# Patient Record
Sex: Female | Born: 1960 | Race: White | Hispanic: No | Smoking: Never smoker
Health system: Southern US, Community
[De-identification: ages and names within clinical notes are randomized; demographics above are authoritative.]

## PROBLEM LIST (undated history)

## (undated) ENCOUNTER — Emergency Department (HOSPITAL_COMMUNITY): Discharge status: Against Medical Advice | Payer: Self-pay

## (undated) DIAGNOSIS — D649 Anemia, unspecified: Secondary | ICD-10-CM

## (undated) DIAGNOSIS — K219 Gastro-esophageal reflux disease without esophagitis: Secondary | ICD-10-CM

## (undated) DIAGNOSIS — Z5189 Encounter for other specified aftercare: Secondary | ICD-10-CM

## (undated) DIAGNOSIS — R55 Syncope and collapse: Secondary | ICD-10-CM

## (undated) DIAGNOSIS — M1990 Unspecified osteoarthritis, unspecified site: Secondary | ICD-10-CM

## (undated) DIAGNOSIS — T7840XA Allergy, unspecified, initial encounter: Secondary | ICD-10-CM

## (undated) DIAGNOSIS — J45909 Unspecified asthma, uncomplicated: Secondary | ICD-10-CM

## (undated) DIAGNOSIS — G2581 Restless legs syndrome: Secondary | ICD-10-CM

## (undated) DIAGNOSIS — Z87442 Personal history of urinary calculi: Secondary | ICD-10-CM

## (undated) DIAGNOSIS — G43909 Migraine, unspecified, not intractable, without status migrainosus: Secondary | ICD-10-CM

## (undated) DIAGNOSIS — M7989 Other specified soft tissue disorders: Secondary | ICD-10-CM

## (undated) DIAGNOSIS — N200 Calculus of kidney: Secondary | ICD-10-CM

## (undated) HISTORY — DX: Anemia, unspecified: D64.9

## (undated) HISTORY — DX: Syncope and collapse: R55

## (undated) HISTORY — DX: Allergy, unspecified, initial encounter: T78.40XA

## (undated) HISTORY — DX: Unspecified asthma, uncomplicated: J45.909

## (undated) HISTORY — DX: Encounter for other specified aftercare: Z51.89

## (undated) HISTORY — PX: OTHER SURGICAL HISTORY: SHX169

## (undated) HISTORY — PX: MULTIPLE TOOTH EXTRACTIONS: SHX2053

## (undated) HISTORY — PX: COLONOSCOPY: SHX174

## (undated) HISTORY — PX: TUBAL LIGATION: SHX77

---

## 2002-06-01 ENCOUNTER — Encounter: Payer: Self-pay | Admitting: Emergency Medicine

## 2002-06-01 ENCOUNTER — Emergency Department (HOSPITAL_COMMUNITY): Admission: EM | Admit: 2002-06-01 | Discharge: 2002-06-01 | Payer: Self-pay | Admitting: Emergency Medicine

## 2006-06-18 ENCOUNTER — Ambulatory Visit (HOSPITAL_COMMUNITY): Admission: RE | Admit: 2006-06-18 | Discharge: 2006-06-18 | Payer: Self-pay | Admitting: Obstetrics & Gynecology

## 2012-10-22 ENCOUNTER — Observation Stay (HOSPITAL_COMMUNITY): Payer: Self-pay

## 2012-10-22 ENCOUNTER — Observation Stay (HOSPITAL_COMMUNITY)
Admission: EM | Admit: 2012-10-22 | Discharge: 2012-10-23 | Disposition: A | Discharge status: Home / Self Care | Payer: MEDICAID | Attending: Internal Medicine | Admitting: Internal Medicine

## 2012-10-22 ENCOUNTER — Encounter (HOSPITAL_COMMUNITY): Payer: Self-pay | Admitting: *Deleted

## 2012-10-22 ENCOUNTER — Emergency Department (HOSPITAL_COMMUNITY): Payer: Self-pay

## 2012-10-22 DIAGNOSIS — R079 Chest pain, unspecified: Secondary | ICD-10-CM

## 2012-10-22 DIAGNOSIS — Z79899 Other long term (current) drug therapy: Secondary | ICD-10-CM | POA: Insufficient documentation

## 2012-10-22 DIAGNOSIS — R200 Anesthesia of skin: Secondary | ICD-10-CM | POA: Diagnosis present

## 2012-10-22 DIAGNOSIS — R0989 Other specified symptoms and signs involving the circulatory and respiratory systems: Secondary | ICD-10-CM | POA: Insufficient documentation

## 2012-10-22 DIAGNOSIS — R202 Paresthesia of skin: Secondary | ICD-10-CM

## 2012-10-22 DIAGNOSIS — R209 Unspecified disturbances of skin sensation: Secondary | ICD-10-CM | POA: Insufficient documentation

## 2012-10-22 DIAGNOSIS — R0609 Other forms of dyspnea: Secondary | ICD-10-CM | POA: Insufficient documentation

## 2012-10-22 DIAGNOSIS — G43909 Migraine, unspecified, not intractable, without status migrainosus: Secondary | ICD-10-CM | POA: Diagnosis present

## 2012-10-22 DIAGNOSIS — M542 Cervicalgia: Secondary | ICD-10-CM | POA: Insufficient documentation

## 2012-10-22 HISTORY — DX: Unspecified osteoarthritis, unspecified site: M19.90

## 2012-10-22 HISTORY — DX: Migraine, unspecified, not intractable, without status migrainosus: G43.909

## 2012-10-22 LAB — POCT I-STAT, CHEM 8
BUN: 21 mg/dL (ref 6–23)
Calcium, Ion: 1.18 mmol/L (ref 1.12–1.23)
Hemoglobin: 12.6 g/dL (ref 12.0–15.0)
Potassium: 3.5 mEq/L (ref 3.5–5.1)
Sodium: 141 mEq/L (ref 135–145)
TCO2: 24 mmol/L (ref 0–100)

## 2012-10-22 LAB — CBC
HCT: 36.9 % (ref 36.0–46.0)
Hemoglobin: 12.3 g/dL (ref 12.0–15.0)
MCV: 92 fL (ref 78.0–100.0)
Platelets: 174 10*3/uL (ref 150–400)
RBC: 4.01 MIL/uL (ref 3.87–5.11)
RBC: 4.02 MIL/uL (ref 3.87–5.11)
RDW: 14.1 % (ref 11.5–15.5)
RDW: 14.1 % (ref 11.5–15.5)
WBC: 6.5 10*3/uL (ref 4.0–10.5)
WBC: 4.8 10*3/uL (ref 4.0–10.5)

## 2012-10-22 LAB — TROPONIN I
Troponin I: <0.3 ng/mL (ref <0.30)
Troponin I: <0.3 ng/mL (ref <0.30)
Troponin I: <0.3 ng/mL (ref <0.30)
Troponin I: <0.3 ng/mL (ref <0.30)

## 2012-10-22 LAB — COMPREHENSIVE METABOLIC PANEL
ALT: 11 U/L (ref 0–35)
Alkaline Phosphatase: 115 U/L (ref 39–117)
CO2: 25 mEq/L (ref 19–32)
Chloride: 102 mEq/L (ref 96–112)
Creatinine, Ser: 1.07 mg/dL (ref 0.50–1.10)
GFR calc Af Amer: 68 mL/min — ABNORMAL LOW (ref >90)
Glucose, Bld: 75 mg/dL (ref 70–99)
Potassium: 3.5 mEq/L (ref 3.5–5.1)
Sodium: 137 mEq/L (ref 135–145)
Total Bilirubin: 0.3 mg/dL (ref 0.3–1.2)
Total Protein: 7 g/dL (ref 6.0–8.3)

## 2012-10-22 LAB — CREATININE, SERUM
Creatinine, Ser: 0.98 mg/dL (ref 0.50–1.10)
GFR calc Af Amer: 76 mL/min — ABNORMAL LOW (ref >90)
GFR calc non Af Amer: 65 mL/min — ABNORMAL LOW (ref >90)

## 2012-10-22 LAB — TSH: TSH: 0.577 u[IU]/mL (ref 0.350–4.500)

## 2012-10-22 LAB — D-DIMER, QUANTITATIVE: D-Dimer, Quant: <0.27 ug/mL-FEU (ref 0.00–0.48)

## 2012-10-22 MED ORDER — TOPIRAMATE 100 MG PO TABS
200.0000 mg | ORAL_TABLET | Freq: Every day | ORAL | Status: DC
  Administered 2012-10-22: 200 mg via ORAL
  Filled 2012-10-22 (×2): qty 2

## 2012-10-22 MED ORDER — ONDANSETRON HCL 4 MG PO TABS: 4.0000 mg | ORAL_TABLET | Freq: Four times a day (QID) | ORAL | Status: DC | PRN

## 2012-10-22 MED ORDER — ONDANSETRON HCL 4 MG/2ML IJ SOLN
4.0000 mg | Freq: Once | INTRAMUSCULAR | Status: AC
  Administered 2012-10-22: 4 mg via INTRAVENOUS
  Filled 2012-10-22: qty 2

## 2012-10-22 MED ORDER — MELATONIN 5 MG PO TABS: 1.0000 | ORAL_TABLET | Freq: Every evening | ORAL | Status: DC | PRN

## 2012-10-22 MED ORDER — ENOXAPARIN SODIUM 40 MG/0.4ML ~~LOC~~ SOLN
40.0000 mg | SUBCUTANEOUS | Status: DC
  Administered 2012-10-22 – 2012-10-23 (×2): 40 mg via SUBCUTANEOUS
  Filled 2012-10-22 (×2): qty 0.4

## 2012-10-22 MED ORDER — ONDANSETRON HCL 4 MG/2ML IJ SOLN: 4.0000 mg | Freq: Four times a day (QID) | INTRAMUSCULAR | Status: DC | PRN

## 2012-10-22 MED ORDER — OXYCODONE-ACETAMINOPHEN 5-325 MG PO TABS
2.0000 | ORAL_TABLET | Freq: Four times a day (QID) | ORAL | Status: DC | PRN
  Administered 2012-10-22 – 2012-10-23 (×3): 2 via ORAL
  Filled 2012-10-22 (×3): qty 2

## 2012-10-22 MED ORDER — ACETAMINOPHEN 325 MG PO TABS: 650.0000 mg | ORAL_TABLET | Freq: Four times a day (QID) | ORAL | Status: DC | PRN

## 2012-10-22 MED ORDER — OXYCODONE-ACETAMINOPHEN 10-650 MG PO TABS: 1.0000 | ORAL_TABLET | Freq: Four times a day (QID) | ORAL | Status: DC | PRN

## 2012-10-22 MED ORDER — ACETAMINOPHEN 650 MG RE SUPP: 650.0000 mg | Freq: Four times a day (QID) | RECTAL | Status: DC | PRN

## 2012-10-22 MED ORDER — NITROGLYCERIN 0.4 MG SL SUBL
0.4000 mg | SUBLINGUAL_TABLET | SUBLINGUAL | Status: DC | PRN
  Administered 2012-10-22: 0.4 mg via SUBLINGUAL
  Filled 2012-10-22: qty 25

## 2012-10-22 MED ORDER — SODIUM CHLORIDE 0.9 % IJ SOLN
3.0000 mL | Freq: Two times a day (BID) | INTRAMUSCULAR | Status: DC
  Administered 2012-10-22 – 2012-10-23 (×2): 3 mL via INTRAVENOUS

## 2012-10-22 MED ORDER — ASPIRIN 81 MG PO CHEW
324.0000 mg | CHEWABLE_TABLET | Freq: Once | ORAL | Status: AC
  Administered 2012-10-22: 324 mg via ORAL
  Filled 2012-10-22: qty 4

## 2012-10-22 MED ORDER — MORPHINE SULFATE 4 MG/ML IJ SOLN
4.0000 mg | Freq: Once | INTRAMUSCULAR | Status: AC
  Administered 2012-10-22: 4 mg via INTRAVENOUS
  Filled 2012-10-22: qty 1

## 2012-10-22 MED ORDER — ASPIRIN EC 325 MG PO TBEC
325.0000 mg | DELAYED_RELEASE_TABLET | Freq: Every day | ORAL | Status: DC
  Administered 2012-10-22 – 2012-10-23 (×2): 325 mg via ORAL
  Filled 2012-10-22 (×2): qty 1

## 2012-10-22 MED ORDER — SODIUM CHLORIDE 0.9 % IJ SOLN
3.0000 mL | Freq: Two times a day (BID) | INTRAMUSCULAR | Status: DC
  Administered 2012-10-22: 3 mL via INTRAVENOUS

## 2012-10-22 NOTE — Progress Notes (Signed)
PHARMACIST - PHYSICIAN ORDER COMMUNICATION  CONCERNING: P&T Medication Policy on Herbal Medications  DESCRIPTION:  This patient's order for:  Melatonin has been noted.  This product(s) is classified as an "herbal" or natural product. Due to a lack of definitive safety studies or FDA approval, nonstandard manufacturing practices, plus the potential risk of unknown drug-drug interactions while on inpatient medications, the Pharmacy and Therapeutics Committee does not permit the use of "herbal" or natural products of this type within [facility].   ACTION TAKEN: The pharmacy department is unable to verify this order at this time and your patient has been informed of this safety policy. Please reevaluate patient's clinical condition at discharge and address if the herbal or natural product(s) should be resumed at that time.  Loralee Pacas, PharmD, BCPS 10/22/2012 8:31 AM

## 2012-10-22 NOTE — H&P (Signed)
[facility] History and Physical  MARITTA KIEF XBJ:[id number] DOB: 07-01-1961 DOA: 10/22/2012  Referring physician: Dr. Marnette Burgess. PCP: No primary provider on file.  Specialists: None.  Chief Complaint: Chest pain.  HPI: [name] [name] is a 52 y.o. female with history of migraine started having chest pain early this morning while sleeping at 3 AM. The pain was retrosternal felt like pressure. Denies any associated shortness of breath diaphoresis nausea vomiting. Since the pain was persistent and associated with right hand numbness patient came to the ER. Pain improved and resolved with nitroglycerin sublingual and patient has been admitted for further management. Presently chest pain-free. EKG shows sinus bradycardia and cardiac enzymes were negative chest x-ray was unremarkable. Patient states that over the last 2 days patient has been having some neck pain with right hand numbness. Denies any trauma or fall. Denies any blurred vision difficulty speaking or weakness of upper or lower extremities.  Review of Systems: As presented in the history of presenting illness, rest negative.  Past Medical History  Diagnosis Date  . Kidney stone   . Arthritis   . Migraine    History reviewed. No pertinent past surgical history. Social History:  reports that she has never smoked. She does not have any smokeless tobacco history on file. She reports that she does not drink alcohol or use illicit drugs. Levels at home. where does live-- Can do ADLs. Can patient participate in ADLs?  No Known Allergies  Family History  Problem Relation Age of Onset  . Hypertension Mother   . Diabetes Mellitus II Mother   . Hypertension Father   . Diabetes Mellitus II Father       Prior to Admission medications   Medication Sig Start Date End Date Taking? Authorizing Provider  Melatonin 5 MG TABS Take 1 tablet by mouth at bedtime as needed.   Yes Historical Provider, MD  oxyCODONE-acetaminophen  (PERCOCET) 10-650 MG per tablet Take 1 tablet by mouth every 6 (six) hours as needed for pain (and at night for leg pain).   Yes Historical Provider, MD  topiramate (TOPAMAX) 200 MG tablet Take 200 mg by mouth at bedtime.    Yes Historical Provider, MD   Physical Exam: Filed Vitals:   10/22/12 0542 10/22/12 0600  BP: 126/79 112/65  Pulse: 56 54  Resp: 19 13  SpO2: 98% 98%     General:  Well-developed and nourished.  Eyes: Anicteric no pallor.  ENT: No discharge from the ears eyes nose and mouth  Neck: No mass felt no tenderness.  Cardiovascular: S1-S2 heard.  Respiratory: No rhonchi no crepitations.  Abdomen: Soft nontender bowel sounds present.  Skin: No rash.  Musculoskeletal: No edema.  Psychiatric: Appears normal.  Neurologic: Alert awake oriented to time place and person. Moves all extremities 5 x 5. No facial asymmetry. Good grip strength and no pronator drift.  Labs on Admission:  Basic Metabolic Panel:  Recent Labs Lab 10/22/12 0450 10/22/12 0458  NA 137 141  K 3.5 3.5  CL 102 108  CO2 25  --   GLUCOSE 75 76  BUN 20 21  CREATININE 1.07 1.10  CALCIUM 9.1  --    Liver Function Tests:  Recent Labs Lab 10/22/12 0450  AST 16  ALT 11  ALKPHOS 115  BILITOT 0.3  PROT 7.0  ALBUMIN 3.7   No results found for this basename: LIPASE, AMYLASE,  in the last 168 hours No results found for this basename: AMMONIA,  in the last  168 hours CBC:  Recent Labs Lab 10/22/12 0450 10/22/12 0458  WBC 6.5  --   HGB 12.3 12.6  HCT 36.9 37.0  MCV 92.0  --   PLT 183  --    Cardiac Enzymes:  Recent Labs Lab 10/22/12 0450  TROPONINI <0.30    BNP (last 3 results) No results found for this basename: PROBNP,  in the last 8760 hours CBG: No results found for this basename: GLUCAP,  in the last 168 hours  Radiological Exams on Admission: Dg Chest Portable 1 View  10/22/2012  *RADIOLOGY REPORT*  Clinical Data: Mid chest pain.  PORTABLE CHEST - 1 VIEW   Comparison: None.  Findings: Shallow inspiration.  Borderline heart size with normal pulmonary vascularity.  No focal consolidation in the lungs.  No blunting of costophrenic angles.  No pneumothorax.  Mediastinal contours appear intact.  Azygos lobe.  Esophageal hiatal hernia behind the heart.  IMPRESSION: No evidence of active pulmonary disease.   Original Report Authenticated By: Lucienne Capers, M.D.     EKG: Independently reviewed. Sinus bradycardia.  Assessment/Plan Principal Problem:   Chest pain Active Problems:   Migraine   1. Chest pain to rule out ACS - cycle cardiac markers and check d-dimer. Aspirin. Nitroglycerin when necessary. 2. Right hand numbness with neck pain - check MRI C-spine. 3. History of migraine - continue present medications.    Code Status: Full code.  Family Communication: None.  Disposition Plan: Admit for observation.    , N. [facility] Pager [phone].  If 7PM-7AM, please contact night-coverage [email or web address] Password [facility] 10/22/2012, 6:37 AM

## 2012-10-22 NOTE — Consult Note (Signed)
Reason for Consult: Right hand numbness Referring Physician: Niel Hummer  CC: arm numbness  History is obtained from:Patient   HPI: [name] [name] is a 52 y.o. female who had transient right arm numbness yesterday, but then awoke this morning with right arm numbness that has been persistent. She states that now it seems to invovle the ulnar aspect of her hand more than the radial aspect, however her whole hand is numb. This is been persistent since the start this morning.   Of note, she is under a lot of stress at home lately. She also had a lot of difficulty with chest pressure this morning, she states that it felt like someone was sitting on her chest.  Of note, in the past she has had frequent episodes of waking up in the night with numb hands, but it typically   LKW: 03/20 prior to bed tpa given: no, delay in arrival  ROS: A 14 point ROS was performed and is negative except as noted in the HPI.  Past Medical History  Diagnosis Date  . Kidney stone   . Arthritis   . Migraine     Family History: Remote family hx stroke  Social History: Tob: -  Exam: Current vital signs: BP 111/64  Pulse 61  Temp(Src) 97.2 F (36.2 C) (Oral)  Resp 16  Ht 5' 4"  (1.626 m)  Wt 76.318 kg (168 lb 4 oz)  BMI 28.87 kg/m2  SpO2 100%  LMP 06/24/2012 Vital signs in last 24 hours: Temp:  [97.2 F (36.2 C)-97.6 F (36.4 C)] 97.2 F (36.2 C) (03/21 1352) Pulse Rate:  [53-61] 61 (03/21 1352) Resp:  [13-19] 16 (03/21 1352) BP: (111-126)/(64-79) 111/64 mmHg (03/21 1352) SpO2:  [98 %-100 %] 100 % (03/21 1352) Weight:  [76.318 kg (168 lb 4 oz)] 76.318 kg (168 lb 4 oz) (03/21 0811)  General: in bed, NAD CV: RRR Mental Status: Patient is awake, alert, oriented to person, place, month, year, and situation. Immediate and remote memory are intact Patient is able to give a clear and coherent history. Cranial Nerves: II: Visual Fields are full. Pupils are equal, round, and reactive to  light.  Discs are difficult to visualize. III,IV, VI: EOMI without ptosis or diploplia.  V: Facial sensation is symmetric to temperature VII: Facial movement is symmetric.  VIII: hearing is intact to voice X: Uvula elevates symmetrically XI: Shoulder shrug is symmetric. XII: tongue is midline without atrophy or fasciculations.  Motor: Tone is normal. Bulk is normal. 5/5 strength was present in all four extremities.  Sensory: Sensation is reduced in the right arm below the elbow to vibration, touch, and temperature circumferentially. She has decreased but present propriocetion. When asked to close her eyes, she is unable to identify when she is touched even when enough force is applied to make her whole body sway slightly. She is able to perform FNF with her eyes closed.  Deep Tendon Reflexes: 2+ and symmetric in the biceps, triceps, and patellae.  Cerebellar: FNF  intact bilaterally Gait: Patient has a stable casual gait.   I have reviewed labs in epic and the results pertinent to this consultation are: CMP, CBC WNL  I have reviewed the images obtained:MRI C-spine no clear spinal or foraminal stenosis.   Impression: 52 yo F with right arm numbness without weakness. Given the lack of weakness coupled with this degree of numbness, peripheral etiology is relatively unlikely.  A thalamic infarct is possible, but in that case, the preserved proprioception would  be unusual. There is a possibility that this could be a conversion reaction, but this is a diagnosis of exclusion.   If her Brain MRI is negative, then I would have her follow up as an outpatient for consideration of an EMG.   Recommendations: 1) MRI brain to rule out infarct 2) EMG as outpatient if MRI is negative.   Roland Rack, MD [facility] [phone]  If 7pm- 7am, please page neurology on call at [phone].

## 2012-10-22 NOTE — Progress Notes (Signed)
Patient seen and examined. She relates another episode of SOB, on exertion. Denies chest pain. She is also still complaining of right arm numbness, started when she wake up this am around 3 am. She has had prior episodes on and off. MRI was negative for right side foraminal stenosis.  1-SOB/Chest pressure: D dimer negative. First 2 set cardiac enzymes negative. Will order ECHO. Oxygen sat on ambulation.  2-Right hand numbness, decrease sensation. MRI negative for foraminal stenosis. Will order B 12. TSH pending. Will consult neurology. She has had numbness on and  Off.    , Md.

## 2012-10-22 NOTE — Progress Notes (Signed)
Nutrition Brief Note  Patient identified on the Malnutrition Screening Tool (MST) Report  Body mass index is 28.87 kg/(m^2). Patient meets criteria for Overweight based on current BMI. Pt states her usual body weight is 160 lbs; pt was weighed at 168 lbs 10/22/12.  Current diet order is Heart Healthy, patient is consuming approximately 50% of meals at this time. Pt states she has a decreased appetite today but, was eating well PTA with 3 meals daily. Encouraged pt to consume >75% of meals. Expect good compliance. Labs and medications reviewed. No nutrition interventions warranted at this time. If nutrition issues arise, please consult RD.   Ian Malkin RD, LDN Inpatient Clinical Dietitian Pager: [phone] After Hours Pager: [phone]

## 2012-10-22 NOTE — ED Provider Notes (Signed)
History     CSN: [id number]  Arrival date & time 10/22/12  4098   First MD Initiated Contact with Patient 10/22/12 [phone]      Chief Complaint  Patient presents with  . Chest Pain  . Numbness    (Consider location/radiation/quality/duration/timing/severity/associated sxs/prior treatment) HPI HX per PT some CP yesterday and recurrent tonight at rest, heavieness across her chest, associated SOB and tingling in her arms, no cough, fever, leg swelling, nausea or diaphoresis, has strong FH of CAD but no personal h/o same. No known aggrevating or alleviating factors.  Past Medical History  Diagnosis Date  . Kidney stone   . Arthritis     No past surgical history on file.  No family history on file.  History  Substance Use Topics  . Smoking status: Never Smoker   . Smokeless tobacco: Not on file  . Alcohol Use: No    OB History   Grav Para Term Preterm Abortions TAB SAB Ect Mult Living                  Review of Systems  Constitutional: Negative for fever and chills.  HENT: Negative for neck pain and neck stiffness.   Eyes: Negative for pain.  Respiratory: Positive for shortness of breath. Negative for cough.   Cardiovascular: Positive for chest pain.  Gastrointestinal: Negative for vomiting and abdominal pain.  Genitourinary: Negative for dysuria.  Musculoskeletal: Negative for back pain.  Skin: Negative for rash.  Neurological: Negative for headaches.  All other systems reviewed and are negative.    Allergies  Review of patient's allergies indicates not on file.  Home Medications  No current outpatient prescriptions on file.  BP 126/79  Pulse 56  Resp 19  SpO2 98%  LMP 06/24/2012  Physical Exam  Constitutional: She is oriented to person, place, and time. She appears well-developed and well-nourished.  HENT:  Head: Normocephalic and atraumatic.  Eyes: Conjunctivae and EOM are normal. Pupils are equal, round, and reactive to light.  Neck: Trachea  normal. Neck supple. No thyromegaly present.  Cardiovascular: Normal rate, regular rhythm, S1 normal, S2 normal and normal pulses.     No systolic murmur is present   No diastolic murmur is present  Pulses:      Radial pulses are 2+ on the right side, and 2+ on the left side.  Pulmonary/Chest: Effort normal and breath sounds normal. She has no wheezes. She has no rhonchi. She has no rales. She exhibits no tenderness.  Abdominal: Soft. Normal appearance and bowel sounds are normal. There is no tenderness. There is no CVA tenderness and negative Murphy's sign.  Musculoskeletal:  BLE:s Calves nontender, no cords or erythema, negative Homans sign  Neurological: She is alert and oriented to person, place, and time. She has normal strength. No cranial nerve deficit or sensory deficit. GCS eye subscore is 4. GCS verbal subscore is 5. GCS motor subscore is 6.  Skin: Skin is warm and dry. No rash noted. She is not diaphoretic.  Psychiatric: Her speech is normal.  Cooperative and appropriate    ED Course  Procedures (including critical care time)  Results for orders placed during the hospital encounter of 10/22/12  CBC      Result Value Range   WBC 6.5  4.0 - 10.5 K/uL   RBC 4.01  3.87 - 5.11 MIL/uL   Hemoglobin 12.3  12.0 - 15.0 g/dL   HCT 47.8  29.5 - 62.1 %   MCV 92.0  78.0 -  100.0 fL   MCH 30.7  26.0 - 34.0 pg   MCHC 33.3  30.0 - 36.0 g/dL   RDW 16.1  09.6 - 04.5 %   Platelets 183  150 - 400 K/uL  COMPREHENSIVE METABOLIC PANEL      Result Value Range   Sodium 137  135 - 145 mEq/L   Potassium 3.5  3.5 - 5.1 mEq/L   Chloride 102  96 - 112 mEq/L   CO2 25  19 - 32 mEq/L   Glucose, Bld 75  70 - 99 mg/dL   BUN 20  6 - 23 mg/dL   Creatinine, Ser 4.09  0.50 - 1.10 mg/dL   Calcium 9.1  8.4 - 81.1 mg/dL   Total Protein 7.0  6.0 - 8.3 g/dL   Albumin 3.7  3.5 - 5.2 g/dL   AST 16  0 - 37 U/L   ALT 11  0 - 35 U/L   Alkaline Phosphatase 115  39 - 117 U/L   Total Bilirubin 0.3  0.3 - 1.2  mg/dL   GFR calc non Af Amer 59 (*) >90 mL/min   GFR calc Af Amer 68 (*) >90 mL/min  POCT I-STAT, CHEM 8      Result Value Range   Sodium 141  135 - 145 mEq/L   Potassium 3.5  3.5 - 5.1 mEq/L   Chloride 108  96 - 112 mEq/L   BUN 21  6 - 23 mg/dL   Creatinine, Ser 9.14  0.50 - 1.10 mg/dL   Glucose, Bld 76  70 - 99 mg/dL   Calcium, Ion 7.82  9.56 - 1.23 mmol/L   TCO2 24  0 - 100 mmol/L   Hemoglobin 12.6  12.0 - 15.0 g/dL   HCT 21.3  08.6 - 57.8 %   Dg Chest Portable 1 View  10/22/2012  *RADIOLOGY REPORT*  Clinical Data: Mid chest pain.  PORTABLE CHEST - 1 VIEW  Comparison: None.  Findings: Shallow inspiration.  Borderline heart size with normal pulmonary vascularity.  No focal consolidation in the lungs.  No blunting of costophrenic angles.  No pneumothorax.  Mediastinal contours appear intact.  Azygos lobe.  Esophageal hiatal hernia behind the heart.  IMPRESSION: No evidence of active pulmonary disease.   Original Report Authenticated By: Burman Nieves, M.D.     Troponin 0.01 WNL   Date: 10/22/2012  Rate: 53  Rhythm: normal sinus rhythm  QRS Axis: normal  Intervals: normal  ST/T Wave abnormalities: nonspecific ST changes  Conduction Disutrbances:none  Narrative Interpretation:   Old EKG Reviewed: none available  ASA, NTG, IV morphine  Pain resolved. MED consult, d/w [facility] DR Toniann Fail will admit MDM  CP  Evaluated with ECG, labs and CXR  Pain improved with NTG and morphine  MED admit        Sunnie Nielsen, MD 10/22/12 [phone]

## 2012-10-22 NOTE — Care Management Note (Signed)
    Page 1 of 1   10/22/2012     1:38:05 PM   CARE MANAGEMENT NOTE 10/22/2012  Patient:  [name] [name], [name] [name]   Account Number:  [id number]  Date Initiated:  10/22/2012  Documentation initiated by:  Lanier Clam  Subjective/Objective Assessment:   ADMITTED W/CHEST PAIN.     Action/Plan:   FROM HOME.   Anticipated DC Date:  10/23/2012   Anticipated DC Plan:  HOME/SELF CARE      DC Planning Services  CM consult      Choice offered to / List presented to:             Status of service:  In process, will continue to follow Medicare Important Message given?   (If response is "NO", the following Medicare IM given date fields will be blank) Date Medicare IM given:   Date Additional Medicare IM given:    Discharge Disposition:    Per UR Regulation:  Reviewed for med. necessity/level of care/duration of stay  If discussed at Long Length of Stay Meetings, dates discussed:    Comments:  10/22/12 [facility] RN,BSN [phone]

## 2012-10-22 NOTE — ED Notes (Signed)
Pt reports she awoke around 3 am with Chest Pain. She also reports she had some numbness in her fingers yesterday that subsided, and this morning when she woke up she has been experiencing numbness in bilateral hands.

## 2012-10-22 NOTE — ED Notes (Signed)
Report given to Susan 

## 2012-10-23 DIAGNOSIS — I059 Rheumatic mitral valve disease, unspecified: Secondary | ICD-10-CM

## 2012-10-23 LAB — CBC
Hemoglobin: 11.8 g/dL — ABNORMAL LOW (ref 12.0–15.0)
MCH: 31 pg (ref 26.0–34.0)
MCHC: 33.4 g/dL (ref 30.0–36.0)
RBC: 3.81 MIL/uL — ABNORMAL LOW (ref 3.87–5.11)
RDW: 14.4 % (ref 11.5–15.5)

## 2012-10-23 LAB — BASIC METABOLIC PANEL
BUN: 18 mg/dL (ref 6–23)
Calcium: 9.1 mg/dL (ref 8.4–10.5)
Creatinine, Ser: 0.98 mg/dL (ref 0.50–1.10)
GFR calc Af Amer: 76 mL/min — ABNORMAL LOW (ref >90)
GFR calc non Af Amer: 65 mL/min — ABNORMAL LOW (ref >90)
Glucose, Bld: 96 mg/dL (ref 70–99)
Sodium: 138 mEq/L (ref 135–145)

## 2012-10-23 MED ORDER — ASPIRIN 81 MG PO TBEC: 81.0000 mg | DELAYED_RELEASE_TABLET | Freq: Every day | ORAL | Status: DC

## 2012-10-23 NOTE — Progress Notes (Signed)
Subjective: Numbness almost completely resolved with the exception of her pinky finger.   Exam: Filed Vitals:   10/23/12 0611  BP:   Pulse: 85  Temp:   Resp:    Gen: In bed, NAD MS: Awake, Alert appropriate UD:APTC, fixates and tracks Motor: full strength in bialteral interossei and hand extensors.  Sensory:mild decrease on ulnar aspect of right hand.   Impression: 52 yo F with resolving arm numbness of unclear etiology. I feel that there emay be a possibility of conversion reaction, but this is not certain. She does not have a stroke or C-Spine abnormality that could be explaining her symptoms. She has a history of hand numbness at night that could be CTS and therefore wrist splints may be helpful.   Recommendations: 1)Wrist splints at night 2) If numbness remains bothersome, or returns or worsens, then she could follow up with outpatient neurology for an EMG, however at this time, I do not feel that it is necessary based on the fact that she has no weakness and continues to improve.  3) Neurology will sign off at this time, please call with further questions.   Roland Rack, MD [facility] [phone]  If 7pm- 7am, please page neurology on call at [phone].

## 2012-10-23 NOTE — Progress Notes (Signed)
Utilization review complete 

## 2012-10-23 NOTE — Discharge Summary (Signed)
Physician Discharge Summary  [name] [name] FXO:[id number] DOB: Jun 15, 1961 DOA: 10/22/2012  PCP: No primary provider on file.  Admit date: 10/22/2012 Discharge date: 10/23/2012  Time spent: 30 minutes  Recommendations for Outpatient Follow-up:  1. Follow up with Neurology for EMG if symptoms recours or worsen. 2. Follow up with PCP for referral for out patient stress test.   Discharge Diagnoses:    Chest pain, dyspnea, resolved.    Migraine   Numbness and tingling in hands   Discharge Condition: stable  Diet recommendation: Heart Healthy  Filed Weights   10/22/12 0811  Weight: 76.318 kg (168 lb 4 oz)    History of present illness:  [name] [name] is a 52 y.o. female with history of migraine started having chest pain early this morning while sleeping at 3 AM. The pain was retrosternal felt like pressure. Denies any associated shortness of breath diaphoresis nausea vomiting. Since the pain was persistent and associated with right hand numbness patient came to the ER. Pain improved and resolved with nitroglycerin sublingual and patient has been admitted for further management. Presently chest pain-free. EKG shows sinus bradycardia and cardiac enzymes were negative chest x-ray was unremarkable. Patient states that over the last 2 days patient has been having some neck pain with right hand numbness. Denies any trauma or fall. Denies any blurred vision difficulty speaking or weakness of upper or lower extremities.   Hospital Course:   1-SOB/Chest pressure: unclear etiology. Patient admitted with chest pressure. No more episodes during this admission. D dimer negative. 3 set cardiac enzymes negative. ECHO result pending if no significant abnormalities patient will be discharge home today. She will need to follow up with PCP to have referral to cardiologist for outpatient stress test.   2-Right hand numbness, decrease sensation. MRI negative for foraminal stenosis. B 12 normal at 502.Marland Kitchen  TSH 0.55. MRI brain negative for acute stroke. Appreciate Dr Leonides Schanz evaluation. He think patient symptoms could be related to conversion reaction, vs component carpal tunnel syndrome. Patient was advised if symptoms reoccurs to follow up with [facility] for EMG.   Procedures:  ECHO : pending  Consultations:  Dr Leonides Schanz, neuro  Discharge Exam: Filed Vitals:   10/22/12 1352 10/22/12 2242 10/23/12 0509 10/23/12 0611  BP: 111/64 100/55 103/60   Pulse: 61 61 61 85  Temp: 97.2 F (36.2 C) 98 F (36.7 C) 97.5 F (36.4 C)   TempSrc: Oral Oral Oral   Resp: 16 16 16    Height:      Weight:      SpO2: 100% 98% 99% 98%    General: No distress. Cardiovascular: S 1, S 2 RRR Respiratory: CTA Neuro exam non focal  Discharge Instructions  Discharge Orders   Future Orders Complete By Expires     Diet - low sodium heart healthy  As directed     Increase activity slowly  As directed         Medication List    TAKE these medications       aspirin 81 MG EC tablet  Commonly known as:  aspirin EC  Take 1 tablet (81 mg total) by mouth daily. Swallow whole.     Melatonin 5 MG Tabs  Take 1 tablet by mouth at bedtime as needed.     oxyCODONE-acetaminophen 10-650 MG per tablet  Commonly known as:  PERCOCET  Take 1 tablet by mouth every 6 (six) hours as needed for pain (and at night for leg pain).  topiramate 200 MG tablet  Commonly known as:  TOPAMAX  Take 200 mg by mouth at bedtime.           Follow-up Information   Please follow up. (follow up with PCP in 1 week. Follow up with [facility] neurology as needed  [phone])        The results of significant diagnostics from this hospitalization (including imaging, microbiology, ancillary and laboratory) are listed below for reference.    Significant Diagnostic Studies: Mr Brain Wo Contrast  10/22/2012  *RADIOLOGY REPORT*  Clinical Data: Right arm numbness  MRI HEAD WITHOUT CONTRAST  Technique:  Multiplanar,  multiecho pulse sequences of the brain and surrounding structures were obtained according to standard protocol without intravenous contrast.  Comparison: None  Findings: Ventricle size is normal.  Negative for Chiari malformation.  Pituitary normal in size.  Scattered small hyperintensities in the deep cerebral white matter bilaterally.  Brainstem and cerebellum are normal.  Diffusion weighted imaging is negative for acute infarct.  Negative for intracranial hemorrhage or fluid collection.  Negative for mass or edema.  No shift to the midline structures.  Paranasal sinuses are clear. Small effusion in the right mastoid tip.  IMPRESSION: Mild chronic white matter changes, most likely related to chronic microvascular ischemia.  No acute infarct or mass.   Original Report Authenticated By: Carl Best, M.D.    Mr Cervical Spine Wo Contrast  10/22/2012  *RADIOLOGY REPORT*  Clinical Data: New onset right arm pain.  Posterior neck pain. Right arm and finger numbness.  MRI CERVICAL SPINE WITHOUT CONTRAST  Technique:  Multiplanar and multiecho pulse sequences of the cervical spine, to include the craniocervical junction and cervicothoracic junction, were obtained according to standard protocol without intravenous contrast.  Comparison: None.  Findings: Mildly exaggerated cervical lordosis.  Vertebral body height and marrow signal is normal.  The cervical cord demonstrates normal caliber.  No intramedullary lesions.  Posterior fossa structures appear within normal limits.  There are flow voids present in both vertebral arteries.  Disc desiccation is present diffusely through the cervical spine. Axial images are degraded by motion artifact, particularly the T2 images.  C2-C3:  Negative.  C3-C4: Broad-based left eccentric disc protrusion is present.  Mild central stenosis without cord deformity.  Foramina grossly appear adequately patent allowing for motion artifact.  C4-C5:  Shallow disc osteophyte complex.  Central canal  and foramina appear patent.  C5-C6:  Shallow disc bulging.  No stenosis.  C6-C7:  Negative.  C7-T1:  Negative.  T1-T2:  Sagittal imaging only.  Tiny left paracentral protrusion without resulting stenosis.  On sagittal imaging, there is a 1 cm intraosseous lesion in the right T3 vertebral body, likely representing an atypical hemangioma with less fat than typically seen on the T1-weighted images.  There is no radiating bone marrow edema.  Internal inversion recovery signal is commonly associated with hemangioma.  Follow-up 50-monthto 663-monthRI recommended to assess for stability.  IMPRESSION: 1.  Mild cervical spondylosis, most pronounced at C3-C4.  Although the axial images are degraded by motion, there does not appear to be focal right-sided foraminal stenosis in this patient with right upper extremity radicular symptoms. 2.  1 cm lesion in the right T3 vertebra probably represents an atypical hemangioma.  Follow-up 3-77-month 6-m69-month of the thoracic spine recommended to assess for stability.   Original Report Authenticated By: GeofDereck LigasD.    Dg Chest Portable 1 View  10/22/2012  *RADIOLOGY REPORT*  Clinical Data: Mid chest pain.  PORTABLE CHEST - 1 VIEW  Comparison: None.  Findings: Shallow inspiration.  Borderline heart size with normal pulmonary vascularity.  No focal consolidation in the lungs.  No blunting of costophrenic angles.  No pneumothorax.  Mediastinal contours appear intact.  Azygos lobe.  Esophageal hiatal hernia behind the heart.  IMPRESSION: No evidence of active pulmonary disease.   Original Report Authenticated By: Lucienne Capers, M.D.     Microbiology: No results found for this or any previous visit (from the past 240 hour(s)).   Labs: Basic Metabolic Panel:  Recent Labs Lab 10/22/12 0450 10/22/12 0458 10/22/12 1220 10/23/12 0445  NA 137 141  --  138  K 3.5 3.5  --  3.7  CL 102 108  --  106  CO2 25  --   --  23  GLUCOSE 75 76  --  96  BUN 20 21  --  18   CREATININE 1.07 1.10 0.98 0.98  CALCIUM 9.1  --   --  9.1   Liver Function Tests:  Recent Labs Lab 10/22/12 0450  AST 16  ALT 11  ALKPHOS 115  BILITOT 0.3  PROT 7.0  ALBUMIN 3.7   CBC:  Recent Labs Lab 10/22/12 0450 10/22/12 0458 10/22/12 1220 10/23/12 0445  WBC 6.5  --  4.8 4.7  HGB 12.3 12.6 12.4 11.8*  HCT 36.9 37.0 36.6 35.3*  MCV 92.0  --  91.0 92.7  PLT 183  --  174 163   Cardiac Enzymes:  Recent Labs Lab 10/22/12 0450 10/22/12 1220 10/22/12 1440 10/22/12 2048  TROPONINI <0.30 <0.30 <0.30 <0.30   BNP: BNP (last 3 results) No results found for this basename: PROBNP,  in the last 8760 hours CBG: No results found for this basename: GLUCAP,  in the last 168 hours     Signed:  ,  [facility] 10/23/2012, 12:17 PM

## 2012-10-23 NOTE — Progress Notes (Signed)
  Echocardiogram 2D Echocardiogram has been performed.  ,  10/23/2012, 12:11 PM

## 2012-10-25 LAB — POCT I-STAT TROPONIN I: Troponin i, poc: 0.01 ng/mL (ref 0.00–0.08)

## 2012-11-20 ENCOUNTER — Emergency Department (HOSPITAL_COMMUNITY)
Admission: EM | Admit: 2012-11-20 | Discharge: 2012-11-20 | Disposition: A | Discharge status: Home / Self Care | Payer: Self-pay | Attending: Emergency Medicine | Admitting: Emergency Medicine

## 2012-11-20 ENCOUNTER — Encounter (HOSPITAL_COMMUNITY): Payer: Self-pay | Admitting: Emergency Medicine

## 2012-11-20 DIAGNOSIS — Z87442 Personal history of urinary calculi: Secondary | ICD-10-CM | POA: Insufficient documentation

## 2012-11-20 DIAGNOSIS — Z7982 Long term (current) use of aspirin: Secondary | ICD-10-CM | POA: Insufficient documentation

## 2012-11-20 DIAGNOSIS — M79609 Pain in unspecified limb: Secondary | ICD-10-CM | POA: Insufficient documentation

## 2012-11-20 DIAGNOSIS — Z8679 Personal history of other diseases of the circulatory system: Secondary | ICD-10-CM | POA: Insufficient documentation

## 2012-11-20 DIAGNOSIS — M129 Arthropathy, unspecified: Secondary | ICD-10-CM | POA: Insufficient documentation

## 2012-11-20 MED ORDER — IBUPROFEN 800 MG PO TABS
800.0000 mg | ORAL_TABLET | Freq: Once | ORAL | Status: AC
  Administered 2012-11-20: 800 mg via ORAL
  Filled 2012-11-20: qty 1

## 2012-11-20 MED ORDER — ACETAMINOPHEN 500 MG PO TABS
1000.0000 mg | ORAL_TABLET | Freq: Once | ORAL | Status: AC
  Administered 2012-11-20: 1000 mg via ORAL
  Filled 2012-11-20: qty 2

## 2012-11-20 NOTE — ED Notes (Signed)
Pt alert, arrives from visiting a pt upstairs, states developed pain to lower leg, denies trauma or injury, resp even unlabored, skin pwd

## 2012-11-20 NOTE — ED Provider Notes (Signed)
History     CSN: [id number]  Arrival date & time 11/20/12  0416   First MD Initiated Contact with Patient 11/20/12 [phone]      Chief Complaint  Patient presents with  . Leg Pain    (Consider location/radiation/quality/duration/timing/severity/associated sxs/prior treatment) HPI.... sharp pain right lower leg approximately one hour ago.  No trauma, posterior calf or thigh tenderness. Severity is mild. No radiation. No other symptoms  Past Medical History  Diagnosis Date  . Kidney stone   . Arthritis   . Migraine     History reviewed. No pertinent past surgical history.  Family History  Problem Relation Age of Onset  . Hypertension Mother   . Diabetes Mellitus II Mother   . Hypertension Father   . Diabetes Mellitus II Father     History  Substance Use Topics  . Smoking status: Never Smoker   . Smokeless tobacco: Not on file  . Alcohol Use: No    OB History   Grav Para Term Preterm Abortions TAB SAB Ect Mult Living                  Review of Systems  All other systems reviewed and are negative.    Allergies  Review of patient's allergies indicates no known allergies.  Home Medications   Current Outpatient Rx  Name  Route  Sig  Dispense  Refill  . aspirin 81 MG EC tablet   Oral   Take 81 mg by mouth every morning. Swallow whole.         . Melatonin 5 MG TABS   Oral   Take 1 tablet by mouth at bedtime as needed.         Marland Kitchen oxyCODONE-acetaminophen (PERCOCET) 10-650 MG per tablet   Oral   Take 1.5 tablets by mouth at bedtime as needed for pain (and at night for leg pain).          Marland Kitchen rOPINIRole (REQUIP) 0.5 MG tablet   Oral   Take 0.5-1.5 mg by mouth at bedtime as needed (restless legs).         . topiramate (TOPAMAX) 200 MG tablet   Oral   Take 200 mg by mouth at bedtime.            BP 135/81  Pulse 72  Temp(Src) 98 F (36.7 C) (Oral)  Resp 20  SpO2 98%  LMP 06/24/2012  Physical Exam  Constitutional: She is oriented to person,  place, and time. She appears well-developed and well-nourished.  HENT:  Head: Normocephalic.  Musculoskeletal:  Right lower extremity: Minimal tenderness in the proximal lateral right tibia area. Varicose veins noted.  Nontender on posterior thigh or calf  Neurological: She is alert and oriented to person, place, and time.  Skin: Skin is warm and dry.  Psychiatric: She has a normal mood and affect.    ED Course  Procedures (including critical care time)  Labs Reviewed - No data to display No results found.   1. Lower leg pain, right       MDM  No clinical evidence of DVT. Most likely source of pain is ruptured varicosity.        Donnetta Hutching, MD 11/20/12 [phone]

## 2012-12-28 ENCOUNTER — Emergency Department (HOSPITAL_COMMUNITY)
Admission: EM | Admit: 2012-12-28 | Discharge: 2012-12-28 | Disposition: A | Discharge status: Home / Self Care | Payer: Self-pay | Attending: Emergency Medicine | Admitting: Emergency Medicine

## 2012-12-28 ENCOUNTER — Encounter (HOSPITAL_COMMUNITY): Payer: Self-pay

## 2012-12-28 DIAGNOSIS — K219 Gastro-esophageal reflux disease without esophagitis: Secondary | ICD-10-CM | POA: Insufficient documentation

## 2012-12-28 DIAGNOSIS — Z7982 Long term (current) use of aspirin: Secondary | ICD-10-CM | POA: Insufficient documentation

## 2012-12-28 DIAGNOSIS — M129 Arthropathy, unspecified: Secondary | ICD-10-CM | POA: Insufficient documentation

## 2012-12-28 DIAGNOSIS — R1013 Epigastric pain: Secondary | ICD-10-CM | POA: Insufficient documentation

## 2012-12-28 DIAGNOSIS — R112 Nausea with vomiting, unspecified: Secondary | ICD-10-CM | POA: Insufficient documentation

## 2012-12-28 DIAGNOSIS — R42 Dizziness and giddiness: Secondary | ICD-10-CM | POA: Insufficient documentation

## 2012-12-28 DIAGNOSIS — G43909 Migraine, unspecified, not intractable, without status migrainosus: Secondary | ICD-10-CM | POA: Insufficient documentation

## 2012-12-28 DIAGNOSIS — Z9851 Tubal ligation status: Secondary | ICD-10-CM | POA: Insufficient documentation

## 2012-12-28 DIAGNOSIS — Z79899 Other long term (current) drug therapy: Secondary | ICD-10-CM | POA: Insufficient documentation

## 2012-12-28 DIAGNOSIS — H60399 Other infective otitis externa, unspecified ear: Secondary | ICD-10-CM | POA: Insufficient documentation

## 2012-12-28 DIAGNOSIS — H6091 Unspecified otitis externa, right ear: Secondary | ICD-10-CM

## 2012-12-28 HISTORY — DX: Gastro-esophageal reflux disease without esophagitis: K21.9

## 2012-12-28 MED ORDER — ANTIPYRINE-BENZOCAINE 5.4-1.4 % OT SOLN
3.0000 [drp] | Freq: Once | OTIC | Status: AC
  Administered 2012-12-28: 4 [drp] via OTIC
  Filled 2012-12-28: qty 10

## 2012-12-28 MED ORDER — FAMOTIDINE 20 MG PO TABS: 20.0000 mg | ORAL_TABLET | Freq: Two times a day (BID) | ORAL | Status: DC

## 2012-12-28 MED ORDER — CIPROFLOXACIN-DEXAMETHASONE 0.3-0.1 % OT SUSP
4.0000 [drp] | Freq: Once | OTIC | Status: AC
  Administered 2012-12-28: 4 [drp] via OTIC
  Filled 2012-12-28: qty 7.5

## 2012-12-28 NOTE — ED Notes (Addendum)
Patient stated that a bug flew in her ear. Pain is 5-6/10. Also complaining of acid reflux. Has been vomiting for the past 7 days. Patient states that she vomits at night right before she goes to bed. Sleeps with the head of  bed elevated.

## 2012-12-28 NOTE — ED Notes (Signed)
[id number]<AV> Expected date:<BR> Expected time:<BR> Means of arrival:<BR> Comments:<BR> 52 y/o cough

## 2012-12-28 NOTE — ED Provider Notes (Signed)
Medical screening examination/treatment/procedure(s) were performed by non-physician practitioner and as supervising physician I was immediately available for consultation/collaboration.  Orpah Greek, MD 12/28/12 [phone]

## 2012-12-28 NOTE — ED Notes (Signed)
Patient reports that she has been vomiting x 1 week. Patient states she felt a bug fly into her right ear and now hears "hummjing" and then dizziness occurred.

## 2012-12-28 NOTE — ED Provider Notes (Signed)
History     CSN: [id number]  Arrival date & time 12/28/12  0803   First MD Initiated Contact with Patient 12/28/12 0820      Chief Complaint  Patient presents with  . Emesis  . Dizziness  . Otalgia    (Consider location/radiation/quality/duration/timing/severity/associated sxs/prior treatment) HPI Comments: 52 y/o female with a PMHx of GERD presents to the ED complaining of worsening reflux x 2 weeks. States she has not been able to afford her reflux medication (unsure of name, begins with "v") due to not having insurance until July. Over the past week admits to mid abdominal cramping with associated nausea and vomiting at night only when she lays down to sleep, especially if she eats close to bed time. Symptoms only began once she ran out of her medication. Denies fever, chills, appetite changes. Generally tries to stay away from foods that aggravate her GERD. Also complaining of R ear pain beginning around 4:00 this morning while at work. States she walked outside and felt a sharp pain in her right ear followed by a humming noise causing her to feel dizzy for a few seconds. Currently no dizziness. Unsure if something flew into her ear.  Patient is a 52 y.o. female presenting with vomiting and ear pain. The history is provided by the patient.  Emesis Associated symptoms: abdominal pain   Associated symptoms: no chills and no diarrhea   Otalgia Associated symptoms: abdominal pain and vomiting   Associated symptoms: no diarrhea and no fever     Past Medical History  Diagnosis Date  . Arthritis   . Migraine   . GERD (gastroesophageal reflux disease)     Past Surgical History  Procedure Laterality Date  . Tubal ligation      Family History  Problem Relation Age of Onset  . Hypertension Mother   . Diabetes Mellitus II Mother   . Hypertension Father   . Diabetes Mellitus II Father     History  Substance Use Topics  . Smoking status: Never Smoker   . Smokeless tobacco:  Never Used  . Alcohol Use: No    OB History   Grav Para Term Preterm Abortions TAB SAB Ect Mult Living                  Review of Systems  Constitutional: Negative for fever, chills and appetite change.  HENT: Positive for ear pain.   Respiratory: Negative for shortness of breath.   Cardiovascular: Negative for chest pain.  Gastrointestinal: Positive for nausea, vomiting and abdominal pain. Negative for diarrhea and constipation.  Neurological: Positive for dizziness.  All other systems reviewed and are negative.    Allergies  Review of patient's allergies indicates no known allergies.  Home Medications   Current Outpatient Rx  Name  Route  Sig  Dispense  Refill  . aspirin 81 MG EC tablet   Oral   Take 81 mg by mouth every morning. Swallow whole.         . Melatonin 5 MG TABS   Oral   Take 1 tablet by mouth at bedtime as needed.         Marland Kitchen oxyCODONE-acetaminophen (PERCOCET) 10-650 MG per tablet   Oral   Take 1.5 tablets by mouth at bedtime as needed for pain (and at night for leg pain).          Marland Kitchen rOPINIRole (REQUIP) 0.5 MG tablet   Oral   Take 0.5-1.5 mg by mouth at bedtime as needed (  restless legs).         . topiramate (TOPAMAX) 200 MG tablet   Oral   Take 200 mg by mouth at bedtime.            BP 136/67  Pulse 65  Temp(Src) 98.3 F (36.8 C) (Oral)  Resp 16  Ht 5\' 4"  (1.626 m)  Wt 167 lb (75.751 kg)  BMI 28.65 kg/m2  SpO2 100%  LMP 06/24/2012  Physical Exam  Nursing note and vitals reviewed. Constitutional: She is oriented to person, place, and time. She appears well-developed and well-nourished. No distress.  HENT:  Head: Normocephalic and atraumatic.  Right Ear: Hearing and tympanic membrane normal. There is swelling and tenderness. No drainage.  Left Ear: Hearing, tympanic membrane, external ear and ear canal normal.  Mouth/Throat: Oropharynx is clear and moist.  Right ear canal inflamed, tender. No drainage.  Eyes: Conjunctivae are  normal.  Neck: Normal range of motion. Neck supple.  Cardiovascular: Normal rate, regular rhythm and normal heart sounds.   Pulmonary/Chest: Effort normal and breath sounds normal. No respiratory distress.  Abdominal: Soft. Normal appearance and bowel sounds are normal. She exhibits no distension and no mass. There is tenderness (mild) in the epigastric area. There is no rigidity, no rebound and no guarding.  Musculoskeletal: Normal range of motion. She exhibits no edema.  Neurological: She is alert and oriented to person, place, and time.  Skin: Skin is warm and dry. She is not diaphoretic.  Psychiatric: She has a normal mood and affect. Her behavior is normal.    ED Course  Procedures (including critical care time)  Labs Reviewed - No data to display No results found.   Date: 12/28/2012  Rate: 66  Rhythm: normal sinus rhythm  QRS Axis: normal  Intervals: normal  ST/T Wave abnormalities: normal  Conduction Disutrbances:none  Narrative Interpretation: normal EKG  Old EKG Reviewed: unchanged   1. GERD (gastroesophageal reflux disease)   2. Otitis externa of right ear       MDM  52 y/o female with GERD, otitis externa. NAD, normal vitals. Rx pepcid as it is on the $3.99 list at [facility], which she states she can get until she has insurance in July to f/u with GI. Rx ciprodex and auralgan for otitis externa. EKG obtained in triage prior to being seen- unremarkable. Return precautions discussed. Patient states understanding of plan and is agreeable.        Trevor Mace, PA-C 12/28/12 [phone]

## 2012-12-29 ENCOUNTER — Emergency Department (HOSPITAL_COMMUNITY)
Admission: EM | Admit: 2012-12-29 | Discharge: 2012-12-29 | Disposition: A | Discharge status: Home / Self Care | Payer: Self-pay | Attending: Emergency Medicine | Admitting: Emergency Medicine

## 2012-12-29 ENCOUNTER — Encounter (HOSPITAL_COMMUNITY): Payer: Self-pay | Admitting: *Deleted

## 2012-12-29 DIAGNOSIS — K219 Gastro-esophageal reflux disease without esophagitis: Secondary | ICD-10-CM | POA: Insufficient documentation

## 2012-12-29 DIAGNOSIS — Z79899 Other long term (current) drug therapy: Secondary | ICD-10-CM | POA: Insufficient documentation

## 2012-12-29 DIAGNOSIS — H9209 Otalgia, unspecified ear: Secondary | ICD-10-CM | POA: Insufficient documentation

## 2012-12-29 DIAGNOSIS — Z7982 Long term (current) use of aspirin: Secondary | ICD-10-CM | POA: Insufficient documentation

## 2012-12-29 DIAGNOSIS — H9201 Otalgia, right ear: Secondary | ICD-10-CM

## 2012-12-29 DIAGNOSIS — G43909 Migraine, unspecified, not intractable, without status migrainosus: Secondary | ICD-10-CM | POA: Insufficient documentation

## 2012-12-29 DIAGNOSIS — M1990 Unspecified osteoarthritis, unspecified site: Secondary | ICD-10-CM | POA: Insufficient documentation

## 2012-12-29 DIAGNOSIS — H9191 Unspecified hearing loss, right ear: Secondary | ICD-10-CM

## 2012-12-29 MED ORDER — HYDROCODONE-ACETAMINOPHEN 5-325 MG PO TABS: 1.0000 | ORAL_TABLET | ORAL | Status: DC | PRN

## 2012-12-29 NOTE — ED Notes (Signed)
Pt states was here yesterday for R ear pain, states was told she had a spider bite inside her ear, given drops, pt states drops are not going down in ear and ear pain is getting worse, pt states when she sits up she feels dizzy and has nausea, pt states she hears a "buzzing" noise in ear and feels like something is in ear.

## 2012-12-29 NOTE — ED Provider Notes (Signed)
History     CSN: [id number]  Arrival date & time 12/29/12  1956   First MD Initiated Contact with Patient 12/29/12 2013      Chief Complaint  Patient presents with  . Otalgia   HPI  History provided by the patient. Patient is a 52 year old female who returns to emergency room for continued evaluation of right ear discomfort and hearing loss. Patient was seen yesterday for similar symptoms. She reports that she was outside and felt like something flew into her ear. She did attempt several methods to try to clean the ear including using a Q-tip. She was seen for this in the emergency room was told that it didn't appear to be any significant infection or other cause of her symptoms and hearing loss. She was given antibiotic eardrops and pain drops to put in her ear. She has been using these but states that there have been no improvements. She also continues to feel a buzzing or as if something is in the ear with discomfort. Denies any bleeding or drainage. Denies any significant congestion or sinus issues. No other aggravating or relieving factors. No other associated symptoms.    Past Medical History  Diagnosis Date  . Arthritis   . Migraine   . GERD (gastroesophageal reflux disease)     Past Surgical History  Procedure Laterality Date  . Tubal ligation      Family History  Problem Relation Age of Onset  . Hypertension Mother   . Diabetes Mellitus II Mother   . Hypertension Father   . Diabetes Mellitus II Father     History  Substance Use Topics  . Smoking status: Never Smoker   . Smokeless tobacco: Never Used  . Alcohol Use: No    OB History   Grav Para Term Preterm Abortions TAB SAB Ect Mult Living                  Review of Systems  Constitutional: Negative for fever, chills and diaphoresis.  HENT: Positive for hearing loss, ear pain and tinnitus. Negative for congestion, rhinorrhea and ear discharge.   Respiratory: Negative for cough.   All other systems  reviewed and are negative.    Allergies  Review of patient's allergies indicates no known allergies.  Home Medications   Current Outpatient Rx  Name  Route  Sig  Dispense  Refill  . aspirin 81 MG EC tablet   Oral   Take 81 mg by mouth every morning. Swallow whole.         . famotidine (PEPCID) 20 MG tablet   Oral   Take 1 tablet (20 mg total) by mouth 2 (two) times daily.   60 tablet   0   . Melatonin 5 MG TABS   Oral   Take 1 tablet by mouth at bedtime as needed.         Marland Kitchen oxyCODONE-acetaminophen (PERCOCET) 10-650 MG per tablet   Oral   Take 1.5 tablets by mouth at bedtime as needed for pain (and at night for leg pain).          Marland Kitchen rOPINIRole (REQUIP) 0.5 MG tablet   Oral   Take 0.5-1.5 mg by mouth at bedtime as needed (restless legs).         . topiramate (TOPAMAX) 200 MG tablet   Oral   Take 200 mg by mouth at bedtime. For migraine           BP 121/78  Pulse 71  Temp(Src) 98.8 F (37.1 C) (Oral)  Resp 18  SpO2 100%  LMP 06/24/2012  Physical Exam  Nursing note and vitals reviewed. Constitutional: She is oriented to person, place, and time. She appears well-developed and well-nourished. No distress.  HENT:  Head: Normocephalic.  Ears:  There is a small area of erythema to the upper posterior aspect of the right TM. There appears to be distortion of the Umbo.  Weber test shows lateralization to the left side.  Pt has a poor rinne test to right side but normal on left.  Neck: Normal range of motion. Neck supple.  Cardiovascular: Normal rate and regular rhythm.   Pulmonary/Chest: Effort normal and breath sounds normal. No stridor. No respiratory distress.  Abdominal: Soft.  Musculoskeletal: Normal range of motion.  Neurological: She is alert and oriented to person, place, and time.  Skin: Skin is warm and dry. No rash noted.  Psychiatric: She has a normal mood and affect. Her behavior is normal.    ED Course  Procedures      1. Otalgia of  right ear   2. Hearing decreased, right       MDM  8:30PM patient seen and evaluated. Patient appears well in no acute distress. Patient is holding her right ear. With signs of mild discomforts.  There seems to have possibly some traumatic injury to the upper portion of the TM. No signs of perforation however. At this time will provide referral for an ENT specialist for additional evaluation.      Angus Seller, PA-C 12/29/12 2359

## 2012-12-31 NOTE — ED Provider Notes (Signed)
Medical screening examination/treatment/procedure(s) were performed by non-physician practitioner and as supervising physician I was immediately available for consultation/collaboration.    B. Karle Starch, MD 12/31/12 Dyann Kief

## 2013-01-17 ENCOUNTER — Emergency Department (HOSPITAL_COMMUNITY): Payer: Self-pay

## 2013-01-17 ENCOUNTER — Encounter (HOSPITAL_COMMUNITY): Payer: Self-pay | Admitting: *Deleted

## 2013-01-17 ENCOUNTER — Emergency Department (HOSPITAL_COMMUNITY)
Admission: EM | Admit: 2013-01-17 | Discharge: 2013-01-18 | Disposition: A | Discharge status: Home / Self Care | Payer: Self-pay | Attending: Emergency Medicine | Admitting: Emergency Medicine

## 2013-01-17 DIAGNOSIS — E876 Hypokalemia: Secondary | ICD-10-CM | POA: Insufficient documentation

## 2013-01-17 DIAGNOSIS — G2581 Restless legs syndrome: Secondary | ICD-10-CM | POA: Insufficient documentation

## 2013-01-17 DIAGNOSIS — K219 Gastro-esophageal reflux disease without esophagitis: Secondary | ICD-10-CM | POA: Insufficient documentation

## 2013-01-17 DIAGNOSIS — M7989 Other specified soft tissue disorders: Secondary | ICD-10-CM | POA: Insufficient documentation

## 2013-01-17 DIAGNOSIS — G43909 Migraine, unspecified, not intractable, without status migrainosus: Secondary | ICD-10-CM | POA: Insufficient documentation

## 2013-01-17 DIAGNOSIS — Z8739 Personal history of other diseases of the musculoskeletal system and connective tissue: Secondary | ICD-10-CM | POA: Insufficient documentation

## 2013-01-17 DIAGNOSIS — R600 Localized edema: Secondary | ICD-10-CM

## 2013-01-17 DIAGNOSIS — Z79899 Other long term (current) drug therapy: Secondary | ICD-10-CM | POA: Insufficient documentation

## 2013-01-17 HISTORY — DX: Other specified soft tissue disorders: M79.89

## 2013-01-17 HISTORY — DX: Restless legs syndrome: G25.81

## 2013-01-17 MED ORDER — FUROSEMIDE 10 MG/ML IJ SOLN
40.0000 mg | Freq: Once | INTRAMUSCULAR | Status: DC
  Filled 2013-01-17: qty 8
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
## 2013-01-17 NOTE — ED Notes (Signed)
Pt states that she began have leg swelling on Tuesday; Pt states that she notified her PCP and she advised to take an extra fluid pill during the day to help; pt states that she went to the beach over the weekend and that feet began to swell again today; pt reports that blisters appeared on right leg this afternoon; + pedal pulses; pt states that the swelling is from the knees down

## 2013-01-18 LAB — CBC WITH DIFFERENTIAL/PLATELET
Basophils Relative: 1 % (ref 0–1)
HCT: 34.5 % — ABNORMAL LOW (ref 36.0–46.0)
Hemoglobin: 11.4 g/dL — ABNORMAL LOW (ref 12.0–15.0)
Lymphocytes Relative: 35 % (ref 12–46)
Lymphs Abs: 2.3 10*3/uL (ref 0.7–4.0)
MCH: 29.5 pg (ref 26.0–34.0)
MCHC: 33 g/dL (ref 30.0–36.0)
Monocytes Absolute: 0.4 10*3/uL (ref 0.1–1.0)
Monocytes Relative: 7 % (ref 3–12)
Neutro Abs: 3.4 10*3/uL (ref 1.7–7.7)
Platelets: 212 10*3/uL (ref 150–400)
RBC: 3.86 MIL/uL — ABNORMAL LOW (ref 3.87–5.11)

## 2013-01-18 LAB — COMPREHENSIVE METABOLIC PANEL
AST: 15 U/L (ref 0–37)
Albumin: 3.3 g/dL — ABNORMAL LOW (ref 3.5–5.2)
Alkaline Phosphatase: 119 U/L — ABNORMAL HIGH (ref 39–117)
BUN: 16 mg/dL (ref 6–23)
CO2: 27 mEq/L (ref 19–32)
Chloride: 102 mEq/L (ref 96–112)
Creatinine, Ser: 0.98 mg/dL (ref 0.50–1.10)
GFR calc Af Amer: 76 mL/min — ABNORMAL LOW (ref >90)
GFR calc non Af Amer: 65 mL/min — ABNORMAL LOW (ref >90)
Glucose, Bld: 102 mg/dL — ABNORMAL HIGH (ref 70–99)
Total Bilirubin: 0.3 mg/dL (ref 0.3–1.2)

## 2013-01-18 LAB — URINALYSIS W MICROSCOPIC + REFLEX CULTURE
Glucose, UA: NEGATIVE mg/dL
Ketones, ur: NEGATIVE mg/dL
Nitrite: NEGATIVE
Protein, ur: NEGATIVE mg/dL
Specific Gravity, Urine: 1.019 (ref 1.005–1.030)
Urobilinogen, UA: 1 mg/dL (ref 0.0–1.0)

## 2013-01-18 MED ORDER — POTASSIUM CHLORIDE CRYS ER 20 MEQ PO TBCR: 20.0000 meq | EXTENDED_RELEASE_TABLET | Freq: Two times a day (BID) | ORAL | Status: DC

## 2013-01-18 MED ORDER — FUROSEMIDE 10 MG/ML IJ SOLN
60.0000 mg | Freq: Once | INTRAMUSCULAR | Status: AC
  Administered 2013-01-18: 60 mg via INTRAMUSCULAR

## 2013-01-18 MED ORDER — POTASSIUM CHLORIDE CRYS ER 20 MEQ PO TBCR
40.0000 meq | EXTENDED_RELEASE_TABLET | Freq: Once | ORAL | Status: AC
  Administered 2013-01-18: 40 meq via ORAL
  Filled 2013-01-18: qty 2

## 2013-01-18 NOTE — ED Provider Notes (Signed)
Medical screening examination/treatment/procedure(s) were performed by non-physician practitioner and as supervising physician I was immediately available for consultation/collaboration.  Delora Fuel, MD 82/99/37 [id number]

## 2013-08-09 ENCOUNTER — Other Ambulatory Visit (HOSPITAL_COMMUNITY): Payer: Self-pay | Admitting: Nurse Practitioner

## 2013-08-09 DIAGNOSIS — Z139 Encounter for screening, unspecified: Secondary | ICD-10-CM

## 2013-08-16 ENCOUNTER — Ambulatory Visit (HOSPITAL_COMMUNITY): Payer: 59

## 2013-09-23 ENCOUNTER — Other Ambulatory Visit (HOSPITAL_COMMUNITY): Payer: Self-pay | Admitting: *Deleted

## 2013-09-23 DIAGNOSIS — Z1231 Encounter for screening mammogram for malignant neoplasm of breast: Secondary | ICD-10-CM

## 2013-10-06 ENCOUNTER — Ambulatory Visit (HOSPITAL_COMMUNITY)
Admission: RE | Admit: 2013-10-06 | Discharge: 2013-10-06 | Disposition: A | Discharge status: Home / Self Care | Payer: Self-pay | Source: Ambulatory Visit | Attending: Nurse Practitioner | Admitting: Nurse Practitioner

## 2013-10-06 DIAGNOSIS — Z1231 Encounter for screening mammogram for malignant neoplasm of breast: Secondary | ICD-10-CM

## 2013-11-15 ENCOUNTER — Encounter (HOSPITAL_BASED_OUTPATIENT_CLINIC_OR_DEPARTMENT_OTHER): Payer: Self-pay | Admitting: Emergency Medicine

## 2013-11-15 ENCOUNTER — Emergency Department (HOSPITAL_BASED_OUTPATIENT_CLINIC_OR_DEPARTMENT_OTHER): Payer: 59

## 2013-11-15 ENCOUNTER — Emergency Department (HOSPITAL_BASED_OUTPATIENT_CLINIC_OR_DEPARTMENT_OTHER)
Admission: EM | Admit: 2013-11-15 | Discharge: 2013-11-15 | Disposition: A | Discharge status: Home / Self Care | Payer: 59 | Attending: Emergency Medicine | Admitting: Emergency Medicine

## 2013-11-15 DIAGNOSIS — Z791 Long term (current) use of non-steroidal anti-inflammatories (NSAID): Secondary | ICD-10-CM | POA: Insufficient documentation

## 2013-11-15 DIAGNOSIS — M129 Arthropathy, unspecified: Secondary | ICD-10-CM | POA: Insufficient documentation

## 2013-11-15 DIAGNOSIS — N201 Calculus of ureter: Secondary | ICD-10-CM | POA: Insufficient documentation

## 2013-11-15 DIAGNOSIS — N200 Calculus of kidney: Secondary | ICD-10-CM | POA: Diagnosis present

## 2013-11-15 DIAGNOSIS — G2581 Restless legs syndrome: Secondary | ICD-10-CM | POA: Insufficient documentation

## 2013-11-15 DIAGNOSIS — G43909 Migraine, unspecified, not intractable, without status migrainosus: Secondary | ICD-10-CM | POA: Insufficient documentation

## 2013-11-15 DIAGNOSIS — Z3202 Encounter for pregnancy test, result negative: Secondary | ICD-10-CM | POA: Insufficient documentation

## 2013-11-15 DIAGNOSIS — Z79899 Other long term (current) drug therapy: Secondary | ICD-10-CM | POA: Insufficient documentation

## 2013-11-15 DIAGNOSIS — K219 Gastro-esophageal reflux disease without esophagitis: Secondary | ICD-10-CM | POA: Insufficient documentation

## 2013-11-15 HISTORY — DX: Calculus of kidney: N20.0

## 2013-11-15 LAB — URINALYSIS, ROUTINE W REFLEX MICROSCOPIC
Bilirubin Urine: NEGATIVE
Glucose, UA: NEGATIVE mg/dL
KETONES UR: NEGATIVE mg/dL
NITRITE: NEGATIVE
PH: 7.5 (ref 5.0–8.0)
Protein, ur: NEGATIVE mg/dL
SPECIFIC GRAVITY, URINE: 1.012 (ref 1.005–1.030)
Urobilinogen, UA: 0.2 mg/dL (ref 0.0–1.0)

## 2013-11-15 LAB — CBC WITH DIFFERENTIAL/PLATELET
Basophils Absolute: 0 10*3/uL (ref 0.0–0.1)
Basophils Relative: 0 % (ref 0–1)
Eosinophils Absolute: 0.2 10*3/uL (ref 0.0–0.7)
Eosinophils Relative: 2 % (ref 0–5)
HEMATOCRIT: 37.9 % (ref 36.0–46.0)
Hemoglobin: 12.4 g/dL (ref 12.0–15.0)
Lymphocytes Relative: 10 % — ABNORMAL LOW (ref 12–46)
Lymphs Abs: 0.9 10*3/uL (ref 0.7–4.0)
MCH: 30.7 pg (ref 26.0–34.0)
MCHC: 32.7 g/dL (ref 30.0–36.0)
MCV: 93.8 fL (ref 78.0–100.0)
MONO ABS: 0.4 10*3/uL (ref 0.1–1.0)
Monocytes Relative: 5 % (ref 3–12)
Neutro Abs: 7.7 10*3/uL (ref 1.7–7.7)
Neutrophils Relative %: 84 % — ABNORMAL HIGH (ref 43–77)
Platelets: 181 10*3/uL (ref 150–400)
RBC: 4.04 MIL/uL (ref 3.87–5.11)
RDW: 14.7 % (ref 11.5–15.5)
WBC: 9.2 10*3/uL (ref 4.0–10.5)

## 2013-11-15 LAB — COMPREHENSIVE METABOLIC PANEL
ALK PHOS: 108 U/L (ref 39–117)
ALT: 10 U/L (ref 0–35)
AST: 15 U/L (ref 0–37)
Albumin: 4.2 g/dL (ref 3.5–5.2)
BUN: 18 mg/dL (ref 6–23)
CHLORIDE: 110 meq/L (ref 96–112)
CO2: 23 mEq/L (ref 19–32)
CREATININE: 1 mg/dL (ref 0.50–1.10)
Calcium: 9.7 mg/dL (ref 8.4–10.5)
GFR calc Af Amer: 73 mL/min — ABNORMAL LOW (ref >90)
GFR, EST NON AFRICAN AMERICAN: 63 mL/min — AB (ref >90)
Glucose, Bld: 108 mg/dL — ABNORMAL HIGH (ref 70–99)
Potassium: 4 mEq/L (ref 3.7–5.3)
SODIUM: 145 meq/L (ref 137–147)
Total Bilirubin: 0.2 mg/dL — ABNORMAL LOW (ref 0.3–1.2)
Total Protein: 7 g/dL (ref 6.0–8.3)

## 2013-11-15 LAB — LIPASE, BLOOD: Lipase: 24 U/L (ref 11–59)

## 2013-11-15 LAB — URINE MICROSCOPIC-ADD ON

## 2013-11-15 LAB — PREGNANCY, URINE: Preg Test, Ur: NEGATIVE

## 2013-11-15 MED ORDER — CEPHALEXIN 500 MG PO CAPS: 500.0000 mg | ORAL_CAPSULE | Freq: Four times a day (QID) | ORAL | Status: DC

## 2013-11-15 MED ORDER — OXYCODONE-ACETAMINOPHEN 5-325 MG PO TABS: 1.0000 | ORAL_TABLET | Freq: Four times a day (QID) | ORAL | Status: DC | PRN

## 2013-11-15 MED ORDER — ONDANSETRON 4 MG PO TBDP
4.0000 mg | ORAL_TABLET | Freq: Once | ORAL | Status: AC
  Administered 2013-11-15: 4 mg via ORAL

## 2013-11-15 MED ORDER — OXYCODONE-ACETAMINOPHEN 5-325 MG PO TABS
1.0000 | ORAL_TABLET | Freq: Once | ORAL | Status: AC
Start: 2013-11-15 — End: 2013-11-15
  Administered 2013-11-15: 1 via ORAL
  Filled 2013-11-15: qty 1

## 2013-11-15 MED ORDER — ONDANSETRON 4 MG PO TBDP
ORAL_TABLET | ORAL | Status: AC
  Filled 2013-11-15: qty 1

## 2013-11-15 MED ORDER — SODIUM CHLORIDE 0.9 % IV BOLUS (SEPSIS)
1000.0000 mL | INTRAVENOUS | Status: AC
  Administered 2013-11-15: 1000 mL via INTRAVENOUS

## 2013-11-15 NOTE — ED Provider Notes (Signed)
CSN: [id number]     Arrival date & time 11/15/13  4098 History   First MD Initiated Contact with Patient 11/15/13 1037     Chief Complaint  Patient presents with  . Hematuria     (Consider location/radiation/quality/duration/timing/severity/associated sxs/prior Treatment) Patient is a 53 y.o. female presenting with abdominal pain. The history is provided by the patient.  Abdominal Pain Pain location:  R flank Pain quality: aching   Pain radiates to:  Does not radiate Pain severity:  Mild Onset quality:  Sudden Timing:  Intermittent Progression:  Unchanged Chronicity:  New Context comment:  At rest Relieved by:  Nothing Worsened by:  Nothing tried Ineffective treatments:  None tried Associated symptoms: hematuria   Associated symptoms: no chest pain, no cough, no diarrhea, no dysuria, no fatigue, no fever, no nausea, no shortness of breath and no vomiting     Past Medical History  Diagnosis Date  . Arthritis   . Migraine   . GERD (gastroesophageal reflux disease)   . Restless leg syndrome   . Swelling of lower limb     bialteral lower leg swelling  . Kidney stones    Past Surgical History  Procedure Laterality Date  . Tubal ligation     Family History  Problem Relation Age of Onset  . Hypertension Mother   . Diabetes Mellitus II Mother   . Hypertension Father   . Diabetes Mellitus II Father    History  Substance Use Topics  . Smoking status: Never Smoker   . Smokeless tobacco: Never Used  . Alcohol Use: Yes     Comment: occ   OB History   Grav Para Term Preterm Abortions TAB SAB Ect Mult Living                 Review of Systems  Constitutional: Negative for fever and fatigue.  HENT: Negative for congestion and drooling.   Eyes: Negative for pain.  Respiratory: Negative for cough and shortness of breath.   Cardiovascular: Negative for chest pain.  Gastrointestinal: Negative for nausea, vomiting, abdominal pain and diarrhea.  Genitourinary: Positive  for hematuria and flank pain. Negative for dysuria.  Musculoskeletal: Negative for back pain, gait problem and neck pain.  Skin: Negative for color change.  Neurological: Negative for dizziness and headaches.  Hematological: Negative for adenopathy.  Psychiatric/Behavioral: Negative for behavioral problems.  All other systems reviewed and are negative.     Allergies  Review of patient's allergies indicates no known allergies.  Home Medications   Prior to Admission medications   Medication Sig Start Date End Date Taking? Authorizing Provider  esomeprazole (NEXIUM) 20 MG capsule Take 40 mg by mouth daily before breakfast.   Yes Historical Provider, MD  furosemide (LASIX) 40 MG tablet Take 40 mg by mouth 2 (two) times daily.   Yes Historical Provider, MD  HYDROcodone-acetaminophen (NORCO) 5-325 MG per tablet Take 1 tablet by mouth every 4 (four) hours as needed for pain. 12/29/12  Yes Peter S Dammen, PA-C  naproxen (NAPROSYN) 500 MG tablet Take 500 mg by mouth 2 (two) times daily with a meal.   Yes Historical Provider, MD  potassium chloride SA (K-DUR,KLOR-CON) 20 MEQ tablet Take 1 tablet (20 mEq total) by mouth 2 (two) times daily. 01/18/13  Yes Tatyana A Kirichenko, PA-C  topiramate (TOPAMAX) 200 MG tablet Take 200 mg by mouth at bedtime. For migraine   Yes Historical Provider, MD  Melatonin 5 MG TABS Take 1 tablet by mouth at bedtime as  needed (sleep).     Historical Provider, MD  oxyCODONE-acetaminophen (PERCOCET) 10-325 MG per tablet Take 1 tablet by mouth at bedtime as needed for pain.     Historical Provider, MD  rOPINIRole (REQUIP) 0.5 MG tablet Take 1 mg by mouth at bedtime as needed (restless legs).     Historical Provider, MD   BP 140/93  Pulse 64  Temp(Src) 98.4 F (36.9 C) (Oral)  Resp 16  Ht 5\' 5"  (1.651 m)  Wt 168 lb (76.204 kg)  BMI 27.96 kg/m2  SpO2 100% Physical Exam  Nursing note and vitals reviewed. Constitutional: She is oriented to person, place, and time. She  appears well-developed and well-nourished.  HENT:  Head: Normocephalic.  Mouth/Throat: Oropharynx is clear and moist. No oropharyngeal exudate.  Eyes: Conjunctivae and EOM are normal. Pupils are equal, round, and reactive to light.  Neck: Normal range of motion. Neck supple.  Cardiovascular: Normal rate, regular rhythm, normal heart sounds and intact distal pulses.  Exam reveals no gallop and no friction rub.   No murmur heard. Pulmonary/Chest: Effort normal and breath sounds normal. No respiratory distress. She has no wheezes.  Abdominal: Soft. Bowel sounds are normal. There is no tenderness. There is no rebound and no guarding.  Musculoskeletal: Normal range of motion. She exhibits no edema and no tenderness.  No CVA tenderness bilaterally.  Neurological: She is alert and oriented to person, place, and time.  Skin: Skin is warm and dry.  Psychiatric: She has a normal mood and affect. Her behavior is normal.    ED Course  Procedures (including critical care time) Labs Review Labs Reviewed  URINALYSIS, ROUTINE W REFLEX MICROSCOPIC - Abnormal; Notable for the following:    APPearance CLOUDY (*)    Hgb urine dipstick LARGE (*)    Leukocytes, UA TRACE (*)    All other components within normal limits  URINE MICROSCOPIC-ADD ON - Abnormal; Notable for the following:    Squamous Epithelial / LPF FEW (*)    Bacteria, UA MANY (*)    All other components within normal limits  CBC WITH DIFFERENTIAL - Abnormal; Notable for the following:    Neutrophils Relative % 84 (*)    Lymphocytes Relative 10 (*)    All other components within normal limits  COMPREHENSIVE METABOLIC PANEL - Abnormal; Notable for the following:    Glucose, Bld 108 (*)    Total Bilirubin 0.2 (*)    GFR calc non Af Amer 63 (*)    GFR calc Af Amer 73 (*)    All other components within normal limits  URINE CULTURE  LIPASE, BLOOD  PREGNANCY, URINE    Imaging Review Ct Abdomen Pelvis Wo Contrast  11/15/2013   CLINICAL  DATA:  Hematuria, right flank pain.  EXAM: CT ABDOMEN AND PELVIS WITHOUT CONTRAST  TECHNIQUE: Multidetector CT imaging of the abdomen and pelvis was performed following the standard protocol without intravenous contrast.  COMPARISON:  None.  FINDINGS: Large hiatal hernia. Heart is normal size. Lung bases are clear. No effusions.  Liver, gallbladder, spleen, pancreas, adrenals are unremarkable.  5 mm stone in the proximal right ureter with mild right hydronephrosis. Multiple bilateral small nonobstructing renal stones. No hydronephrosis or ureteral stone on the left. Urinary bladder is unremarkable. Calcified phleboliths in the anatomic pelvis. Uterus and adnexa grossly unremarkable.  Normal appendix. Stomach, large and small bowel unremarkable. No free fluid, free air or adenopathy. Aorta is normal caliber.  No acute bony abnormality or focal bone lesion.  IMPRESSION:  5 mm proximal right ureteral stone with mild right hydronephrosis.  Bilateral nephrolithiasis.  Large hiatal hernia.   Electronically Signed   By: Charlett NoseKevin  Dover M.D.   On: 11/15/2013 11:50     EKG Interpretation None      MDM   Final diagnoses:  Nephrolithiasis    11:05 AM 53 y.o. female who presents with hematuria which began yesterday morning. She notes it was initially dark red but has lightened since then and now appears yellow. Her primary care doctor has arranged a urologist appointment tomorrow. She notes that she began having lower abdominal cramping pain and right flank pain this morning. She does have a remote history of kidney stones. She denies any other associated symptoms. She is afebrile and vital signs are unremarkable here. Will get labs and CT.   12:25 PM: 5mm proximal right ureteral stone w/ mild hydro noted. Pt comfortable after 1 percocet po. Wbc not elev, pt well appearing, denies dysuria. UA w/ many bact but few leuks and some squamos epith cells. Doubt UTI. Will however cover w/ keflex until pt f/u w/ urology  tomorrow. I printed off her labs/imaging so that she could take it to the appt and the urologist can decide whether to continue abx.  I have discussed the diagnosis/risks/treatment options with the patient and believe the pt to be eligible for discharge home to follow-up with [facility] tomorrow as scheduled. We also discussed returning to the ED immediately if new or worsening sx occur. We discussed the sx which are most concerning (e.g., worsening pain, fever) that necessitate immediate return. Medications administered to the patient during their visit and any new prescriptions provided to the patient are listed below.  Medications given during this visit Medications  sodium chloride 0.9 % bolus 1,000 mL (0 mLs Intravenous Stopped 11/15/13 1220)  oxyCODONE-acetaminophen (PERCOCET/ROXICET) 5-325 MG per tablet 1 tablet (1 tablet Oral Given 11/15/13 1122)  ondansetron (ZOFRAN-ODT) disintegrating tablet 4 mg (4 mg Oral Given 11/15/13 1121)    Discharge Medication List as of 11/15/2013 12:27 PM    START taking these medications   Details  cephALEXin (KEFLEX) 500 MG capsule Take 1 capsule (500 mg total) by mouth 4 (four) times daily., Starting 11/15/2013, Until Discontinued, Print    oxyCODONE-acetaminophen (PERCOCET) 5-325 MG per tablet Take 1-2 tablets by mouth every 6 (six) hours as needed for moderate pain., Starting 11/15/2013, Until Discontinued, Print         Junius ArgyleForrest S , MD 11/15/13 2051

## 2013-11-15 NOTE — ED Notes (Signed)
Patient states she got up to urinate on 11/14/13 at 0300 and urinated blood.  States she continued to urinate blood all day.  Was seen by her PCP and referred to see a Financial plannerUrologist tomorrow.  States she developed lower abdominal pain with radiation up to the RUQ this morning at 0200 which is associated with nausea. Hx of kidney stones.

## 2013-11-15 NOTE — Discharge Instructions (Signed)
Kidney Stones Kidney stones (urolithiasis) are solid masses that form inside your kidneys. The intense pain is caused by the stone moving through the kidney, ureter, bladder, and urethra (urinary tract). When the stone moves, the ureter starts to spasm around the stone. The stone is usually passed in your pee (urine).  HOME CARE  Drink enough fluids to keep your pee clear or pale yellow. This helps to get the stone out.  Strain all pee through the provided strainer. Do not pee without peeing through the strainer, not even once. If you pee the stone out, catch it in the strainer. The stone may be as small as a grain of salt. Take this to your doctor. This will help your doctor figure out what you can do to try to prevent more kidney stones.  Only take medicine as told by your doctor.  Follow up with your doctor as told.  Get follow-up X-rays as told by your doctor. GET HELP IF: You have pain that gets worse even if you have been taking pain medicine. GET HELP RIGHT AWAY IF:   Your pain does not get better with medicine.  You have a fever or shaking chills.  Your pain increases and gets worse over 18 hours.  You have new belly (abdominal) pain.  You feel faint or pass out.  You are unable to pee. MAKE SURE YOU:   Understand these instructions.  Will watch your condition.  Will get help right away if you are not doing well or get worse. Document Released: 01/07/2008 Document Revised: 03/23/2013 Document Reviewed: 12/22/2012 [employer] Patient Information 2014 [employer].  

## 2013-11-16 LAB — URINE CULTURE
CULTURE: NO GROWTH
Colony Count: NO GROWTH

## 2015-08-31 ENCOUNTER — Encounter (HOSPITAL_COMMUNITY): Payer: Self-pay | Admitting: *Deleted

## 2015-08-31 ENCOUNTER — Emergency Department (HOSPITAL_COMMUNITY)
Admission: EM | Admit: 2015-08-31 | Discharge: 2015-08-31 | Disposition: A | Discharge status: Home / Self Care | Payer: No Typology Code available for payment source | Attending: Emergency Medicine | Admitting: Emergency Medicine

## 2015-08-31 ENCOUNTER — Emergency Department (HOSPITAL_COMMUNITY): Payer: No Typology Code available for payment source

## 2015-08-31 DIAGNOSIS — Z87442 Personal history of urinary calculi: Secondary | ICD-10-CM | POA: Diagnosis not present

## 2015-08-31 DIAGNOSIS — Z8669 Personal history of other diseases of the nervous system and sense organs: Secondary | ICD-10-CM | POA: Diagnosis not present

## 2015-08-31 DIAGNOSIS — Z792 Long term (current) use of antibiotics: Secondary | ICD-10-CM | POA: Diagnosis not present

## 2015-08-31 DIAGNOSIS — S161XXA Strain of muscle, fascia and tendon at neck level, initial encounter: Secondary | ICD-10-CM | POA: Diagnosis not present

## 2015-08-31 DIAGNOSIS — K219 Gastro-esophageal reflux disease without esophagitis: Secondary | ICD-10-CM | POA: Diagnosis not present

## 2015-08-31 DIAGNOSIS — Y9389 Activity, other specified: Secondary | ICD-10-CM | POA: Insufficient documentation

## 2015-08-31 DIAGNOSIS — Z79899 Other long term (current) drug therapy: Secondary | ICD-10-CM | POA: Insufficient documentation

## 2015-08-31 DIAGNOSIS — Z8679 Personal history of other diseases of the circulatory system: Secondary | ICD-10-CM | POA: Diagnosis not present

## 2015-08-31 DIAGNOSIS — M1990 Unspecified osteoarthritis, unspecified site: Secondary | ICD-10-CM | POA: Diagnosis not present

## 2015-08-31 DIAGNOSIS — S8001XA Contusion of right knee, initial encounter: Secondary | ICD-10-CM | POA: Insufficient documentation

## 2015-08-31 DIAGNOSIS — Z23 Encounter for immunization: Secondary | ICD-10-CM | POA: Insufficient documentation

## 2015-08-31 DIAGNOSIS — Y92410 Unspecified street and highway as the place of occurrence of the external cause: Secondary | ICD-10-CM | POA: Insufficient documentation

## 2015-08-31 DIAGNOSIS — Y998 Other external cause status: Secondary | ICD-10-CM | POA: Diagnosis not present

## 2015-08-31 DIAGNOSIS — S5012XA Contusion of left forearm, initial encounter: Secondary | ICD-10-CM | POA: Diagnosis not present

## 2015-08-31 DIAGNOSIS — S8002XA Contusion of left knee, initial encounter: Secondary | ICD-10-CM | POA: Diagnosis not present

## 2015-08-31 DIAGNOSIS — S199XXA Unspecified injury of neck, initial encounter: Secondary | ICD-10-CM | POA: Diagnosis present

## 2015-08-31 DIAGNOSIS — S50812A Abrasion of left forearm, initial encounter: Secondary | ICD-10-CM | POA: Diagnosis not present

## 2015-08-31 MED ORDER — TETANUS-DIPHTH-ACELL PERTUSSIS 5-2.5-18.5 LF-MCG/0.5 IM SUSP
0.5000 mL | Freq: Once | INTRAMUSCULAR | Status: AC
  Administered 2015-08-31: 0.5 mL via INTRAMUSCULAR
  Filled 2015-08-31: qty 0.5
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
## 2015-08-31 NOTE — ED Notes (Signed)
Pt departed in NAD.  
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
## 2015-08-31 NOTE — ED Notes (Signed)
Patient transported to X-ray 

## 2015-08-31 NOTE — Discharge Instructions (Signed)
Abrasion An abrasion is a cut or scrape on the outer surface of your skin. An abrasion does not extend through all of the layers of your skin. It is important to care for your abrasion properly to prevent infection. CAUSES Most abrasions are caused by falling on or gliding across the ground or another surface. When your skin rubs on something, the outer and inner layer of skin rubs off.  SYMPTOMS A cut or scrape is the main symptom of this condition. The scrape may be bleeding, or it may appear red or pink. If there was an associated fall, there may be an underlying bruise. DIAGNOSIS An abrasion is diagnosed with a physical exam. TREATMENT Treatment for this condition depends on how large and deep the abrasion is. Usually, your abrasion will be cleaned with water and mild soap. This removes any dirt or debris that may be stuck. An antibiotic ointment may be applied to the abrasion to help prevent infection. A bandage (dressing) may be placed on the abrasion to keep it clean. You may also need a tetanus shot. HOME CARE INSTRUCTIONS Medicines  Take or apply medicines only as directed by your health care provider.  If you were prescribed an antibiotic ointment, finish all of it even if you start to feel better. Wound Care  Clean the wound with mild soap and water 2-3 times per day or as directed by your health care provider. Pat your wound dry with a clean towel. Do not rub it.  There are many different ways to close and cover a wound. Follow instructions from your health care provider about:  Wound care.  Dressing changes and removal.  Check your wound every day for signs of infection. Watch for:  Redness, swelling, or pain.  Fluid, blood, or pus. General Instructions  Keep the dressing dry as directed by your health care provider. Do not take baths, swim, use a hot tub, or do anything that would put your wound underwater until your health care provider approves.  If there is  swelling, raise (elevate) the injured area above the level of your heart while you are sitting or lying down.  Keep all follow-up visits as directed by your health care provider. This is important. SEEK MEDICAL CARE IF:  You received a tetanus shot and you have swelling, severe pain, redness, or bleeding at the injection site.  Your pain is not controlled with medicine.  You have increased redness, swelling, or pain at the site of your wound. SEEK IMMEDIATE MEDICAL CARE IF:  You have a red streak going away from your wound.  You have a fever.  You have fluid, blood, or pus coming from your wound.  You notice a bad smell coming from your wound or your dressing.   This information is not intended to replace advice given to you by your health care provider. Make sure you discuss any questions you have with your health care provider.   Document Released: 04/30/2005 Document Revised: 04/11/2015 Document Reviewed: 07/19/2014 Elsevier Interactive Patient Education 2016 [employer]  Cervical Sprain A cervical sprain is an injury in the neck in which the strong, fibrous tissues (ligaments) that connect your neck bones stretch or tear. Cervical sprains can range from mild to severe. Severe cervical sprains can cause the neck vertebrae to be unstable. This can lead to damage of the spinal cord and can result in serious nervous system problems. The amount of time it takes for a cervical sprain to get better depends on  the cause and extent of the injury. Most cervical sprains heal in 1 to 3 weeks. CAUSES  Severe cervical sprains may be caused by:   Contact sport injuries (such as from football, rugby, wrestling, hockey, auto racing, gymnastics, diving, martial arts, or boxing).   Motor vehicle collisions.   Whiplash injuries. This is an injury from a sudden forward and backward whipping movement of the head and neck.  Falls.  Mild cervical sprains may be caused by:   Being in an  awkward position, such as while cradling a telephone between your ear and shoulder.   Sitting in a chair that does not offer proper support.   Working at a poorly Marketing executive station.   Looking up or down for long periods of time.  SYMPTOMS   Pain, soreness, stiffness, or a burning sensation in the front, back, or sides of the neck. This discomfort may develop immediately after the injury or slowly, 24 hours or more after the injury.   Pain or tenderness directly in the middle of the back of the neck.   Shoulder or upper back pain.   Limited ability to move the neck.   Headache.   Dizziness.   Weakness, numbness, or tingling in the hands or arms.   Muscle spasms.   Difficulty swallowing or chewing.   Tenderness and swelling of the neck.  DIAGNOSIS  Most of the time your health care provider can diagnose a cervical sprain by taking your history and doing a physical exam. Your health care provider will ask about previous neck injuries and any known neck problems, such as arthritis in the neck. X-rays may be taken to find out if there are any other problems, such as with the bones of the neck. Other tests, such as a CT scan or MRI, may also be needed.  TREATMENT  Treatment depends on the severity of the cervical sprain. Mild sprains can be treated with rest, keeping the neck in place (immobilization), and pain medicines. Severe cervical sprains are immediately immobilized. Further treatment is done to help with pain, muscle spasms, and other symptoms and may include:  Medicines, such as pain relievers, numbing medicines, or muscle relaxants.   Physical therapy. This may involve stretching exercises, strengthening exercises, and posture training. Exercises and improved posture can help stabilize the neck, strengthen muscles, and help stop symptoms from returning.  HOME CARE INSTRUCTIONS   Put ice on the injured area.   Put ice in a plastic bag.   Place a  towel between your skin and the bag.   Leave the ice on for 15-20 minutes, 3-4 times a day.   If your injury was severe, you may have been given a cervical collar to wear. A cervical collar is a two-piece collar designed to keep your neck from moving while it heals.  Do not remove the collar unless instructed by your health care provider.  If you have long hair, keep it outside of the collar.  Ask your health care provider before making any adjustments to your collar. Minor adjustments may be required over time to improve comfort and reduce pressure on your chin or on the back of your head.  Ifyou are allowed to remove the collar for cleaning or bathing, follow your health care provider's instructions on how to do so safely.  Keep your collar clean by wiping it with mild soap and water and drying it completely. If the collar you have been given includes removable pads, remove them every  1-2 days and hand wash them with soap and water. Allow them to air dry. They should be completely dry before you wear them in the collar.  If you are allowed to remove the collar for cleaning and bathing, wash and dry the skin of your neck. Check your skin for irritation or sores. If you see any, tell your health care provider.  Do not drive while wearing the collar.   Only take over-the-counter or prescription medicines for pain, discomfort, or fever as directed by your health care provider.   Keep all follow-up appointments as directed by your health care provider.   Keep all physical therapy appointments as directed by your health care provider.   Make any needed adjustments to your workstation to promote good posture.   Avoid positions and activities that make your symptoms worse.   Warm up and stretch before being active to help prevent problems.  SEEK MEDICAL CARE IF:   Your pain is not controlled with medicine.   You are unable to decrease your pain medicine over time as planned.    Your activity level is not improving as expected.  SEEK IMMEDIATE MEDICAL CARE IF:   You develop any bleeding.  You develop stomach upset.  You have signs of an allergic reaction to your medicine.   Your symptoms get worse.   You develop new, unexplained symptoms.   You have numbness, tingling, weakness, or paralysis in any part of your body.  MAKE SURE YOU:   Understand these instructions.  Will watch your condition.  Will get help right away if you are not doing well or get worse.   This information is not intended to replace advice given to you by your health care provider. Make sure you discuss any questions you have with your health care provider.   Document Released: 05/18/2007 Document Revised: 07/26/2013 Document Reviewed: 01/26/2013 Elsevier Interactive Patient Education 2016 [employer]. Tourist information centre manager It is common to have multiple bruises and sore muscles after a motor vehicle collision (MVC). These tend to feel worse for the first 24 hours. You may have the most stiffness and soreness over the first several hours. You may also feel worse when you wake up the first morning after your collision. After this point, you will usually begin to improve with each day. The speed of improvement often depends on the severity of the collision, the number of injuries, and the location and nature of these injuries. HOME CARE INSTRUCTIONS  Put ice on the injured area.  Put ice in a plastic bag.  Place a towel between your skin and the bag.  Leave the ice on for 15-20 minutes, 3-4 times a day, or as directed by your health care provider.  Drink enough fluids to keep your urine clear or pale yellow. Do not drink alcohol.  Take a warm shower or bath once or twice a day. This will increase blood flow to sore muscles.  You may return to activities as directed by your caregiver. Be careful when lifting, as this may aggravate neck or back pain.  Only take  over-the-counter or prescription medicines for pain, discomfort, or fever as directed by your caregiver. Do not use aspirin. This may increase bruising and bleeding. SEEK IMMEDIATE MEDICAL CARE IF:  You have numbness, tingling, or weakness in the arms or legs.  You develop severe headaches not relieved with medicine.  You have severe neck pain, especially tenderness in the middle of the back of your neck.  You have changes in bowel or bladder control.  There is increasing pain in any area of the body.  You have shortness of breath, light-headedness, dizziness, or fainting.  You have chest pain.  You feel sick to your stomach (nauseous), throw up (vomit), or sweat.  You have increasing abdominal discomfort.  There is blood in your urine, stool, or vomit.  You have pain in your shoulder (shoulder strap areas).  You feel your symptoms are getting worse. MAKE SURE YOU:  Understand these instructions.  Will watch your condition.  Will get help right away if you are not doing well or get worse.   This information is not intended to replace advice given to you by your health care provider. Make sure you discuss any questions you have with your health care provider.   Document Released: 07/21/2005 Document Revised: 08/11/2014 Document Reviewed: 12/18/2010 Elsevier Interactive Patient Education 2016 [employer] Inc[employer] Contusion A contusion is a deep bruise. Contusions are the result of a blunt injury to tissues and muscle fibers under the skin. The injury causes bleeding under the skin. The skin overlying the contusion may turn blue, purple, or yellow. Minor injuries will give you a painless contusion, but more severe contusions may stay painful and swollen for a few weeks.  CAUSES  This condition is usually caused by a blow, trauma, or direct force to an area of the body. SYMPTOMS  Symptoms of this condition include:  Swelling of the injured area.  Pain and tenderness in the  injured area.  Discoloration. The area may have redness and then turn blue, purple, or yellow. DIAGNOSIS  This condition is diagnosed based on a physical exam and medical history. An X-ray, CT scan, or MRI may be needed to determine if there are any associated injuries, such as broken bones (fractures). TREATMENT  Specific treatment for this condition depends on what area of the body was injured. In general, the best treatment for a contusion is resting, icing, applying pressure to (compression), and elevating the injured area. This is often called the RICE strategy. Over-the-counter anti-inflammatory medicines may also be recommended for pain control.  HOME CARE INSTRUCTIONS   Rest the injured area.  If directed, apply ice to the injured area:  Put ice in a plastic bag.  Place a towel between your skin and the bag.  Leave the ice on for 20 minutes, 2-3 times per day.  If directed, apply light compression to the injured area using an elastic bandage. Make sure the bandage is not wrapped too tightly. Remove and reapply the bandage as directed by your health care provider.  If possible, raise (elevate) the injured area above the level of your heart while you are sitting or lying down.  Take over-the-counter and prescription medicines only as told by your health care provider. SEEK MEDICAL CARE IF:  Your symptoms do not improve after several days of treatment.  Your symptoms get worse.  You have difficulty moving the injured area. SEEK IMMEDIATE MEDICAL CARE IF:   You have severe pain.  You have numbness in a hand or foot.  Your hand or foot turns pale or cold.   This information is not intended to replace advice given to you by your health care provider. Make sure you discuss any questions you have with your health care provider.   Document Released: 04/30/2005 Document Revised: 04/11/2015 Document Reviewed: 12/06/2014 Elsevier Interactive Patient Education [employer].

## 2016-12-23 ENCOUNTER — Ambulatory Visit: Payer: Self-pay | Admitting: Orthopedic Surgery

## 2017-01-15 ENCOUNTER — Encounter (HOSPITAL_COMMUNITY): Payer: Self-pay

## 2017-01-15 NOTE — Patient Instructions (Signed)
Lyan Bertram DenverS Lackey  01/15/2017   Your procedure is scheduled on: 01/26/2017    Report to [facility] Main  Entrance .  Report to admitting at   0800 AM   Call this number if you have problems the morning of surgery  [phone]   Remember: ONLY 1 PERSON MAY GO WITH YOU TO SHORT STAY TO GET  READY MORNING OF YOUR SURGERY.  Do not eat food or drink liquids :After Midnight.     Take these medicines the morning of surgery with A SIP OF WATER: zyrtec, nexium , gabapentin                                 You may not have any metal on your body including hair pins and              piercings  Do not wear jewelry, make-up, lotions, powders or perfumes, deodorant             Do not wear nail polish.  Do not shave  48 hours prior to surgery.                Do not bring valuables to the hospital. [facility] IS NOT             RESPONSIBLE   FOR VALUABLES.  Contacts, dentures or bridgework may not be worn into surgery.  Leave suitcase in the car. After surgery it may be brought to your room.                     Please read over the following fact sheets you were given: _____________________________________________________________________             [facility] - Preparing for Surgery Before surgery, you can play an important role.  Because skin is not sterile, your skin needs to be as free of germs as possible.  You can reduce the number of germs on your skin by washing with CHG (chlorahexidine gluconate) soap before surgery.  CHG is an antiseptic cleaner which kills germs and bonds with the skin to continue killing germs even after washing. Please DO NOT use if you have an allergy to CHG or antibacterial soaps.  If your skin becomes reddened/irritated stop using the CHG and inform your nurse when you arrive at Short Stay. Do not shave (including legs and underarms) for at least 48 hours prior to the first CHG shower.  You may shave your face/neck. Please follow  these instructions carefully:  1.  Shower with CHG Soap the night before surgery and the  morning of Surgery.  2.  If you choose to wash your hair, wash your hair first as usual with your  normal  shampoo.  3.  After you shampoo, rinse your hair and body thoroughly to remove the  shampoo.                           4.  Use CHG as you would any other liquid soap.  You can apply chg directly  to the skin and wash                       Gently with a scrungie or clean washcloth.  5.  Apply the CHG  Soap to your body ONLY FROM THE NECK DOWN.   Do not use on face/ open                           Wound or open sores. Avoid contact with eyes, ears mouth and genitals (private parts).                       Wash face,  Genitals (private parts) with your normal soap.             6.  Wash thoroughly, paying special attention to the area where your surgery  will be performed.  7.  Thoroughly rinse your body with warm water from the neck down.  8.  DO NOT shower/wash with your normal soap after using and rinsing off  the CHG Soap.                9.  Pat yourself dry with a clean towel.            10.  Wear clean pajamas.            11.  Place clean sheets on your bed the night of your first shower and do not  sleep with pets. Day of Surgery : Do not apply any lotions/deodorants the morning of surgery.  Please wear clean clothes to the hospital/surgery center.  FAILURE TO FOLLOW THESE INSTRUCTIONS MAY RESULT IN THE CANCELLATION OF YOUR SURGERY PATIENT SIGNATURE_________________________________  NURSE SIGNATURE__________________________________  ________________________________________________________________________  WHAT IS A BLOOD TRANSFUSION? Blood Transfusion Information  A transfusion is the replacement of blood or some of its parts. Blood is made up of multiple cells which provide different functions.  Red blood cells carry oxygen and are used for blood loss replacement.  White blood cells fight  against infection.  Platelets control bleeding.  Plasma helps clot blood.  Other blood products are available for specialized needs, such as hemophilia or other clotting disorders. BEFORE THE TRANSFUSION  Who gives blood for transfusions?   Healthy volunteers who are fully evaluated to make sure their blood is safe. This is blood bank blood. Transfusion therapy is the safest it has ever been in the practice of medicine. Before blood is taken from a donor, a complete history is taken to make sure that person has no history of diseases nor engages in risky social behavior (examples are intravenous drug use or sexual activity with multiple partners). The donor's travel history is screened to minimize risk of transmitting infections, such as malaria. The donated blood is tested for signs of infectious diseases, such as HIV and hepatitis. The blood is then tested to be sure it is compatible with you in order to minimize the chance of a transfusion reaction. If you or a relative donates blood, this is often done in anticipation of surgery and is not appropriate for emergency situations. It takes many days to process the donated blood. RISKS AND COMPLICATIONS Although transfusion therapy is very safe and saves many lives, the main dangers of transfusion include:   Getting an infectious disease.  Developing a transfusion reaction. This is an allergic reaction to something in the blood you were given. Every precaution is taken to prevent this. The decision to have a blood transfusion has been considered carefully by your caregiver before blood is given. Blood is not given unless the benefits outweigh the risks. AFTER THE TRANSFUSION  Right after receiving a blood transfusion, you  will usually feel much better and more energetic. This is especially true if your red blood cells have gotten low (anemic). The transfusion raises the level of the red blood cells which carry oxygen, and this usually causes an  energy increase.  The nurse administering the transfusion will monitor you carefully for complications. HOME CARE INSTRUCTIONS  No special instructions are needed after a transfusion. You may find your energy is better. Speak with your caregiver about any limitations on activity for underlying diseases you may have. SEEK MEDICAL CARE IF:   Your condition is not improving after your transfusion.  You develop redness or irritation at the intravenous (IV) site. SEEK IMMEDIATE MEDICAL CARE IF:  Any of the following symptoms occur over the next 12 hours:  Shaking chills.  You have a temperature by mouth above 102 F (38.9 C), not controlled by medicine.  Chest, back, or muscle pain.  People around you feel you are not acting correctly or are confused.  Shortness of breath or difficulty breathing.  Dizziness and fainting.  You get a rash or develop hives.  You have a decrease in urine output.  Your urine turns a dark color or changes to pink, red, or brown. Any of the following symptoms occur over the next 10 days:  You have a temperature by mouth above 102 F (38.9 C), not controlled by medicine.  Shortness of breath.  Weakness after normal activity.  The white part of the eye turns yellow (jaundice).  You have a decrease in the amount of urine or are urinating less often.  Your urine turns a dark color or changes to pink, red, or brown. Document Released: 07/18/2000 Document Revised: 10/13/2011 Document Reviewed: 03/06/2008 ExitCare Patient Information 2014 [place].  _______________________________________________________________________  Incentive Spirometer  An incentive spirometer is a tool that can help keep your lungs clear and active. This tool measures how well you are filling your lungs with each breath. Taking long deep breaths may help reverse or decrease the chance of developing breathing (pulmonary) problems (especially infection) following:  A  long period of time when you are unable to move or be active. BEFORE THE PROCEDURE   If the spirometer includes an indicator to show your best effort, your nurse or respiratory therapist will set it to a desired goal.  If possible, sit up straight or lean slightly forward. Try not to slouch.  Hold the incentive spirometer in an upright position. INSTRUCTIONS FOR USE  1. Sit on the edge of your bed if possible, or sit up as far as you can in bed or on a chair. 2. Hold the incentive spirometer in an upright position. 3. Breathe out normally. 4. Place the mouthpiece in your mouth and seal your lips tightly around it. 5. Breathe in slowly and as deeply as possible, raising the piston or the ball toward the top of the column. 6. Hold your breath for 3-5 seconds or for as long as possible. Allow the piston or ball to fall to the bottom of the column. 7. Remove the mouthpiece from your mouth and breathe out normally. 8. Rest for a few seconds and repeat Steps 1 through 7 at least 10 times every 1-2 hours when you are awake. Take your time and take a few normal breaths between deep breaths. 9. The spirometer may include an indicator to show your best effort. Use the indicator as a goal to work toward during each repetition. 10. After each set of 10 deep breaths, practice  coughing to be sure your lungs are clear. If you have an incision (the cut made at the time of surgery), support your incision when coughing by placing a pillow or rolled up towels firmly against it. Once you are able to get out of bed, walk around indoors and cough well. You may stop using the incentive spirometer when instructed by your caregiver.  RISKS AND COMPLICATIONS  Take your time so you do not get dizzy or light-headed.  If you are in pain, you may need to take or ask for pain medication before doing incentive spirometry. It is harder to take a deep breath if you are having pain. AFTER USE  Rest and breathe slowly and  easily.  It can be helpful to keep track of a log of your progress. Your caregiver can provide you with a simple table to help with this. If you are using the spirometer at home, follow these instructions: [place] IF:   You are having difficultly using the spirometer.  You have trouble using the spirometer as often as instructed.  Your pain medication is not giving enough relief while using the spirometer.  You develop fever of 100.5 F (38.1 C) or higher. SEEK IMMEDIATE MEDICAL CARE IF:   You cough up bloody sputum that had not been present before.  You develop fever of 102 F (38.9 C) or greater.  You develop worsening pain at or near the incision site. MAKE SURE YOU:   Understand these instructions.  Will watch your condition.  Will get help right away if you are not doing well or get worse. Document Released: 12/01/2006 Document Revised: 10/13/2011 Document Reviewed: 02/01/2007 [facility] Patient Information 2014 [place], [place].   ________________________________________________________________________

## 2017-01-19 ENCOUNTER — Encounter (HOSPITAL_COMMUNITY)
Admission: RE | Admit: 2017-01-19 | Discharge: 2017-01-19 | Disposition: A | Discharge status: Home / Self Care | Payer: Medicare Other | Source: Ambulatory Visit | Attending: Orthopedic Surgery | Admitting: Orthopedic Surgery

## 2017-01-19 ENCOUNTER — Encounter (INDEPENDENT_AMBULATORY_CARE_PROVIDER_SITE_OTHER): Payer: Self-pay

## 2017-01-19 ENCOUNTER — Encounter (HOSPITAL_COMMUNITY): Payer: Self-pay

## 2017-01-19 DIAGNOSIS — Z01812 Encounter for preprocedural laboratory examination: Secondary | ICD-10-CM | POA: Diagnosis present

## 2017-01-19 DIAGNOSIS — M1712 Unilateral primary osteoarthritis, left knee: Secondary | ICD-10-CM | POA: Diagnosis not present

## 2017-01-19 HISTORY — DX: Personal history of urinary calculi: Z87.442

## 2017-01-19 LAB — CBC
HCT: 34.2 % — ABNORMAL LOW (ref 36.0–46.0)
HEMOGLOBIN: 10.4 g/dL — AB (ref 12.0–15.0)
MCH: 25.1 pg — AB (ref 26.0–34.0)
MCHC: 30.4 g/dL (ref 30.0–36.0)
MCV: 82.4 fL (ref 78.0–100.0)
PLATELETS: 221 10*3/uL (ref 150–400)
RBC: 4.15 MIL/uL (ref 3.87–5.11)
RDW: 17.5 % — ABNORMAL HIGH (ref 11.5–15.5)
WBC: 6 10*3/uL (ref 4.0–10.5)

## 2017-01-19 LAB — ABO/RH: ABO/RH(D): O POS

## 2017-01-19 LAB — PROTIME-INR
INR: 0.92
Prothrombin Time: 12.3 seconds (ref 11.4–15.2)

## 2017-01-19 LAB — COMPREHENSIVE METABOLIC PANEL
ALK PHOS: 143 U/L — AB (ref 38–126)
ALT: 12 U/L — ABNORMAL LOW (ref 14–54)
AST: 17 U/L (ref 15–41)
Albumin: 4 g/dL (ref 3.5–5.0)
Anion gap: 4 — ABNORMAL LOW (ref 5–15)
BUN: 12 mg/dL (ref 6–20)
CO2: 28 mmol/L (ref 22–32)
CREATININE: 0.88 mg/dL (ref 0.44–1.00)
Calcium: 9.4 mg/dL (ref 8.9–10.3)
Chloride: 107 mmol/L (ref 101–111)
GFR calc non Af Amer: >60 mL/min (ref >60)
Glucose, Bld: 100 mg/dL — ABNORMAL HIGH (ref 65–99)
Potassium: 4.8 mmol/L (ref 3.5–5.1)
SODIUM: 139 mmol/L (ref 135–145)
Total Bilirubin: 0.2 mg/dL — ABNORMAL LOW (ref 0.3–1.2)
Total Protein: 7.1 g/dL (ref 6.5–8.1)

## 2017-01-19 LAB — APTT: aPTT: 27 seconds (ref 24–36)

## 2017-01-19 LAB — SURGICAL PCR SCREEN
MRSA, PCR: NEGATIVE
STAPHYLOCOCCUS AUREUS: NEGATIVE

## 2017-01-19 NOTE — Progress Notes (Signed)
CBC done 01/19/17 faxed via epic to Dr Lequita HaltAluisio.

## 2017-01-19 NOTE — Progress Notes (Signed)
Clearance-12/02/16- Dr Charm BargesButler on chart

## 2017-01-24 ENCOUNTER — Ambulatory Visit: Payer: Self-pay | Admitting: Orthopedic Surgery

## 2017-01-24 NOTE — H&P (Signed)
[name] [name] DOB: March 13, 1961 Divorced / Language: English / Race: White Female Date of Admission:  01/26/2017 CC:  Left Knee Pain History of Present Illness  The patient is a 56 year old female who comes in for a preoperative History and Physical. The patient is scheduled for a left total knee arthroplasty to be performed by Dr. Gus Rankin. Aluisio, MD at [facility] on 01-26-2017. The patient is a 56 year old female who reported left knee greater than right knee symptoms including: pain, swelling, catching, giving way, stiffness and popping which began year(s) ago without any known injury. The patient describes the severity of the symptoms as severe.The patient feels that the symptoms are worsening. Past treatment for this problem has included knee brace. Onset of symptoms was with symptoms now occurring constantly. She saw Dr Linna Caprice in January and was not satisfied with that visit. She has been having problems with her left knee for many years now. It is at the stage where it is hurting at all times. It is even keeping her up at night. It is limiting what she can and cannot do. 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 She is no longer working. She says she stays home most of the day. Even despite the inactivity, her knees are very problematic for her. The right hurts also, but not to the same degree as the left one does. She is not having associated hip pain. She does not get swelling in the knees. They occasionally feel like they want to give out but not routinely. She has not had locking episodes. AP and lateral of both knees show severe end-stage medial compartment with bone-on-bone arthritis in both knees, left worse than the right. She also has patellofemoral bone on bone on the left worse than right. She has advanced end-stage arthritic changes in the left worse than right knee with symptoms worse on the left than the  right. She has had cortisone and viscosupplements without benefit. At this point, the most predictable means of improving her pain and function will be total knee arthroplasty. She is ready to proceed at this time. They have been treated conservatively in the past for the above stated problem and despite conservative measures, they continue to have progressive pain and severe functional limitations and dysfunction. They have failed non-operative management including home exercise, medications, and injections. It is felt that they would benefit from undergoing total joint replacement. Risks and benefits of the procedure have been discussed with the patient and they elect to proceed with surgery. There are no active contraindications to surgery such as ongoing infection or rapidly progressive neurological disease.  Problem List/Past Medical Primary localized osteoarthritis of left knee (M17.12)  Migraine Headache  Kidney Stone   Allergies  Penicillin V *PENICILLINS*   Family History Chronic Obstructive Lung Disease  Maternal Grandmother, Mother. Congestive Heart Failure  Paternal Grandfather. Diabetes Mellitus  Father, Maternal Grandmother, Mother. Drug / Alcohol Addiction  Maternal Grandfather. Hypertension  Father, Mother, Sister. Liver Disease, Chronic  Maternal Grandfather. Osteoarthritis  Maternal Grandmother, Mother, Paternal Grandmother, Sister. Rheumatoid Arthritis  Paternal Grandmother.  Social History  Children  1 Current drinker  08/14/2016: Currently drinks beer and wine only occasionally per week Current work status  disabled Exercise  Exercises rarely; does other Living situation  live with parents Marital status  divorced No history of drug/alcohol rehab  Number of flights of stairs  before winded  2-3 Tobacco / smoke exposure  08/14/2016: yes outdoors only Tobacco use  Never smoker. 08/14/2016  Medication History Gabapentin (300MG  Tablet, Oral  every evening) Active. Hydrocodone-Acetaminophen (10-325MG  Tablet, Oral every 4 to 6 hours as needed for pain) Active. (from her PCP) Furosemide (40MG  Tablet, Oral as needed for swelling) Active. Naproxen (500MG  Tablet, Oral twice a day) Active.   Past Surgical History Umbilical Hernia Repair  Date: 561979. Tubaligation  Date: 311992.   Review of Systems General Present- Night Sweats. Not Present- Chills, Fatigue, Fever, Memory Loss, Weight Gain and Weight Loss. Skin Not Present- Eczema, Hives, Itching, Lesions and Rash. HEENT Not Present- Dentures, Double Vision, Headache, Hearing Loss, Tinnitus and Visual Loss. Respiratory Not Present- Allergies, Chronic Cough, Coughing up blood, Shortness of breath at rest and Shortness of breath with exertion. Cardiovascular Not Present- Chest Pain, Difficulty Breathing Lying Down, Murmur, Palpitations, Racing/skipping heartbeats and Swelling. Gastrointestinal Present- Heartburn. Not Present- Abdominal Pain, Bloody Stool, Constipation, Diarrhea, Difficulty Swallowing, Jaundice, Loss of appetitie, Nausea and Vomiting. Female Genitourinary Not Present- Blood in Urine, Discharge, Flank Pain, Incontinence, Painful Urination, Urgency, Urinary frequency, Urinary Retention, Urinating at Night and Weak urinary stream. Musculoskeletal Present- Morning Stiffness. Not Present- Back Pain, Joint Pain, Joint Swelling, Muscle Pain, Muscle Weakness and Spasms. Neurological Not Present- Blackout spells, Difficulty with balance, Dizziness, Paralysis, Tremor and Weakness. Psychiatric Not Present- Insomnia.  Vitals Weight: 171 lb Height: 65in Body Surface Area: 1.85 m Body Mass Index: 28.46 kg/m  Pulse: 76 (Regular)  BP: 142/84 (Sitting, Right Arm, Standard)    Physical Exam  General Mental Status -Alert, cooperative and good historian. General Appearance-pleasant, Not in acute distress. Orientation-Oriented X3. Build & Nutrition-Well  nourished and Well developed.  Head and Neck Head-normocephalic, atraumatic . Neck Global Assessment - supple, no bruit auscultated on the right, no bruit auscultated on the left.  Eye Vision-Wears corrective lenses(mainly readers). Pupil - Bilateral-Regular and Round. Motion - Bilateral-EOMI.  Chest and Lung Exam Auscultation Breath sounds - clear at anterior chest wall and clear at posterior chest wall. Adventitious sounds - No Adventitious sounds.  Cardiovascular Auscultation Rhythm - Regular rate and rhythm. Heart Sounds - S1 WNL and S2 WNL. Murmurs & Other Heart Sounds - Auscultation of the heart reveals - No Murmurs.  Abdomen Palpation/Percussion Tenderness - Abdomen is non-tender to palpation. Rigidity (guarding) - Abdomen is soft. Auscultation Auscultation of the abdomen reveals - Bowel sounds normal.  Female Genitourinary Note: Not done, not pertinent to present illness   Musculoskeletal Note: A well-developed female in no distress. Both hips have normal range of motion with no discomfort. The left knee, no effusion. Range about 5 to 120. Marked crepitus on range of motion. Tenderness medial greater than lateral with no instability. Right knee, no effusion. Range about 5 to 125. Marked crepitus on range of motion. Tender medial greater than lateral with no instability. Pulse, sensation and motor are intact distally. She has a significantly antalgic gait pattern.  RADIOGRAPHS AP and lateral of both knees show severe end-stage medial compartment with bone-on-bone arthritis in both knees, left worse than the right. She also has patellofemoral bone on bone on the left worse than right.  Assessment & Plan Primary localized osteoarthritis of left knee (M17.12) Primary localized osteoarthritis of right knee (M17.11)  Note:Surgical Plans: Left Total Knee Replacement  Disposition: Home vs. Rehab  PCP: Dr. Samuel Jesterynthia Butler - Patient has been seen preoperatively  and felt to be stable for surgery.  IV TXA  Anesthesia Issues:  None  Patient was instructed on what medications to stop prior to surgery.  Signed electronically by Lauraine Rinne, III PA-C

## 2017-01-26 ENCOUNTER — Encounter (HOSPITAL_COMMUNITY): Payer: Self-pay | Admitting: *Deleted

## 2017-01-26 ENCOUNTER — Encounter (HOSPITAL_COMMUNITY)
Admission: RE | Disposition: A | Discharge status: Home / Self Care | Payer: Self-pay | Source: Ambulatory Visit | Attending: Orthopedic Surgery

## 2017-01-26 ENCOUNTER — Inpatient Hospital Stay (HOSPITAL_COMMUNITY)
Admission: RE | Admit: 2017-01-26 | Discharge: 2017-01-28 | DRG: 470 | Disposition: A | Discharge status: Home / Self Care | Payer: Medicare Other | Source: Ambulatory Visit | Attending: Orthopedic Surgery | Admitting: Orthopedic Surgery

## 2017-01-26 ENCOUNTER — Inpatient Hospital Stay (HOSPITAL_COMMUNITY): Payer: Medicare Other | Admitting: Anesthesiology

## 2017-01-26 DIAGNOSIS — M170 Bilateral primary osteoarthritis of knee: Secondary | ICD-10-CM | POA: Diagnosis present

## 2017-01-26 DIAGNOSIS — Z880 Allergy status to penicillin: Secondary | ICD-10-CM

## 2017-01-26 DIAGNOSIS — G43909 Migraine, unspecified, not intractable, without status migrainosus: Secondary | ICD-10-CM | POA: Diagnosis present

## 2017-01-26 DIAGNOSIS — K219 Gastro-esophageal reflux disease without esophagitis: Secondary | ICD-10-CM | POA: Diagnosis present

## 2017-01-26 DIAGNOSIS — M1710 Unilateral primary osteoarthritis, unspecified knee: Secondary | ICD-10-CM | POA: Diagnosis present

## 2017-01-26 DIAGNOSIS — M1712 Unilateral primary osteoarthritis, left knee: Secondary | ICD-10-CM | POA: Diagnosis present

## 2017-01-26 DIAGNOSIS — G2581 Restless legs syndrome: Secondary | ICD-10-CM | POA: Diagnosis present

## 2017-01-26 DIAGNOSIS — M179 Osteoarthritis of knee, unspecified: Secondary | ICD-10-CM

## 2017-01-26 HISTORY — PX: TOTAL KNEE ARTHROPLASTY: SHX125

## 2017-01-26 LAB — TYPE AND SCREEN
ABO/RH(D): O POS
Antibody Screen: NEGATIVE

## 2017-01-26 SURGERY — ARTHROPLASTY, KNEE, TOTAL
Anesthesia: Monitor Anesthesia Care | Site: Knee | Laterality: Left

## 2017-01-26 MED ORDER — DEXAMETHASONE SODIUM PHOSPHATE 10 MG/ML IJ SOLN
10.0000 mg | Freq: Once | INTRAMUSCULAR | Status: AC
  Administered 2017-01-26: 10 mg via INTRAVENOUS

## 2017-01-26 MED ORDER — METHOCARBAMOL 500 MG PO TABS
500.0000 mg | ORAL_TABLET | Freq: Four times a day (QID) | ORAL | Status: DC | PRN
  Administered 2017-01-26 – 2017-01-28 (×6): 500 mg via ORAL
  Filled 2017-01-26 (×6): qty 1

## 2017-01-26 MED ORDER — DOCUSATE SODIUM 100 MG PO CAPS
100.0000 mg | ORAL_CAPSULE | Freq: Two times a day (BID) | ORAL | Status: DC
  Administered 2017-01-26 – 2017-01-28 (×4): 100 mg via ORAL
  Filled 2017-01-26 (×4): qty 1

## 2017-01-26 MED ORDER — BUPIVACAINE LIPOSOME 1.3 % IJ SUSP
INTRAMUSCULAR | Status: DC | PRN
  Administered 2017-01-26: 20 mL

## 2017-01-26 MED ORDER — MAGNESIUM OXIDE 400 (241.3 MG) MG PO TABS
400.0000 mg | ORAL_TABLET | Freq: Every day | ORAL | Status: DC
  Administered 2017-01-27 – 2017-01-28 (×2): 400 mg via ORAL
  Filled 2017-01-26 (×2): qty 1

## 2017-01-26 MED ORDER — ACETAMINOPHEN 10 MG/ML IV SOLN
1000.0000 mg | Freq: Once | INTRAVENOUS | Status: AC
  Administered 2017-01-26: 1000 mg via INTRAVENOUS

## 2017-01-26 MED ORDER — PROPOFOL 500 MG/50ML IV EMUL
INTRAVENOUS | Status: DC | PRN
  Administered 2017-01-26: 65 ug/kg/min via INTRAVENOUS

## 2017-01-26 MED ORDER — ONDANSETRON HCL 4 MG/2ML IJ SOLN
INTRAMUSCULAR | Status: AC
  Filled 2017-01-26: qty 2

## 2017-01-26 MED ORDER — LORATADINE 10 MG PO TABS
10.0000 mg | ORAL_TABLET | Freq: Every day | ORAL | Status: DC
  Administered 2017-01-27 – 2017-01-28 (×2): 10 mg via ORAL
  Filled 2017-01-26 (×2): qty 1

## 2017-01-26 MED ORDER — SODIUM CHLORIDE 0.9 % IJ SOLN
INTRAMUSCULAR | Status: AC
  Filled 2017-01-26: qty 50

## 2017-01-26 MED ORDER — BUPIVACAINE IN DEXTROSE 0.75-8.25 % IT SOLN
INTRATHECAL | Status: DC | PRN
  Administered 2017-01-26: 2 mL via INTRATHECAL

## 2017-01-26 MED ORDER — PHENOL 1.4 % MT LIQD
1.0000 | OROMUCOSAL | Status: DC | PRN
Start: 2017-01-26 — End: 2017-01-28

## 2017-01-26 MED ORDER — RIVAROXABAN 10 MG PO TABS
10.0000 mg | ORAL_TABLET | Freq: Every day | ORAL | Status: DC
  Administered 2017-01-27 – 2017-01-28 (×2): 10 mg via ORAL
  Filled 2017-01-26 (×2): qty 1

## 2017-01-26 MED ORDER — METOCLOPRAMIDE HCL 5 MG PO TABS: 5.0000 mg | ORAL_TABLET | Freq: Three times a day (TID) | ORAL | Status: DC | PRN

## 2017-01-26 MED ORDER — MIDAZOLAM HCL 2 MG/2ML IJ SOLN
INTRAMUSCULAR | Status: AC
  Filled 2017-01-26: qty 2

## 2017-01-26 MED ORDER — DIPHENHYDRAMINE HCL 12.5 MG/5ML PO ELIX: 12.5000 mg | ORAL_SOLUTION | ORAL | Status: DC | PRN

## 2017-01-26 MED ORDER — ACETAMINOPHEN 325 MG PO TABS: 650.0000 mg | ORAL_TABLET | Freq: Four times a day (QID) | ORAL | Status: DC | PRN

## 2017-01-26 MED ORDER — MORPHINE SULFATE (PF) 4 MG/ML IV SOLN
1.0000 mg | INTRAVENOUS | Status: DC | PRN
  Administered 2017-01-26 – 2017-01-27 (×3): 1 mg via INTRAVENOUS
  Filled 2017-01-26 (×3): qty 1

## 2017-01-26 MED ORDER — BISACODYL 10 MG RE SUPP: 10.0000 mg | Freq: Every day | RECTAL | Status: DC | PRN

## 2017-01-26 MED ORDER — PANTOPRAZOLE SODIUM 40 MG PO TBEC
40.0000 mg | DELAYED_RELEASE_TABLET | Freq: Every day | ORAL | Status: DC
  Administered 2017-01-27: 40 mg via ORAL
  Filled 2017-01-26: qty 1

## 2017-01-26 MED ORDER — ACETAMINOPHEN 650 MG RE SUPP: 650.0000 mg | Freq: Four times a day (QID) | RECTAL | Status: DC | PRN

## 2017-01-26 MED ORDER — ONDANSETRON HCL 4 MG/2ML IJ SOLN: 4.0000 mg | Freq: Four times a day (QID) | INTRAMUSCULAR | Status: DC | PRN

## 2017-01-26 MED ORDER — MENTHOL 3 MG MT LOZG: 1.0000 | LOZENGE | OROMUCOSAL | Status: DC | PRN

## 2017-01-26 MED ORDER — ROPIVACAINE HCL 5 MG/ML IJ SOLN
INTRAMUSCULAR | Status: DC | PRN
  Administered 2017-01-26: 20 mL via PERINEURAL

## 2017-01-26 MED ORDER — VANCOMYCIN HCL IN DEXTROSE 1-5 GM/200ML-% IV SOLN
1000.0000 mg | Freq: Two times a day (BID) | INTRAVENOUS | Status: AC
  Administered 2017-01-26: 1000 mg via INTRAVENOUS
  Filled 2017-01-26: qty 200

## 2017-01-26 MED ORDER — OXYCODONE HCL 5 MG PO TABS: 5.0000 mg | ORAL_TABLET | Freq: Once | ORAL | Status: DC | PRN

## 2017-01-26 MED ORDER — LACTATED RINGERS IV SOLN
INTRAVENOUS | Status: DC
Start: 2017-01-26 — End: 2017-01-26
  Administered 2017-01-26 (×2): via INTRAVENOUS

## 2017-01-26 MED ORDER — ACETAMINOPHEN 10 MG/ML IV SOLN
INTRAVENOUS | Status: AC
  Filled 2017-01-26: qty 100

## 2017-01-26 MED ORDER — STERILE WATER FOR IRRIGATION IR SOLN
Status: DC | PRN
  Administered 2017-01-26: 2000 mL

## 2017-01-26 MED ORDER — METHOCARBAMOL 1000 MG/10ML IJ SOLN
500.0000 mg | Freq: Four times a day (QID) | INTRAVENOUS | Status: DC | PRN
  Administered 2017-01-26: 500 mg via INTRAVENOUS
  Filled 2017-01-26: qty 550

## 2017-01-26 MED ORDER — ONDANSETRON HCL 4 MG PO TABS: 4.0000 mg | ORAL_TABLET | Freq: Four times a day (QID) | ORAL | Status: DC | PRN

## 2017-01-26 MED ORDER — SODIUM CHLORIDE 0.9 % IJ SOLN
INTRAMUSCULAR | Status: AC
  Filled 2017-01-26: qty 10

## 2017-01-26 MED ORDER — SODIUM CHLORIDE 0.9 % IR SOLN
Status: DC | PRN
  Administered 2017-01-26: 1000 mL

## 2017-01-26 MED ORDER — 0.9 % SODIUM CHLORIDE (POUR BTL) OPTIME
TOPICAL | Status: DC | PRN
Start: 2017-01-26 — End: 2017-01-26
  Administered 2017-01-26: 1000 mL

## 2017-01-26 MED ORDER — CHLORHEXIDINE GLUCONATE 4 % EX LIQD: 60.0000 mL | Freq: Once | CUTANEOUS | Status: DC

## 2017-01-26 MED ORDER — PROPOFOL 10 MG/ML IV BOLUS
INTRAVENOUS | Status: AC
  Filled 2017-01-26: qty 20

## 2017-01-26 MED ORDER — DEXAMETHASONE SODIUM PHOSPHATE 10 MG/ML IJ SOLN
INTRAMUSCULAR | Status: AC
  Filled 2017-01-26: qty 1

## 2017-01-26 MED ORDER — SODIUM CHLORIDE 0.9 % IV SOLN
1000.0000 mg | INTRAVENOUS | Status: AC
  Administered 2017-01-26: 1000 mg via INTRAVENOUS
  Filled 2017-01-26: qty 1100

## 2017-01-26 MED ORDER — PROMETHAZINE HCL 25 MG/ML IJ SOLN: 6.2500 mg | INTRAMUSCULAR | Status: DC | PRN

## 2017-01-26 MED ORDER — GABAPENTIN 300 MG PO CAPS
ORAL_CAPSULE | ORAL | Status: AC
  Administered 2017-01-26: 300 mg
  Filled 2017-01-26: qty 1

## 2017-01-26 MED ORDER — FENTANYL CITRATE (PF) 100 MCG/2ML IJ SOLN
100.0000 ug | Freq: Once | INTRAMUSCULAR | Status: AC
  Administered 2017-01-26: 100 ug via INTRAVENOUS

## 2017-01-26 MED ORDER — PROPOFOL 10 MG/ML IV BOLUS
INTRAVENOUS | Status: DC | PRN
  Administered 2017-01-26: 30 mg via INTRAVENOUS
  Administered 2017-01-26: 10 mg via INTRAVENOUS

## 2017-01-26 MED ORDER — LIDOCAINE 2% (20 MG/ML) 5 ML SYRINGE
INTRAMUSCULAR | Status: AC
  Filled 2017-01-26: qty 5

## 2017-01-26 MED ORDER — GABAPENTIN 300 MG PO CAPS: 300.0000 mg | ORAL_CAPSULE | Freq: Once | ORAL | Status: DC

## 2017-01-26 MED ORDER — BUPIVACAINE LIPOSOME 1.3 % IJ SUSP
20.0000 mL | Freq: Once | INTRAMUSCULAR | Status: DC
  Filled 2017-01-26: qty 20

## 2017-01-26 MED ORDER — FLEET ENEMA 7-19 GM/118ML RE ENEM: 1.0000 | ENEMA | Freq: Once | RECTAL | Status: DC | PRN

## 2017-01-26 MED ORDER — POLYETHYLENE GLYCOL 3350 17 G PO PACK: 17.0000 g | PACK | Freq: Every day | ORAL | Status: DC | PRN

## 2017-01-26 MED ORDER — MIDAZOLAM HCL 2 MG/2ML IJ SOLN
2.0000 mg | Freq: Once | INTRAMUSCULAR | Status: AC
  Administered 2017-01-26: 2 mg via INTRAVENOUS

## 2017-01-26 MED ORDER — TRANEXAMIC ACID 1000 MG/10ML IV SOLN
1000.0000 mg | Freq: Once | INTRAVENOUS | Status: AC
  Administered 2017-01-26: 15:00:00 1000 mg via INTRAVENOUS
  Filled 2017-01-26: qty 1100

## 2017-01-26 MED ORDER — FENTANYL CITRATE (PF) 100 MCG/2ML IJ SOLN
INTRAMUSCULAR | Status: AC
  Filled 2017-01-26: qty 2

## 2017-01-26 MED ORDER — OXYCODONE HCL 5 MG/5ML PO SOLN: 5.0000 mg | Freq: Once | ORAL | Status: DC | PRN

## 2017-01-26 MED ORDER — HYDROMORPHONE HCL 1 MG/ML IJ SOLN: 0.2500 mg | INTRAMUSCULAR | Status: DC | PRN

## 2017-01-26 MED ORDER — TRAMADOL HCL 50 MG PO TABS
50.0000 mg | ORAL_TABLET | Freq: Four times a day (QID) | ORAL | Status: DC | PRN
  Administered 2017-01-27 (×2): 100 mg via ORAL
  Filled 2017-01-26 (×2): qty 2

## 2017-01-26 MED ORDER — GABAPENTIN 400 MG PO CAPS
400.0000 mg | ORAL_CAPSULE | Freq: Every day | ORAL | Status: DC
  Administered 2017-01-26: 400 mg via ORAL
  Filled 2017-01-26: qty 1

## 2017-01-26 MED ORDER — FUROSEMIDE 20 MG PO TABS: 20.0000 mg | ORAL_TABLET | Freq: Every day | ORAL | Status: DC | PRN

## 2017-01-26 MED ORDER — METOCLOPRAMIDE HCL 5 MG/ML IJ SOLN: 5.0000 mg | Freq: Three times a day (TID) | INTRAMUSCULAR | Status: DC | PRN

## 2017-01-26 MED ORDER — DEXAMETHASONE SODIUM PHOSPHATE 10 MG/ML IJ SOLN
10.0000 mg | Freq: Once | INTRAMUSCULAR | Status: AC
  Administered 2017-01-27: 08:00:00 10 mg via INTRAVENOUS
  Filled 2017-01-26: qty 1

## 2017-01-26 MED ORDER — SODIUM CHLORIDE 0.9 % IJ SOLN
INTRAMUSCULAR | Status: DC | PRN
  Administered 2017-01-26: 60 mL

## 2017-01-26 MED ORDER — CEFAZOLIN SODIUM-DEXTROSE 2-4 GM/100ML-% IV SOLN
INTRAVENOUS | Status: AC
  Filled 2017-01-26: qty 100

## 2017-01-26 MED ORDER — ONDANSETRON HCL 4 MG/2ML IJ SOLN
INTRAMUSCULAR | Status: DC | PRN
  Administered 2017-01-26: 4 mg via INTRAVENOUS

## 2017-01-26 MED ORDER — CEFAZOLIN SODIUM-DEXTROSE 2-4 GM/100ML-% IV SOLN
2.0000 g | INTRAVENOUS | Status: AC
  Administered 2017-01-26: 2 g via INTRAVENOUS

## 2017-01-26 MED ORDER — MAGNESIUM OXIDE 400 MG PO TABS: 400.0000 mg | ORAL_TABLET | Freq: Every day | ORAL | Status: DC

## 2017-01-26 MED ORDER — OXYCODONE HCL 5 MG PO TABS
5.0000 mg | ORAL_TABLET | ORAL | Status: DC | PRN
  Administered 2017-01-26 (×2): 10 mg via ORAL
  Administered 2017-01-26: 15:00:00 5 mg via ORAL
  Administered 2017-01-27 (×4): 10 mg via ORAL
  Filled 2017-01-26: qty 1
  Filled 2017-01-26 (×6): qty 2

## 2017-01-26 MED ORDER — SODIUM CHLORIDE 0.9 % IV SOLN
INTRAVENOUS | Status: DC
  Administered 2017-01-26: 15:00:00 via INTRAVENOUS

## 2017-01-26 SURGICAL SUPPLY — 51 items
BAG DECANTER FOR FLEXI CONT (MISCELLANEOUS) ×1 IMPLANT
BAG SPEC THK2 15X12 ZIP CLS (MISCELLANEOUS) ×1
BAG ZIPLOCK 12X15 (MISCELLANEOUS) ×3 IMPLANT
BANDAGE ACE 6X5 VEL STRL LF (GAUZE/BANDAGES/DRESSINGS) ×3 IMPLANT
BLADE SAG 18X100X1.27 (BLADE) ×3 IMPLANT
BLADE SAW SGTL 11.0X1.19X90.0M (BLADE) ×3 IMPLANT
BOWL SMART MIX CTS (DISPOSABLE) ×3 IMPLANT
CAPT KNEE TOTAL 3 ATTUNE ×2 IMPLANT
CEMENT HV SMART SET (Cement) ×6 IMPLANT
CLOSURE WOUND 1/2 X4 (GAUZE/BANDAGES/DRESSINGS) ×2
COVER SURGICAL LIGHT HANDLE (MISCELLANEOUS) ×3 IMPLANT
CUFF TOURN SGL QUICK 34 (TOURNIQUET CUFF) ×3
CUFF TRNQT CYL 34X4X40X1 (TOURNIQUET CUFF) ×1 IMPLANT
DECANTER SPIKE VIAL GLASS SM (MISCELLANEOUS) ×3 IMPLANT
DRAPE U-SHAPE 47X51 STRL (DRAPES) ×3 IMPLANT
DRSG ADAPTIC 3X8 NADH LF (GAUZE/BANDAGES/DRESSINGS) ×3 IMPLANT
DRSG PAD ABDOMINAL 8X10 ST (GAUZE/BANDAGES/DRESSINGS) ×3 IMPLANT
DURAPREP 26ML APPLICATOR (WOUND CARE) ×3 IMPLANT
ELECT REM PT RETURN 15FT ADLT (MISCELLANEOUS) ×3 IMPLANT
EVACUATOR 1/8 PVC DRAIN (DRAIN) ×3 IMPLANT
GAUZE SPONGE 4X4 12PLY STRL (GAUZE/BANDAGES/DRESSINGS) ×3 IMPLANT
GLOVE BIO SURGEON STRL SZ7.5 (GLOVE) ×2 IMPLANT
GLOVE BIO SURGEON STRL SZ8 (GLOVE) ×3 IMPLANT
GLOVE BIOGEL PI IND STRL 6.5 (GLOVE) IMPLANT
GLOVE BIOGEL PI IND STRL 8 (GLOVE) ×1 IMPLANT
GLOVE BIOGEL PI INDICATOR 6.5 (GLOVE)
GLOVE BIOGEL PI INDICATOR 8 (GLOVE) ×4
GLOVE SURG SS PI 6.5 STRL IVOR (GLOVE) IMPLANT
GOWN STRL REUS W/TWL LRG LVL3 (GOWN DISPOSABLE) ×3 IMPLANT
GOWN STRL REUS W/TWL XL LVL3 (GOWN DISPOSABLE) ×2 IMPLANT
HANDPIECE INTERPULSE COAX TIP (DISPOSABLE) ×3
IMMOBILIZER KNEE 20 (SOFTGOODS) ×3
IMMOBILIZER KNEE 20 THIGH 36 (SOFTGOODS) ×1 IMPLANT
MANIFOLD NEPTUNE II (INSTRUMENTS) ×3 IMPLANT
NS IRRIG 1000ML POUR BTL (IV SOLUTION) ×3 IMPLANT
PACK TOTAL KNEE CUSTOM (KITS) ×3 IMPLANT
PADDING CAST COTTON 6X4 STRL (CAST SUPPLIES) ×7 IMPLANT
POSITIONER SURGICAL ARM (MISCELLANEOUS) ×3 IMPLANT
SET HNDPC FAN SPRY TIP SCT (DISPOSABLE) ×1 IMPLANT
STRIP CLOSURE SKIN 1/2X4 (GAUZE/BANDAGES/DRESSINGS) ×4 IMPLANT
SUT MNCRL AB 4-0 PS2 18 (SUTURE) ×3 IMPLANT
SUT STRATAFIX 0 PDS 27 VIOLET (SUTURE) ×3
SUT VIC AB 2-0 CT1 27 (SUTURE) ×12
SUT VIC AB 2-0 CT1 TAPERPNT 27 (SUTURE) ×3 IMPLANT
SUTURE STRATFX 0 PDS 27 VIOLET (SUTURE) ×1 IMPLANT
SYR 30ML LL (SYRINGE) ×6 IMPLANT
TRAY FOLEY CATH 14FRSI W/METER (CATHETERS) ×2 IMPLANT
TRAY FOLEY W/METER SILVER 16FR (SET/KITS/TRAYS/PACK) ×1 IMPLANT
WATER STERILE IRR 1000ML POUR (IV SOLUTION) ×6 IMPLANT
WRAP KNEE MAXI GEL POST OP (GAUZE/BANDAGES/DRESSINGS) ×3 IMPLANT
YANKAUER SUCT BULB TIP 10FT TU (MISCELLANEOUS) ×3 IMPLANT

## 2017-01-26 NOTE — Anesthesia Procedure Notes (Signed)
Procedure Name: MAC Date/Time: 01/26/2017 10:57 AM Performed by: Dione Booze Pre-anesthesia Checklist: Emergency Drugs available, Suction available, Patient being monitored and Patient identified Patient Re-evaluated:Patient Re-evaluated prior to inductionOxygen Delivery Method: Simple face mask Placement Confirmation: positive ETCO2

## 2017-01-26 NOTE — Anesthesia Procedure Notes (Signed)
Spinal  Patient location during procedure: OR Start time: 01/26/2017 10:56 AM End time: 01/26/2017 11:01 AM Staffing Anesthesiologist: Anitra LauthMILLER,  RAY Performed: anesthesiologist  Preanesthetic Checklist Completed: patient identified, site marked, surgical consent, pre-op evaluation, timeout performed, IV checked, risks and benefits discussed and monitors and equipment checked Spinal Block Patient position: sitting Prep: DuraPrep Patient monitoring: heart rate, cardiac monitor, continuous pulse ox and blood pressure Approach: midline Location: L3-4 Injection technique: single-shot Needle Needle type: Quincke  Needle gauge: 22 G Needle length: 9 cm

## 2017-01-26 NOTE — Anesthesia Postprocedure Evaluation (Signed)
Anesthesia Post Note  Patient: [name] [name]  Procedure(s) Performed: Procedure(s) (LRB): LEFT TOTAL KNEE ARTHROPLASTY (Left)     Patient location during evaluation: PACU Anesthesia Type: Regional and Spinal Level of consciousness: oriented and awake and alert Pain management: pain level controlled Vital Signs Assessment: post-procedure vital signs reviewed and stable Respiratory status: spontaneous breathing, respiratory function stable and patient connected to nasal cannula oxygen Cardiovascular status: blood pressure returned to baseline and stable Postop Assessment: no headache and no backache Anesthetic complications: no    Last Vitals:  Vitals:   01/26/17 1230 01/26/17 1245  BP: (!) 134/114 128/80  Pulse: (!) 58 (!) 48  Resp: 12 20  Temp: 36.4 C     Last Pain:  Vitals:   01/26/17 1245  TempSrc:   PainSc: Asleep                 ,

## 2017-01-26 NOTE — Progress Notes (Signed)
Assisted Dr. Miller with left, ultrasound guided, adductor canal block. Side rails up, monitors on throughout procedure. See vital signs in flow sheet. Tolerated Procedure well.  

## 2017-01-26 NOTE — Anesthesia Preprocedure Evaluation (Signed)
Anesthesia Evaluation  Patient identified by MRN, date of birth, ID band Patient awake    Reviewed: Allergy & Precautions, NPO status , Patient's Chart, lab work & pertinent test results  Airway Mallampati: II  TM Distance: >3 FB Neck ROM: Full    Dental no notable dental hx.    Pulmonary neg pulmonary ROS,    Pulmonary exam normal breath sounds clear to auscultation       Cardiovascular negative cardio ROS Normal cardiovascular exam Rhythm:Regular Rate:Normal     Neuro/Psych  Headaches, negative neurological ROS  negative psych ROS   GI/Hepatic negative GI ROS, Neg liver ROS, GERD  ,  Endo/Other  negative endocrine ROS  Renal/GU negative Renal ROS  negative genitourinary   Musculoskeletal negative musculoskeletal ROS (+) Arthritis ,   Abdominal   Peds negative pediatric ROS (+)  Hematology negative hematology ROS (+)   Anesthesia Other Findings   Reproductive/Obstetrics negative OB ROS                             Anesthesia Physical Anesthesia Plan  ASA: II  Anesthesia Plan: MAC, Regional and Spinal   Post-op Pain Management:  Regional for Post-op pain   Induction: Intravenous  PONV Risk Score and Plan: 2 and Ondansetron and Propofol  Airway Management Planned: Simple Face Mask  Additional Equipment:   Intra-op Plan:   Post-operative Plan: Extubation in OR  Informed Consent: I have reviewed the patients History and Physical, chart, labs and discussed the procedure including the risks, benefits and alternatives for the proposed anesthesia with the patient or authorized representative who has indicated his/her understanding and acceptance.     Plan Discussed with: CRNA  Anesthesia Plan Comments:         Anesthesia Quick Evaluation

## 2017-01-26 NOTE — Anesthesia Procedure Notes (Signed)
Anesthesia Regional Block: Adductor canal block   Pre-Anesthetic Checklist: ,, timeout performed, Correct Patient, Correct Site, Correct Laterality, Correct Procedure, Correct Position, site marked, Risks and benefits discussed,  Surgical consent,  Pre-op evaluation,  At surgeon's request and post-op pain management  Laterality: Left  Prep: Dura Prep       Needles:  Injection technique: Single-shot  Needle Type: Stimiplex     Needle Length: 9cm  Needle Gauge: 21     Additional Needles:   Procedures: ultrasound guided,,,,,,,,  Narrative:  Start time: 01/26/2017 9:21 AM End time: 01/26/2017 9:26 AM Injection made incrementally with aspirations every 5 mL.  Performed by: Personally  Anesthesiologist: Anitra LauthMILLER,  RAY

## 2017-01-26 NOTE — Anesthesia Postprocedure Evaluation (Signed)
Anesthesia Post Note  Patient: [name] [name]  Procedure(s) Performed: Procedure(s) (LRB): LEFT TOTAL KNEE ARTHROPLASTY (Left)     Anesthesia Post Evaluation  Last Vitals:  Vitals:   01/26/17 1230 01/26/17 1245  BP: (!) 134/114 128/80  Pulse: (!) 58 (!) 48  Resp: 12 20  Temp: 36.4 C     Last Pain:  Vitals:   01/26/17 1245  TempSrc:   PainSc: Asleep                 ,

## 2017-01-26 NOTE — Interval H&P Note (Signed)
History and Physical Interval Note:  01/26/2017 8:38 AM  [name] [name]  has presented today for surgery, with the diagnosis of Osteoarthritis Left knee  The various methods of treatment have been discussed with the patient and family. After consideration of risks, benefits and other options for treatment, the patient has consented to  Procedure(s): LEFT TOTAL KNEE ARTHROPLASTY (Left) as a surgical intervention .  The patient's history has been reviewed, patient examined, no change in status, stable for surgery.  I have reviewed the patient's chart and labs.  Questions were answered to the patient's satisfaction.     Gearlean Alf

## 2017-01-26 NOTE — H&P (View-Only) (Signed)
[name] [name] DOB: 02/11/1961 Divorced / Language: English / Race: White Female Date of Admission:  01/26/2017 CC:  Left Knee Pain History of Present Illness  The patient is a 55 year old female who comes in for a preoperative History and Physical. The patient is scheduled for a left total knee arthroplasty to be performed by Dr. Frank V. Aluisio, MD at [facility] on 01-26-2017. The patient is a 55 year old female who reported left knee greater than right knee symptoms including: pain, swelling, catching, giving way, stiffness and popping which began year(s) ago without any known injury. The patient describes the severity of the symptoms as severe.The patient feels that the symptoms are worsening. Past treatment for this problem has included knee brace. Onset of symptoms was with symptoms now occurring constantly. She saw Dr Swinteck in January and was not satisfied with that visit. She has been having problems with her left knee for many years now. It is at the stage where it is hurting at all times. It is even keeping her up at night. It is limiting what she can and cannot do. 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 She is no longer working. She says she stays home most of the day. Even despite the inactivity, her knees are very problematic for her. The right hurts also, but not to the same degree as the left one does. She is not having associated hip pain. She does not get swelling in the knees. They occasionally feel like they want to give out but not routinely. She has not had locking episodes. AP and lateral of both knees show severe end-stage medial compartment with bone-on-bone arthritis in both knees, left worse than the right. She also has patellofemoral bone on bone on the left worse than right. She has advanced end-stage arthritic changes in the left worse than right knee with symptoms worse on the left than the  right. She has had cortisone and viscosupplements without benefit. At this point, the most predictable means of improving her pain and function will be total knee arthroplasty. She is ready to proceed at this time. They have been treated conservatively in the past for the above stated problem and despite conservative measures, they continue to have progressive pain and severe functional limitations and dysfunction. They have failed non-operative management including home exercise, medications, and injections. It is felt that they would benefit from undergoing total joint replacement. Risks and benefits of the procedure have been discussed with the patient and they elect to proceed with surgery. There are no active contraindications to surgery such as ongoing infection or rapidly progressive neurological disease.  Problem List/Past Medical Primary localized osteoarthritis of left knee (M17.12)  Migraine Headache  Kidney Stone   Allergies  Penicillin V *PENICILLINS*   Family History Chronic Obstructive Lung Disease  Maternal Grandmother, Mother. Congestive Heart Failure  Paternal Grandfather. Diabetes Mellitus  Father, Maternal Grandmother, Mother. Drug / Alcohol Addiction  Maternal Grandfather. Hypertension  Father, Mother, Sister. Liver Disease, Chronic  Maternal Grandfather. Osteoarthritis  Maternal Grandmother, Mother, Paternal Grandmother, Sister. Rheumatoid Arthritis  Paternal Grandmother.  Social History  Children  1 Current drinker  08/14/2016: Currently drinks beer and wine only occasionally per week Current work status  disabled Exercise  Exercises rarely; does other Living situation  live with parents Marital status  divorced No history of drug/alcohol rehab  Number of flights of stairs   before winded  2-3 Tobacco / smoke exposure  08/14/2016: yes outdoors only Tobacco use  Never smoker. 08/14/2016  Medication History Gabapentin (300MG Tablet, Oral  every evening) Active. Hydrocodone-Acetaminophen (10-325MG Tablet, Oral every 4 to 6 hours as needed for pain) Active. (from her PCP) Furosemide (40MG Tablet, Oral as needed for swelling) Active. Naproxen (500MG Tablet, Oral twice a day) Active.   Past Surgical History Umbilical Hernia Repair  Date: 1979. Tubaligation  Date: 1992.   Review of Systems General Present- Night Sweats. Not Present- Chills, Fatigue, Fever, Memory Loss, Weight Gain and Weight Loss. Skin Not Present- Eczema, Hives, Itching, Lesions and Rash. HEENT Not Present- Dentures, Double Vision, Headache, Hearing Loss, Tinnitus and Visual Loss. Respiratory Not Present- Allergies, Chronic Cough, Coughing up blood, Shortness of breath at rest and Shortness of breath with exertion. Cardiovascular Not Present- Chest Pain, Difficulty Breathing Lying Down, Murmur, Palpitations, Racing/skipping heartbeats and Swelling. Gastrointestinal Present- Heartburn. Not Present- Abdominal Pain, Bloody Stool, Constipation, Diarrhea, Difficulty Swallowing, Jaundice, Loss of appetitie, Nausea and Vomiting. Female Genitourinary Not Present- Blood in Urine, Discharge, Flank Pain, Incontinence, Painful Urination, Urgency, Urinary frequency, Urinary Retention, Urinating at Night and Weak urinary stream. Musculoskeletal Present- Morning Stiffness. Not Present- Back Pain, Joint Pain, Joint Swelling, Muscle Pain, Muscle Weakness and Spasms. Neurological Not Present- Blackout spells, Difficulty with balance, Dizziness, Paralysis, Tremor and Weakness. Psychiatric Not Present- Insomnia.  Vitals Weight: 171 lb Height: 65in Body Surface Area: 1.85 m Body Mass Index: 28.46 kg/m  Pulse: 76 (Regular)  BP: 142/84 (Sitting, Right Arm, Standard)    Physical Exam  General Mental Status -Alert, cooperative and good historian. General Appearance-pleasant, Not in acute distress. Orientation-Oriented X3. Build & Nutrition-Well  nourished and Well developed.  Head and Neck Head-normocephalic, atraumatic . Neck Global Assessment - supple, no bruit auscultated on the right, no bruit auscultated on the left.  Eye Vision-Wears corrective lenses(mainly readers). Pupil - Bilateral-Regular and Round. Motion - Bilateral-EOMI.  Chest and Lung Exam Auscultation Breath sounds - clear at anterior chest wall and clear at posterior chest wall. Adventitious sounds - No Adventitious sounds.  Cardiovascular Auscultation Rhythm - Regular rate and rhythm. Heart Sounds - S1 WNL and S2 WNL. Murmurs & Other Heart Sounds - Auscultation of the heart reveals - No Murmurs.  Abdomen Palpation/Percussion Tenderness - Abdomen is non-tender to palpation. Rigidity (guarding) - Abdomen is soft. Auscultation Auscultation of the abdomen reveals - Bowel sounds normal.  Female Genitourinary Note: Not done, not pertinent to present illness   Musculoskeletal Note: A well-developed female in no distress. Both hips have normal range of motion with no discomfort. The left knee, no effusion. Range about 5 to 120. Marked crepitus on range of motion. Tenderness medial greater than lateral with no instability. Right knee, no effusion. Range about 5 to 125. Marked crepitus on range of motion. Tender medial greater than lateral with no instability. Pulse, sensation and motor are intact distally. She has a significantly antalgic gait pattern.  RADIOGRAPHS AP and lateral of both knees show severe end-stage medial compartment with bone-on-bone arthritis in both knees, left worse than the right. She also has patellofemoral bone on bone on the left worse than right.  Assessment & Plan Primary localized osteoarthritis of left knee (M17.12) Primary localized osteoarthritis of right knee (M17.11)  Note:Surgical Plans: Left Total Knee Replacement  Disposition: Home vs. Rehab  PCP: Dr. Cynthia Butler - Patient has been seen preoperatively  and felt to be stable for surgery.  IV TXA  Anesthesia Issues:   None  Patient was instructed on what medications to stop prior to surgery.  Signed electronically by  L , III PA-C  

## 2017-01-26 NOTE — Interval H&P Note (Signed)
History and Physical Interval Note:  01/26/2017 8:28 AM  [name] [name]  has presented today for surgery, with the diagnosis of Osteoarthritis Left knee  The various methods of treatment have been discussed with the patient and family. After consideration of risks, benefits and other options for treatment, the patient has consented to  Procedure(s): LEFT TOTAL KNEE ARTHROPLASTY (Left) as a surgical intervention .  The patient's history has been reviewed, patient examined, no change in status, stable for surgery.  I have reviewed the patient's chart and labs.  Questions were answered to the patient's satisfaction.     Gearlean Alf

## 2017-01-26 NOTE — Transfer of Care (Signed)
Immediate Anesthesia Transfer of Care Note  Patient: [name] [name]  Procedure(s) Performed: Procedure(s) with comments: LEFT TOTAL KNEE ARTHROPLASTY (Left) - with block  Patient Location: PACU  Anesthesia Type:MAC, Regional and Spinal  Level of Consciousness: awake, alert , oriented and patient cooperative  Airway & Oxygen Therapy: Patient Spontanous Breathing and Patient connected to face mask oxygen  Post-op Assessment: Report given to RN and Post -op Vital signs reviewed and stable  Post vital signs: Reviewed and stable  Last Vitals:  Vitals:   01/26/17 0926 01/26/17 0932  BP: (!) 145/69 133/82  Pulse: 60 66  Resp: 12 (!) 21  Temp:      Last Pain:  Vitals:   01/26/17 0926  TempSrc:   PainSc: Asleep         Complications: No apparent anesthesia complications

## 2017-01-26 NOTE — Op Note (Signed)
OPERATIVE REPORT-TOTAL KNEE ARTHROPLASTY   Pre-operative diagnosis- Osteoarthritis  Left knee(s)  Post-operative diagnosis- Osteoarthritis Left knee(s)  Procedure-  Left  Total Knee Arthroplasty  Surgeon- Dione Plover. , MD  Assistant- Arlee Muslim, PA-C   Anesthesia-  Adductor canal block and spinal  EBL-* No blood loss amount entered *   Drains Hemovac  Tourniquet time-  Total Tourniquet Time Documented: Thigh (laterality) - 29 minutes Total: Thigh (laterality) - 29 minutes     Complications- None  Condition-PACU - hemodynamically stable.   Brief Clinical Note  [name] [name] is a 56 y.o. year old female with end stage OA of her left knee with progressively worsening pain and dysfunction. She has constant pain, with activity and at rest and significant functional deficits with difficulties even with ADLs. She has had extensive non-op management including analgesics, injections of cortisone and viscosupplements, and home exercise program, but remains in significant pain with significant dysfunction. Radiographs show bone on bone arthritis medial and patellofemoral. She presents now for left Total Knee Arthroplasty.    Procedure in detail---   The patient is brought into the operating room and positioned supine on the operating table. After successful administration of  Adductor canal block and spinal,   a tourniquet is placed high on the  Left thigh(s) and the lower extremity is prepped and draped in the usual sterile fashion. Time out is performed by the operating team and then the  Left lower extremity is wrapped in Esmarch, knee flexed and the tourniquet inflated to 300 mmHg.       A midline incision is made with a ten blade through the subcutaneous tissue to the level of the extensor mechanism. A fresh blade is used to make a medial parapatellar arthrotomy. Soft tissue over the proximal medial tibia is subperiosteally elevated to the joint line with a knife and into the  semimembranosus bursa with a Cobb elevator. Soft tissue over the proximal lateral tibia is elevated with attention being paid to avoiding the patellar tendon on the tibial tubercle. The patella is everted, knee flexed 90 degrees and the ACL and PCL are removed. Findings are bone on bone medial and patellofemoral with large global osteophytes.        The drill is used to create a starting hole in the distal femur and the canal is thoroughly irrigated with sterile saline to remove the fatty contents. The 5 degree Left  valgus alignment guide is placed into the femoral canal and the distal femoral cutting block is pinned to remove 9 mm off the distal femur. Resection is made with an oscillating saw.      The tibia is subluxed forward and the menisci are removed. The extramedullary alignment guide is placed referencing proximally at the medial aspect of the tibial tubercle and distally along the second metatarsal axis and tibial crest. The block is pinned to remove 49m off the more deficient medial  side. Resection is made with an oscillating saw. Size 4is the most appropriate size for the tibia and the proximal tibia is prepared with the modular drill and keel punch for that size.      The femoral sizing guide is placed and size 4 is most appropriate. Rotation is marked off the epicondylar axis and confirmed by creating a rectangular flexion gap at 90 degrees. The size 4 cutting block is pinned in this rotation and the anterior, posterior and chamfer cuts are made with the oscillating saw. The intercondylar block is then placed and  that cut is made.      Trial size 4 tibial component, trial size 4 narrow posterior stabilized femur and a 10  mm posterior stabilized rotating platform insert trial is placed. Full extension is achieved with excellent varus/valgus and anterior/posterior balance throughout full range of motion. The patella is everted and thickness measured to be 22  mm. Free hand resection is taken to 12  mm, a 35 template is placed, lug holes are drilled, trial patella is placed, and it tracks normally. Osteophytes are removed off the posterior femur with the trial in place. All trials are removed and the cut bone surfaces prepared with pulsatile lavage. Cement is mixed and once ready for implantation, the size 4 tibial implant, size  4 narrow posterior stabilized femoral component, and the size 35 patella are cemented in place and the patella is held with the clamp. The trial insert is placed and the knee held in full extension. The Exparel (20 ml mixed with 60 ml saline) is injected into the extensor mechanism, posterior capsule, medial and lateral gutters and subcutaneous tissues.  All extruded cement is removed and once the cement is hard the permanent 10 mm posterior stabilized rotating platform insert is placed into the tibial tray.      The wound is copiously irrigated with saline solution and the extensor mechanism closed over a hemovac drain with #1 V-loc suture. The tourniquet is released for a total tourniquet time of 29  minutes. Flexion against gravity is 135 degrees and the patella tracks normally. Subcutaneous tissue is closed with 2.0 vicryl and subcuticular with running 4.0 Monocryl. The incision is cleaned and dried and steri-strips and a bulky sterile dressing are applied. The limb is placed into a knee immobilizer and the patient is awakened and transported to recovery in stable condition.      Please note that a surgical assistant was a medical necessity for this procedure in order to perform it in a safe and expeditious manner. Surgical assistant was necessary to retract the ligaments and vital neurovascular structures to prevent injury to them and also necessary for proper positioning of the limb to allow for anatomic placement of the prosthesis.   Dione Plover , MD    01/26/2017, 11:48 AM

## 2017-01-26 NOTE — Progress Notes (Signed)
Orthopedic Tech Progress Note Patient Details:  [name] [name] 20-Apr-1961 [id number]  OHF w/ Trapeze. CPM Left Knee CPM Left Knee: On Left Knee Flexion (Degrees): 40 Left Knee Extension (Degrees): 10 Additional Comments: Increase 10 degrees/day, 4 hours/day   Clois Dupesvery S  01/26/2017, 2:56 PM

## 2017-01-27 LAB — BASIC METABOLIC PANEL
Anion gap: 6 (ref 5–15)
BUN: 9 mg/dL (ref 6–20)
CALCIUM: 8.9 mg/dL (ref 8.9–10.3)
CO2: 25 mmol/L (ref 22–32)
CREATININE: 0.65 mg/dL (ref 0.44–1.00)
Chloride: 108 mmol/L (ref 101–111)
GFR calc non Af Amer: >60 mL/min (ref >60)
Glucose, Bld: 166 mg/dL — ABNORMAL HIGH (ref 65–99)
Potassium: 4.4 mmol/L (ref 3.5–5.1)
SODIUM: 139 mmol/L (ref 135–145)

## 2017-01-27 LAB — CBC
HCT: 31 % — ABNORMAL LOW (ref 36.0–46.0)
Hemoglobin: 9.5 g/dL — ABNORMAL LOW (ref 12.0–15.0)
MCH: 25.5 pg — ABNORMAL LOW (ref 26.0–34.0)
MCHC: 30.6 g/dL (ref 30.0–36.0)
MCV: 83.3 fL (ref 78.0–100.0)
Platelets: 202 10*3/uL (ref 150–400)
RBC: 3.72 MIL/uL — ABNORMAL LOW (ref 3.87–5.11)
RDW: 16.9 % — AB (ref 11.5–15.5)
WBC: 11.6 10*3/uL — ABNORMAL HIGH (ref 4.0–10.5)

## 2017-01-27 MED ORDER — GABAPENTIN 300 MG PO CAPS: 300.0000 mg | ORAL_CAPSULE | Freq: Three times a day (TID) | ORAL | 0 refills | Status: DC

## 2017-01-27 MED ORDER — OXYCODONE HCL 5 MG PO TABS
10.0000 mg | ORAL_TABLET | ORAL | Status: DC | PRN
  Administered 2017-01-27 – 2017-01-28 (×7): 15 mg via ORAL
  Filled 2017-01-27 (×4): qty 3
  Filled 2017-01-27: qty 2
  Filled 2017-01-27 (×3): qty 3

## 2017-01-27 MED ORDER — GABAPENTIN 300 MG PO CAPS
300.0000 mg | ORAL_CAPSULE | Freq: Three times a day (TID) | ORAL | Status: DC
Start: 2017-01-27 — End: 2017-01-28
  Administered 2017-01-27 – 2017-01-28 (×2): 300 mg via ORAL
  Filled 2017-01-27 (×2): qty 1

## 2017-01-27 MED ORDER — SODIUM CHLORIDE 0.9 % IV BOLUS (SEPSIS)
250.0000 mL | Freq: Once | INTRAVENOUS | Status: AC
  Administered 2017-01-27: 09:00:00 250 mL via INTRAVENOUS

## 2017-01-27 MED ORDER — RIVAROXABAN 10 MG PO TABS: 10.0000 mg | ORAL_TABLET | Freq: Every day | ORAL | 0 refills | Status: DC

## 2017-01-27 MED ORDER — TRAMADOL HCL 50 MG PO TABS: 50.0000 mg | ORAL_TABLET | Freq: Four times a day (QID) | ORAL | 0 refills | Status: DC | PRN

## 2017-01-27 MED ORDER — METHOCARBAMOL 500 MG PO TABS: 500.0000 mg | ORAL_TABLET | Freq: Four times a day (QID) | ORAL | 0 refills | Status: DC | PRN

## 2017-01-27 MED ORDER — NON FORMULARY: 20.0000 mg | Freq: Every day | Status: DC

## 2017-01-27 MED ORDER — OXYCODONE HCL 5 MG PO TABS: 5.0000 mg | ORAL_TABLET | ORAL | 0 refills | Status: DC | PRN

## 2017-01-27 MED ORDER — ESOMEPRAZOLE MAGNESIUM 20 MG PO CPDR
20.0000 mg | DELAYED_RELEASE_CAPSULE | Freq: Every day | ORAL | Status: DC
  Administered 2017-01-28: 09:00:00 20 mg via ORAL
  Filled 2017-01-27 (×2): qty 1

## 2017-01-27 NOTE — Progress Notes (Signed)
   01/27/17 1400  PT Visit Information  Last PT Received On 01/27/17  Assistance Needed +1  History of Present Illness s/p L TKA  Subjective Data  Subjective agreeable to ex's only  Patient Stated Goal home  Precautions  Precautions Knee;Fall  Required Braces or Orthoses Knee Immobilizer - Left  Knee Immobilizer - Left Discontinue once straight leg raise with < 10 degree lag  Restrictions  Other Position/Activity Restrictions WBAT  Pain Assessment  Pain Assessment 0-10  Pain Score 10  Pain Location L knee  Pain Descriptors / Indicators Aching  Pain Intervention(s) Limited activity within patient's tolerance;Monitored during session;Premedicated before session;Ice applied  Total Joint Exercises  Ankle Circles/Pumps AROM;Both;10 reps  Quad Sets 10 reps;Both;AROM  Heel Slides AAROM;Left;10 reps  Hip ABduction/ADduction AAROM;Left;10 reps  Straight Leg Raises AAROM;Left;10 reps  Goniometric ROM ~ 8* to 40*--limited by pain and mm guarding  PT - End of Session  Activity Tolerance Patient tolerated treatment well;No increased pain  Patient left in bed;with call bell/phone within reach;with bed alarm set  PT - Assessment/Plan  PT Plan Current plan remains appropriate  PT Visit Diagnosis Difficulty in walking, not elsewhere classified (R26.2);Pain  Pain - Right/Left Left  Pain - part of body Knee  PT Frequency (ACUTE ONLY) 7X/week  Follow Up Recommendations DC plan and follow up therapy as arranged by surgeon  PT equipment None recommended by PT  AM-PAC PT "6 Clicks" Daily Activity Outcome Measure  Difficulty turning over in bed (including adjusting bedclothes, sheets and blankets)? 1  Difficulty moving from lying on back to sitting on the side of the bed?  1  Difficulty sitting down on and standing up from a chair with arms (e.g., wheelchair, bedside commode, etc,.)? 1  Help needed moving to and from a bed to chair (including a wheelchair)? 3  Help needed walking in hospital room?  3  Help needed climbing 3-5 steps with a railing?  3  6 Click Score 12  Mobility G Code  CL  PT Goal Progression  Progress towards PT goals Progressing toward goals  Acute Rehab PT Goals  PT Goal Formulation With patient  Time For Goal Achievement 01/27/17  Potential to Achieve Goals Good  PT Time Calculation  PT Start Time (ACUTE ONLY) 1410  PT Stop Time (ACUTE ONLY) 1420  PT Time Calculation (min) (ACUTE ONLY) 10 min  PT General Charges  $$ ACUTE PT VISIT 1 Procedure  PT Treatments  $Therapeutic Exercise 8-22 mins

## 2017-01-27 NOTE — Progress Notes (Signed)
   Subjective: 1 Day Post-Op Procedure(s) (LRB): LEFT TOTAL KNEE ARTHROPLASTY (Left) Patient reported no pain on rounds this morning Patient seen in rounds with Dr. Lequita HaltAluisio. Patient was well, and has had no acute complaints or problems Started therapy today. Walked 65 feet this morning. Plan is to go Home after hospital stay.  Objective: Vital signs in last 24 hours: Temp:  [97.5 F (36.4 C)-98.9 F (37.2 C)] 98.7 F (37.1 C) (06/26 2054) Pulse Rate:  [75-87] 87 (06/26 2054) Resp:  [15-16] 16 (06/26 2054) BP: (92-159)/(77-86) 144/86 (06/26 2054) SpO2:  [98 %-100 %] 100 % (06/26 2054)  Intake/Output from previous day:  Intake/Output Summary (Last 24 hours) at 01/27/17 2138 Last data filed at 01/27/17 2055  Gross per 24 hour  Intake          3248.92 ml  Output             3450 ml  Net          -201.08 ml    Intake/Output this shift: Total I/O In: 480 [P.O.:480] Out: -   Labs:  Recent Labs  01/27/17 0406  HGB 9.5*    Recent Labs  01/27/17 0406  WBC 11.6*  RBC 3.72*  HCT 31.0*  PLT 202    Recent Labs  01/27/17 0406  NA 139  K 4.4  CL 108  CO2 25  BUN 9  CREATININE 0.65  GLUCOSE 166*  CALCIUM 8.9   No results for input(s): LABPT, INR in the last 72 hours.  EXAM General - Patient is Alert, Appropriate and Oriented Extremity - Neurovascular intact Sensation intact distally Intact pulses distally Dorsiflexion/Plantar flexion intact Dressing - dressing C/D/I Motor Function - intact, moving foot and toes well on exam.  Hemovac pulled without difficulty.  Past Medical History:  Diagnosis Date  . Arthritis   . GERD (gastroesophageal reflux disease)   . History of kidney stones   . Kidney stones   . Migraine   . Restless leg syndrome   . Swelling of lower limb    bialteral lower leg swelling    Assessment/Plan: 1 Day Post-Op Procedure(s) (LRB): LEFT TOTAL KNEE ARTHROPLASTY (Left) Principal Problem:   OA (osteoarthritis) of  knee  Estimated body mass index is 29.95 kg/m as calculated from the following:   Height as of this encounter: 5\' 5"  (1.651 m).   Weight as of this encounter: 81.6 kg (180 lb). Advance diet Up with therapy Plan for discharge tomorrow Discharge home with home health  DVT Prophylaxis - Xarelto Weight-Bearing as tolerated to left leg D/C O2 and Pulse OX and try on Room Air Changed Neurontin around and will place on TID for three weeks and then resume the previous home dosing.  Avel Peacerew , PA-C Orthopaedic Surgery 01/27/2017, 9:38 PM

## 2017-01-27 NOTE — Evaluation (Signed)
Occupational Therapy Evaluation Patient Details Name: [name] [name] MRN: [id number] DOB: 1961-03-15 Today's Date: 01/27/2017    History of Present Illness s/p L TKA   Clinical Impression   This 56 year old female was admitted for the above sx. She will benefit from continued OT in acute to increase safety with adls/bathroom transfers.  Pt currently needs min guard to min A. Goals in acute are for supervision level    Follow Up Recommendations  Supervision/Assistance - 24 hour (pt declines SNF)    Equipment Recommendations  3 in 1 bedside commode    Recommendations for Other Services       Precautions / Restrictions Precautions Precautions: Knee;Fall Required Braces or Orthoses: Knee Immobilizer - Left Restrictions Other Position/Activity Restrictions: WBAT      Mobility Bed Mobility               General bed mobility comments: oob by NT  Transfers Overall transfer level: Needs assistance Equipment used: Rolling walker (2 wheeled) Transfers: Sit to/from Stand Sit to Stand: Min guard         General transfer comment: cues for UE/LE placement    Balance                                           ADL either performed or assessed with clinical judgement   ADL Overall ADL's : Needs assistance/impaired     Grooming: Oral care;Supervision/safety;Standing   Upper Body Bathing: Set up;Sitting   Lower Body Bathing: Minimal assistance;Sit to/from stand   Upper Body Dressing : Set up;Sitting   Lower Body Dressing: Minimal assistance;Sit to/from stand   Toilet Transfer: Minimal assistance;Ambulation;RW;BSC   Toileting- ArchitectClothing Manipulation and Hygiene: Min guard;Sit to/from stand         General ADL Comments: pt will benefit from a 3:1 commode.  She has a high tub and said the doctor said she will need to sponge bathe for weeks:  this will be safer for her     Vision         Perception     Praxis      Pertinent  Vitals/Pain Pain Assessment: Faces Faces Pain Scale: Hurts little more Pain Location: L knee Pain Descriptors / Indicators: Aching Pain Intervention(s): Limited activity within patient's tolerance;Monitored during session;Premedicated before session;Repositioned;Ice applied     Hand Dominance     Extremity/Trunk Assessment Upper Extremity Assessment Upper Extremity Assessment: Overall WFL for tasks assessed           Communication Communication Communication: No difficulties (very talkative)   Cognition Arousal/Alertness: Awake/alert Behavior During Therapy: WFL for tasks assessed/performed Overall Cognitive Status:  (mostly WFL; cues for safety)                                 General Comments: pt is very talkative; cues for safety   General Comments       Exercises     Shoulder Instructions      Home Living Family/patient expects to be discharged to:: Private residence Living Arrangements: Alone Available Help at Discharge: Family (daughter x 1 week)               Bathroom Shower/Tub: Chief Strategy OfficerTub/shower unit   Bathroom Toilet: Standard     Home Equipment: Environmental consultantWalker - 2 wheels;Crutches;Cane - single point  Additional Comments: high tub; Dr Despina Hick told pt not to shower for 6-8 weeks      Prior Functioning/Environment Level of Independence: Independent        Comments: very active        OT Problem List: Decreased knowledge of use of DME or AE;Pain;Decreased safety awareness;Decreased strength      OT Treatment/Interventions: Self-care/ADL training;DME and/or AE instruction;Patient/family education;Therapeutic activities    OT Goals(Current goals can be found in the care plan section) Acute Rehab OT Goals Patient Stated Goal: home OT Goal Formulation: With patient Time For Goal Achievement: 02/03/17 Potential to Achieve Goals: Good ADL Goals Pt Will Transfer to Toilet: with supervision;bedside commode;ambulating Additional ADL Goal #1:  pt will complete adl with supervision to retrieve clothes and AE as needed   OT Frequency: Min 2X/week   Barriers to D/C:            Co-evaluation              AM-PAC PT "6 Clicks" Daily Activity     Outcome Measure Help from another person eating meals?: None Help from another person taking care of personal grooming?: A Little Help from another person toileting, which includes using toliet, bedpan, or urinal?: A Little Help from another person bathing (including washing, rinsing, drying)?: A Little Help from another person to put on and taking off regular upper body clothing?: A Little Help from another person to put on and taking off regular lower body clothing?: A Little 6 Click Score: 19   End of Session    Activity Tolerance: Patient tolerated treatment well Patient left: in chair;with call bell/phone within reach;with chair alarm set  OT Visit Diagnosis: Muscle weakness (generalized) (M62.81);Pain Pain - Right/Left: Left Pain - part of body: Knee                Time: 0981-1914 OT Time Calculation (min): 18 min Charges:  OT General Charges $OT Visit: 1 Procedure OT Evaluation $OT Eval Low Complexity: 1 Procedure G-Codes:     [place], OTR/L [phone] 01/27/2017  , 01/27/2017, 9:51 AM

## 2017-01-27 NOTE — Discharge Instructions (Signed)
Dr. Gaynelle Arabian Total Joint Specialist [facility] [address], [place], [place] [address] [phone]  TOTAL KNEE REPLACEMENT POSTOPERATIVE DIRECTIONS  Knee Rehabilitation, Guidelines Following Surgery  Results after knee surgery are often greatly improved when you follow the exercise, range of motion and muscle strengthening exercises prescribed by your doctor. Safety measures are also important to protect the knee from further injury. Any time any of these exercises cause you to have increased pain or swelling in your knee joint, decrease the amount until you are comfortable again and slowly increase them. If you have problems or questions, call your caregiver or physical therapist for advice.   HOME CARE INSTRUCTIONS  Remove items at home which could result in a fall. This includes throw rugs or furniture in walking pathways.   ICE to the affected knee every three hours for 30 minutes at a time and then as needed for pain and swelling.  Continue to use ice on the knee for pain and swelling from surgery. You may notice swelling that will progress down to the foot and ankle.  This is normal after surgery.  Elevate the leg when you are not up walking on it.    Continue to use the breathing machine which will help keep your temperature down.  It is common for your temperature to cycle up and down following surgery, especially at night when you are not up moving around and exerting yourself.  The breathing machine keeps your lungs expanded and your temperature down.  Do not place pillow under knee, focus on keeping the knee straight while resting  DIET You may resume your previous home diet once your are discharged from the hospital.  DRESSING / WOUND CARE / SHOWERING You may shower 3 days after surgery, but keep the wounds dry during showering.  You may use an occlusive plastic wrap (Press'n Seal for example), NO SOAKING/SUBMERGING IN THE BATHTUB.  If the  bandage gets wet, change with a clean dry gauze.  If the incision gets wet, pat the wound dry with a clean towel. You may start showering once you are discharged home but do not submerge the incision under water. Just pat the incision dry and apply a dry gauze dressing on daily. Change the surgical dressing daily and reapply a dry dressing each time.  ACTIVITY Walk with your walker as instructed. Use walker as long as suggested by your caregivers. Avoid periods of inactivity such as sitting longer than an hour when not asleep. This helps prevent blood clots.  You may resume a sexual relationship in one month or when given the OK by your doctor.  You may return to work once you are cleared by your doctor.  Do not drive a car for 6 weeks or until released by you surgeon.  Do not drive while taking narcotics.  WEIGHT BEARING Weight bearing as tolerated with assist device (walker, cane, etc) as directed, use it as long as suggested by your surgeon or therapist, typically at least 4-6 weeks.  POSTOPERATIVE CONSTIPATION PROTOCOL Constipation - defined medically as fewer than three stools per week and severe constipation as less than one stool per week.  One of the most common issues patients have following surgery is constipation.  Even if you have a regular bowel pattern at home, your normal regimen is likely to be disrupted due to multiple reasons following surgery.  Combination of anesthesia, postoperative narcotics, change in appetite and fluid intake all can affect your  bowels.  In order to avoid complications following surgery, here are some recommendations in order to help you during your recovery period.  Colace (docusate) - Pick up an over-the-counter form of Colace or another stool softener and take twice a day as long as you are requiring postoperative pain medications.  Take with a full glass of water daily.  If you experience loose stools or diarrhea, hold the colace until you stool forms  back up.  If your symptoms do not get better within 1 week or if they get worse, check with your doctor.  Dulcolax (bisacodyl) - Pick up over-the-counter and take as directed by the product packaging as needed to assist with the movement of your bowels.  Take with a full glass of water.  Use this product as needed if not relieved by Colace only.   MiraLax (polyethylene glycol) - Pick up over-the-counter to have on hand.  MiraLax is a solution that will increase the amount of water in your bowels to assist with bowel movements.  Take as directed and can mix with a glass of water, juice, soda, coffee, or tea.  Take if you go more than two days without a movement. Do not use MiraLax more than once per day. Call your doctor if you are still constipated or irregular after using this medication for 7 days in a row.  If you continue to have problems with postoperative constipation, please contact the office for further assistance and recommendations.  If you experience "the worst abdominal pain ever" or develop nausea or vomiting, please contact the office immediatly for further recommendations for treatment.  ITCHING  If you experience itching with your medications, try taking only a single pain pill, or even half a pain pill at a time.  You can also use Benadryl over the counter for itching or also to help with sleep.   TED HOSE STOCKINGS Wear the elastic stockings on both legs for three weeks following surgery during the day but you may remove then at night for sleeping.  MEDICATIONS See your medication summary on the After Visit Summary that the nursing staff will review with you prior to discharge.  You may have some home medications which will be placed on hold until you complete the course of blood thinner medication.  It is important for you to complete the blood thinner medication as prescribed by your surgeon.  Continue your approved medications as instructed at time of  discharge.  PRECAUTIONS If you experience chest pain or shortness of breath - call 911 immediately for transfer to the hospital emergency department.  If you develop a fever greater that 101 F, purulent drainage from wound, increased redness or drainage from wound, foul odor from the wound/dressing, or calf pain - CONTACT YOUR SURGEON.                                                   FOLLOW-UP APPOINTMENTS Make sure you keep all of your appointments after your operation with your surgeon and caregivers. You should call the office at the above phone number and make an appointment for approximately two weeks after the date of your surgery or on the date instructed by your surgeon outlined in the "After Visit Summary".   RANGE OF MOTION AND STRENGTHENING EXERCISES  Rehabilitation of the knee is important following a knee  injury or an operation. After just a few days of immobilization, the muscles of the thigh which control the knee become weakened and shrink (atrophy). Knee exercises are designed to build up the tone and strength of the thigh muscles and to improve knee motion. Often times heat used for twenty to thirty minutes before working out will loosen up your tissues and help with improving the range of motion but do not use heat for the first two weeks following surgery. These exercises can be done on a training (exercise) mat, on the floor, on a table or on a bed. Use what ever works the best and is most comfortable for you Knee exercises include:  Leg Lifts - While your knee is still immobilized in a splint or cast, you can do straight leg raises. Lift the leg to 60 degrees, hold for 3 sec, and slowly lower the leg. Repeat 10-20 times 2-3 times daily. Perform this exercise against resistance later as your knee gets better.  Quad and Hamstring Sets - Tighten up the muscle on the front of the thigh (Quad) and hold for 5-10 sec. Repeat this 10-20 times hourly. Hamstring sets are done by pushing the  foot backward against an object and holding for 5-10 sec. Repeat as with quad sets.   Leg Slides: Lying on your back, slowly slide your foot toward your buttocks, bending your knee up off the floor (only go as far as is comfortable). Then slowly slide your foot back down until your leg is flat on the floor again.  Angel Wings: Lying on your back spread your legs to the side as far apart as you can without causing discomfort.  A rehabilitation program following serious knee injuries can speed recovery and prevent re-injury in the future due to weakened muscles. Contact your doctor or a physical therapist for more information on knee rehabilitation.   IF YOU ARE TRANSFERRED TO A SKILLED REHAB FACILITY If the patient is transferred to a skilled rehab facility following release from the hospital, a list of the current medications will be sent to the facility for the patient to continue.  When discharged from the skilled rehab facility, please have the facility set up the patient's [place] prior to being released. Also, the skilled facility will be responsible for providing the patient with their medications at time of release from the facility to include their pain medication, the muscle relaxants, and their blood thinner medication. If the patient is still at the rehab facility at time of the two week follow up appointment, the skilled rehab facility will also need to assist the patient in arranging follow up appointment in our office and any transportation needs.  MAKE SURE YOU:  Understand these instructions.  Get help right away if you are not doing well or get worse.    Pick up stool softner and laxative for home use following surgery while on pain medications. Do not submerge incision under water. Please use good hand washing techniques while changing dressing each day. May shower starting three days after surgery. Please use a clean towel to pat the incision dry following  showers. Continue to use ice for pain and swelling after surgery. Do not use any lotions or creams on the incision until instructed by your surgeon.  Take Xarelto for two and a half more weeks following discharge from the hospital, then discontinue Xarelto. Once the patient has completed the blood thinner regimen, then take a Baby 81 mg Aspirin daily  for three more weeks.   Information on my medicine - XARELTO (Rivaroxaban)   Why was Xarelto prescribed for you? Xarelto was prescribed for you to reduce the risk of blood clots forming after orthopedic surgery. The medical term for these abnormal blood clots is venous thromboembolism (VTE).  What do you need to know about xarelto ? Take your Xarelto ONCE DAILY at the same time every day. You may take it either with or without food.  If you have difficulty swallowing the tablet whole, you may crush it and mix in applesauce just prior to taking your dose.  Take Xarelto exactly as prescribed by your doctor and DO NOT stop taking Xarelto without talking to the doctor who prescribed the medication.  Stopping without other VTE prevention medication to take the place of Xarelto may increase your risk of developing a clot.  After discharge, you should have regular check-up appointments with your healthcare provider that is prescribing your Xarelto.    What do you do if you miss a dose? If you miss a dose, take it as soon as you remember on the same day then continue your regularly scheduled once daily regimen the next day. Do not take two doses of Xarelto on the same day.   Important Safety Information A possible side effect of Xarelto is bleeding. You should call your healthcare provider right away if you experience any of the following: ? Bleeding from an injury or your nose that does not stop. ? Unusual colored urine (red or dark brown) or unusual colored stools (red or black). ? Unusual bruising for unknown reasons. ? A serious  fall or if you hit your head (even if there is no bleeding).  Some medicines may interact with Xarelto and might increase your risk of bleeding while on Xarelto. To help avoid this, consult your healthcare provider or pharmacist prior to using any new prescription or non-prescription medications, including herbals, vitamins, non-steroidal anti-inflammatory drugs (NSAIDs) and supplements.  This website has more information on Xarelto: [email or web address]

## 2017-01-27 NOTE — Evaluation (Signed)
Physical Therapy Evaluation Patient Details Name: [name] PassyGinger S Cressler MRN: [id number] DOB: Jul 06, 1961 Today's Date: 01/27/2017   History of Present Illness  s/p L TKA  Clinical Impression  Pt is s/p TKA resulting in the deficits listed below (see PT Problem List).  Pt will benefit from skilled PT to increase their independence and safety with mobility to allow discharge to the venue listed below.      Follow Up Recommendations DC plan and follow up therapy as arranged by surgeon    Equipment Recommendations  None recommended by PT    Recommendations for Other Services       Precautions / Restrictions Precautions Precautions: Knee;Fall Required Braces or Orthoses: Knee Immobilizer - Left Knee Immobilizer - Left: Discontinue once straight leg raise with < 10 degree lag Restrictions Weight Bearing Restrictions: No Other Position/Activity Restrictions: WBAT      Mobility  Bed Mobility Overal bed mobility: Needs Assistance Bed Mobility: Supine to Sit     Supine to sit: Min assist     General bed mobility comments: assist with LLE  Transfers Overall transfer level: Needs assistance Equipment used: Rolling walker (2 wheeled) Transfers: Sit to/from Stand Sit to Stand: Min guard         General transfer comment: cues for UE/LE placement  Ambulation/Gait Ambulation/Gait assistance: Min guard Ambulation Distance (Feet): 65 Feet Assistive device: Rolling walker (2 wheeled) Gait Pattern/deviations: Step-to pattern;Antalgic     General Gait Details: cues for step length, RW position  Stairs            Wheelchair Mobility    Modified Rankin (Stroke Patients Only)       Balance                                             Pertinent Vitals/Pain Pain Assessment: 0-10 Pain Score: 8  Faces Pain Scale: Hurts little more Pain Location: L knee Pain Descriptors / Indicators: Aching Pain Intervention(s): Limited activity within patient's  tolerance;Monitored during session;Premedicated before session;Patient requesting pain meds-RN notified;Repositioned;Ice applied    Home Living Family/patient expects to be discharged to:: Private residence Living Arrangements: Alone Available Help at Discharge: Family Type of Home: Mobile home Home Access: Stairs to enter Entrance Stairs-Rails: Right;Left;Can reach both Entrance Stairs-Number of Steps: 5-6   Home Equipment: Walker - 2 wheels;Crutches;Cane - single point Additional Comments: high tub; Dr Despina HickAlusio told pt not to shower for 6-8 weeks    Prior Function Level of Independence: Independent         Comments: very active, drives truck     Hand Dominance        Extremity/Trunk Assessment   Upper Extremity Assessment Upper Extremity Assessment: Defer to OT evaluation;Overall WFL for tasks assessed    Lower Extremity Assessment Lower Extremity Assessment: LLE deficits/detail LLE Deficits / Details: ankle WFL; knee extension and hip flexion 2+/5       Communication   Communication: No difficulties (very talkative)  Cognition Arousal/Alertness: Awake/alert Behavior During Therapy: WFL for tasks assessed/performed Overall Cognitive Status:  (mostly WFL; cues for safety)                                 General Comments: pt is very talkative; cues for safety and re-direction to task      General Comments  Exercises Total Joint Exercises Ankle Circles/Pumps: AROM;Both;10 reps Quad Sets: 10 reps;Both;AROM   Assessment/Plan    PT Assessment Patient needs continued PT services  PT Problem List Decreased strength;Decreased range of motion;Decreased activity tolerance;Decreased mobility;Decreased knowledge of use of DME;Pain       PT Treatment Interventions DME instruction;Functional mobility training;Gait training;Therapeutic activities;Therapeutic exercise;Patient/family education    PT Goals (Current goals can be found in the Care Plan  section)  Acute Rehab PT Goals Patient Stated Goal: home PT Goal Formulation: With patient Time For Goal Achievement: 01/27/17 Potential to Achieve Goals: Good    Frequency 7X/week   Barriers to discharge        Co-evaluation               AM-PAC PT "6 Clicks" Daily Activity  Outcome Measure Difficulty turning over in bed (including adjusting bedclothes, sheets and blankets)?: Total Difficulty moving from lying on back to sitting on the side of the bed? : Total Difficulty sitting down on and standing up from a chair with arms (e.g., wheelchair, bedside commode, etc,.)?: Total Help needed moving to and from a bed to chair (including a wheelchair)?: A Little Help needed walking in hospital room?: A Little Help needed climbing 3-5 steps with a railing? : A Little 6 Click Score: 12    End of Session Equipment Utilized During Treatment: Gait belt Activity Tolerance: Patient tolerated treatment well Patient left: in bed;with call bell/phone within reach;with bed alarm set   PT Visit Diagnosis: Difficulty in walking, not elsewhere classified (R26.2);Pain Pain - Right/Left: Left Pain - part of body: Knee    Time: 1005-1036 PT Time Calculation (min) (ACUTE ONLY): 31 min   Charges:   PT Evaluation $PT Eval Low Complexity: 1 Procedure PT Treatments $Gait Training: 8-22 mins   PT G CodesDrucilla Chalet, PT Pager: [phone] 01/27/2017   [facility] 01/27/2017, 10:58 AM

## 2017-01-27 NOTE — Discharge Summary (Signed)
Physician Discharge Summary   Patient ID: [name] [name] MRN: [id number] DOB/AGE: Nov 25, 1960 56 y.o.  Admit date: 01/26/2017 Discharge date: 01/28/2017  Primary Diagnosis:  Osteoarthritis  Left knee(s)  Admission Diagnoses:  Past Medical History:  Diagnosis Date  . Arthritis   . GERD (gastroesophageal reflux disease)   . History of kidney stones   . Kidney stones   . Migraine   . Restless leg syndrome   . Swelling of lower limb    bialteral lower leg swelling   Discharge Diagnoses:   Principal Problem:   OA (osteoarthritis) of knee  Estimated body mass index is 29.95 kg/m as calculated from the following:   Height as of this encounter: _0  (1.651 m).   Weight as of this encounter: 81.6 kg (180 lb).  Procedure:  Procedure(s) (LRB): LEFT TOTAL KNEE ARTHROPLASTY (Left)   Consults: None  HPI: [name] [name] is a 56 y.o. year old female with end stage OA of her left knee with progressively worsening pain and dysfunction. She has constant pain, with activity and at rest and significant functional deficits with difficulties even with ADLs. She has had extensive non-op management including analgesics, injections of cortisone and viscosupplements, and home exercise program, but remains in significant pain with significant dysfunction. Radiographs show bone on bone arthritis medial and patellofemoral. She presents now for left Total Knee Arthroplasty.     Laboratory Data: Admission on 01/26/2017  Component Date Value Ref Range Status  . WBC 01/27/2017 11.6* 4.0 - 10.5 K/uL Final  . RBC 01/27/2017 3.72* 3.87 - 5.11 MIL/uL Final  . Hemoglobin 01/27/2017 9.5* 12.0 - 15.0 g/dL Final  . HCT 01/27/2017 31.0* 36.0 - 46.0 % Final  . MCV 01/27/2017 83.3  78.0 - 100.0 fL Final  . MCH 01/27/2017 25.5* 26.0 - 34.0 pg Final  . MCHC 01/27/2017 30.6  30.0 - 36.0 g/dL Final  . RDW 01/27/2017 16.9* 11.5 - 15.5 % Final  . Platelets 01/27/2017 202  150 - 400 K/uL Final  . Sodium  01/27/2017 139  135 - 145 mmol/L Final  . Potassium 01/27/2017 4.4  3.5 - 5.1 mmol/L Final  . Chloride 01/27/2017 108  101 - 111 mmol/L Final  . CO2 01/27/2017 25  22 - 32 mmol/L Final  . Glucose, Bld 01/27/2017 166* 65 - 99 mg/dL Final  . BUN 01/27/2017 9  6 - 20 mg/dL Final  . Creatinine, Ser 01/27/2017 0.65  0.44 - 1.00 mg/dL Final  . Calcium 01/27/2017 8.9  8.9 - 10.3 mg/dL Final  . GFR calc non Af Amer 01/27/2017 >60  >60 mL/min Final  . GFR calc Af Amer 01/27/2017 >60  >60 mL/min Final   Comment: (NOTE) The eGFR has been calculated using the CKD EPI equation. This calculation has not been validated in all clinical situations. eGFR's persistently <60 mL/min signify possible Chronic Kidney Disease.   Georgiann Hahn gap 01/27/2017 6  5 - 15 Final  Hospital Outpatient Visit on 01/19/2017  Component Date Value Ref Range Status  . aPTT 01/19/2017 27  24 - 36 seconds Final  . WBC 01/19/2017 6.0  4.0 - 10.5 K/uL Final  . RBC 01/19/2017 4.15  3.87 - 5.11 MIL/uL Final  . Hemoglobin 01/19/2017 10.4* 12.0 - 15.0 g/dL Final  . HCT 01/19/2017 34.2* 36.0 - 46.0 % Final  . MCV 01/19/2017 82.4  78.0 - 100.0 fL Final  . MCH 01/19/2017 25.1* 26.0 - 34.0 pg Final  . MCHC 01/19/2017 30.4  30.0 -  36.0 g/dL Final  . RDW 01/19/2017 17.5* 11.5 - 15.5 % Final  . Platelets 01/19/2017 221  150 - 400 K/uL Final  . Sodium 01/19/2017 139  135 - 145 mmol/L Final  . Potassium 01/19/2017 4.8  3.5 - 5.1 mmol/L Final  . Chloride 01/19/2017 107  101 - 111 mmol/L Final  . CO2 01/19/2017 28  22 - 32 mmol/L Final  . Glucose, Bld 01/19/2017 100* 65 - 99 mg/dL Final  . BUN 01/19/2017 12  6 - 20 mg/dL Final  . Creatinine, Ser 01/19/2017 0.88  0.44 - 1.00 mg/dL Final  . Calcium 01/19/2017 9.4  8.9 - 10.3 mg/dL Final  . Total Protein 01/19/2017 7.1  6.5 - 8.1 g/dL Final  . Albumin 01/19/2017 4.0  3.5 - 5.0 g/dL Final  . AST 01/19/2017 17  15 - 41 U/L Final  . ALT 01/19/2017 12* 14 - 54 U/L Final  . Alkaline Phosphatase  01/19/2017 143* 38 - 126 U/L Final  . Total Bilirubin 01/19/2017 0.2* 0.3 - 1.2 mg/dL Final  . GFR calc non Af Amer 01/19/2017 >60  >60 mL/min Final  . GFR calc Af Amer 01/19/2017 >60  >60 mL/min Final   Comment: (NOTE) The eGFR has been calculated using the CKD EPI equation. This calculation has not been validated in all clinical situations. eGFR's persistently <60 mL/min signify possible Chronic Kidney Disease.   . Anion gap 01/19/2017 4* 5 - 15 Final  . Prothrombin Time 01/19/2017 12.3  11.4 - 15.2 seconds Final  . INR 01/19/2017 0.92   Final  . ABO/RH(D) 01/19/2017 O POS   Final  . Antibody Screen 01/19/2017 NEG   Final  . Sample Expiration 01/19/2017 01/29/2017   Final  . Extend sample reason 01/19/2017 NO TRANSFUSIONS OR PREGNANCY IN THE PAST 3 MONTHS   Final  . MRSA, PCR 01/19/2017 NEGATIVE  NEGATIVE Final  . Staphylococcus aureus 01/19/2017 NEGATIVE  NEGATIVE Final   Comment:        The Xpert SA Assay (FDA approved for NASAL specimens in patients over 59 years of age), is one component of a comprehensive surveillance program.  Test performance has been validated by [facility] for patients greater than or equal to 58 year old. It is not intended to diagnose infection nor to guide or monitor treatment.   . ABO/RH(D) 01/19/2017 O POS   Final     X-Rays:No results found.  EKG: Orders placed or performed during the hospital encounter of 12/28/12  . EKG 12-Lead  . EKG 12-Lead     Hospital Course: [name] [name] is a 56 y.o. who was admitted to [facility]. They were brought to the operating room on 01/26/2017 and underwent Procedure(s): LEFT TOTAL KNEE ARTHROPLASTY.  Patient tolerated the procedure well and was later transferred to the recovery room and then to the orthopaedic floor for postoperative care.  They were given PO and IV analgesics for pain control following their surgery.  They were given 24 hours of postoperative antibiotics of  Anti-infectives     Start     Dose/Rate Route Frequency Ordered Stop   01/26/17 1800  vancomycin (VANCOCIN) IVPB 1000 mg/200 mL premix     1,000 mg 200 mL/hr over 60 Minutes Intravenous Every 12 hours 01/26/17 1413 01/26/17 1805   01/26/17 0836  ceFAZolin (ANCEF) 2-4 GM/100ML-% IVPB    Comments:  Algis Liming   : cabinet override      01/26/17 4098 01/26/17 1104   01/26/17 1191  ceFAZolin (ANCEF) IVPB 2g/100 mL premix     2 g 200 mL/hr over 30 Minutes Intravenous On call to O.R. 01/26/17 5597 01/26/17 1119     and started on DVT prophylaxis in the form of Xarelto.   PT and OT were ordered for total joint protocol.  Discharge planning consulted to help with postop disposition and equipment needs.  Patient had a decent night on the evening of surgery.  They started to get up OOB with therapy on day one. Hemovac drain was pulled without difficulty.  Continued to work with therapy into day two.  Dressing was changed on day two and the incision was healing well. Patient was seen in rounds and was ready to go home.   Diet: Regular diet Activity:WBAT Follow-up:in 2 weeks Disposition - Home Discharged Condition: stable   Discharge Instructions    Call MD / Call 911    Complete by:  As directed    If you experience chest pain or shortness of breath, CALL 911 and be transported to the hospital emergency room.  If you develope a fever above 101 F, pus (white drainage) or increased drainage or redness at the wound, or calf pain, call your surgeon's office.   Change dressing    Complete by:  As directed    Change dressing daily with sterile 4 x 4 inch gauze dressing and apply TED hose. Do not submerge the incision under water.   Constipation Prevention    Complete by:  As directed    Drink plenty of fluids.  Prune juice may be helpful.  You may use a stool softener, such as Colace (over the counter) 100 mg twice a day.  Use MiraLax (over the counter) for constipation as needed.   Diet - low sodium heart healthy     Complete by:  As directed    Discharge instructions    Complete by:  As directed    Take Xarelto for two and a half more weeks, then discontinue Xarelto. Once the patient has completed the blood thinner regimen, then take a Baby 81 mg Aspirin daily for three more weeks.   Pick up stool softner and laxative for home use following surgery while on pain medications. Do not submerge incision under water. Please use good hand washing techniques while changing dressing each day. May shower starting three days after surgery. Please use a clean towel to pat the incision dry following showers. Continue to use ice for pain and swelling after surgery. Do not use any lotions or creams on the incision until instructed by your surgeon.  Wear both TED hose on both legs during the day every day for three weeks, but may remove the TED hose at night at home.  Postoperative Constipation Protocol  Constipation - defined medically as fewer than three stools per week and severe constipation as less than one stool per week.  One of the most common issues patients have following surgery is constipation.  Even if you have a regular bowel pattern at home, your normal regimen is likely to be disrupted due to multiple reasons following surgery.  Combination of anesthesia, postoperative narcotics, change in appetite and fluid intake all can affect your bowels.  In order to avoid complications following surgery, here are some recommendations in order to help you during your recovery period.  Colace (docusate) - Pick up an over-the-counter form of Colace or another stool softener and take twice a day as long as you are requiring postoperative pain medications.  Take with a full glass of water daily.  If you experience loose stools or diarrhea, hold the colace until you stool forms back up.  If your symptoms do not get better within 1 week or if they get worse, check with your doctor.  Dulcolax (bisacodyl) - Pick up  over-the-counter and take as directed by the product packaging as needed to assist with the movement of your bowels.  Take with a full glass of water.  Use this product as needed if not relieved by Colace only.   MiraLax (polyethylene glycol) - Pick up over-the-counter to have on hand.  MiraLax is a solution that will increase the amount of water in your bowels to assist with bowel movements.  Take as directed and can mix with a glass of water, juice, soda, coffee, or tea.  Take if you go more than two days without a movement. Do not use MiraLax more than once per day. Call your doctor if you are still constipated or irregular after using this medication for 7 days in a row.  If you continue to have problems with postoperative constipation, please contact the office for further assistance and recommendations.  If you experience "the worst abdominal pain ever" or develop nausea or vomiting, please contact the office immediatly for further recommendations for treatment.   Do not put a pillow under the knee. Place it under the heel.    Complete by:  As directed    Do not sit on low chairs, stoools or toilet seats, as it may be difficult to get up from low surfaces    Complete by:  As directed    Driving restrictions    Complete by:  As directed    No driving until released by the physician.   Increase activity slowly as tolerated    Complete by:  As directed    Lifting restrictions    Complete by:  As directed    No lifting until released by the physician.   Patient may shower    Complete by:  As directed    You may shower without a dressing once there is no drainage.  Do not wash over the wound.  If drainage remains, do not shower until drainage stops.   TED hose    Complete by:  As directed    Use stockings (TED hose) for 3 weeks on both leg(s).  You may remove them at night for sleeping.   Weight bearing as tolerated    Complete by:  As directed    Laterality:  left   Extremity:  Lower       Allergies as of 01/27/2017      Reactions   Acetaminophen Other (See Comments)   Very sleepy   Topamax [topiramate] Other (See Comments)   Kidney stones   Penicillins Hives   FEB 2018 - broke out in hives          Durable Medical Equipment        Start     Ordered   01/27/17 1127  For home use only DME 3 n 1  Once     01/27/17 1126     Follow-up Information    Aluisio, Pilar Plate, MD. Schedule an appointment as soon as possible for a visit on 02/10/2017.   Specialty:  Orthopedic Surgery Contact information: [address] [place] [address] [phone]           Signed: Arlee Muslim, PA-C Orthopaedic Surgery 01/27/2017, 9:45 PM

## 2017-01-27 NOTE — Progress Notes (Signed)
Discharge plan:  Pt planning on HHPT with [facility] and orders received. Pt in need of 3in1 and orders received. [facility] rep alerted of need for DME. Sandford Crazeora  RN,BSN,NCM [phone]

## 2017-01-28 LAB — BASIC METABOLIC PANEL
Anion gap: 8 (ref 5–15)
BUN: 14 mg/dL (ref 6–20)
CO2: 28 mmol/L (ref 22–32)
CREATININE: 0.63 mg/dL (ref 0.44–1.00)
Calcium: 9.1 mg/dL (ref 8.9–10.3)
Chloride: 104 mmol/L (ref 101–111)
GFR calc Af Amer: >60 mL/min (ref >60)
Glucose, Bld: 133 mg/dL — ABNORMAL HIGH (ref 65–99)
POTASSIUM: 4.4 mmol/L (ref 3.5–5.1)
SODIUM: 140 mmol/L (ref 135–145)

## 2017-01-28 LAB — CBC
HCT: 30.5 % — ABNORMAL LOW (ref 36.0–46.0)
Hemoglobin: 9.3 g/dL — ABNORMAL LOW (ref 12.0–15.0)
MCH: 25.5 pg — AB (ref 26.0–34.0)
MCHC: 30.5 g/dL (ref 30.0–36.0)
MCV: 83.8 fL (ref 78.0–100.0)
Platelets: 228 10*3/uL (ref 150–400)
RBC: 3.64 MIL/uL — ABNORMAL LOW (ref 3.87–5.11)
RDW: 17.3 % — ABNORMAL HIGH (ref 11.5–15.5)
WBC: 12 10*3/uL — ABNORMAL HIGH (ref 4.0–10.5)

## 2017-01-28 NOTE — Progress Notes (Signed)
Occupational Therapy Treatment Patient Details Name: [name] [name] MRN: [id number] DOB: July 21, 1961 Today's Date: 01/28/2017    History of present illness s/p L TKA   OT comments  Much improved safety today  Follow Up Recommendations  Supervision/Assistance - 24 hour    Equipment Recommendations  3 in 1 bedside commode    Recommendations for Other Services      Precautions / Restrictions Precautions Precautions: Knee;Fall Required Braces or Orthoses: Knee Immobilizer - Left Knee Immobilizer - Left: Discontinue once straight leg raise with < 10 degree lag Restrictions Weight Bearing Restrictions: No Other Position/Activity Restrictions: WBAT       Mobility Bed Mobility         Supine to sit: Min guard     General bed mobility comments: once KI applied  Transfers Overall transfer level: Needs assistance Equipment used: Rolling walker (2 wheeled)   Sit to Stand: Supervision         General transfer comment: no cues needed today    Balance                                           ADL either performed or assessed with clinical judgement   ADL Overall ADL's : Needs assistance/impaired                         Toilet Transfer: Supervision/safety;BSC;RW;Ambulation             General ADL Comments: retrieved clothing from closet at supervision level.  Issued pt a reacher and long sponge.   Much improved safety today     Vision       Perception     Praxis      Cognition Arousal/Alertness: Awake/alert Behavior During Therapy: WFL for tasks assessed/performed Overall Cognitive Status: Within Functional Limits for tasks assessed                                          Exercises     Shoulder Instructions       General Comments      Pertinent Vitals/ Pain       Pain Score: 10-Worst pain ever Pain Location: L knee Pain Descriptors / Indicators: Aching Pain Intervention(s): Limited  activity within patient's tolerance;Monitored during session;Premedicated before session;Repositioned;Ice applied  Home Living                                          Prior Functioning/Environment              Frequency           Progress Toward Goals  OT Goals(current goals can now be found in the care plan section)  Progress towards OT goals: Progressing toward goals  Acute Rehab OT Goals Patient Stated Goal: home  Plan      Co-evaluation                 AM-PAC PT "6 Clicks" Daily Activity     Outcome Measure   Help from another person eating meals?: None Help from another person taking care of personal grooming?: A Little Help from another person toileting, which includes  using toliet, bedpan, or urinal?: A Little Help from another person bathing (including washing, rinsing, drying)?: A Little Help from another person to put on and taking off regular upper body clothing?: A Little Help from another person to put on and taking off regular lower body clothing?: A Little 6 Click Score: 19    End of Session CPM Left Knee CPM Left Knee: Off  OT Visit Diagnosis: Muscle weakness (generalized) (M62.81);Pain Pain - Right/Left: Left Pain - part of body: Knee   Activity Tolerance Patient tolerated treatment well   Patient Left in chair;with call bell/phone within reach;with chair alarm set   Nurse Communication          Time: 1610-9604 OT Time Calculation (min): 20 min  Charges: OT General Charges $OT Visit: 1 Procedure OT Treatments $Self Care/Home Management : 8-22 mins  Marica Otter, OTR/L [phone] 01/28/2017   , 01/28/2017, 10:07 AM

## 2017-01-28 NOTE — Progress Notes (Signed)
Physical Therapy Treatment Patient Details Name: [name] [name] MRN: [id number] DOB: 1961-04-05 Today's Date: 01/28/2017    History of Present Illness s/p L TKA    PT Comments    POD # 2 Applied KI and instructed on use.  Assisted with amb in hallway, practiced one step to enter her home and performed TKR TE HEP following HEP.  Instructed on proper tech, freq as well as use of ICE.   Follow Up Recommendations  DC plan and follow up therapy as arranged by surgeon     Equipment Recommendations  3in1 (PT) (pt stated her mothers walker she was planning to use has no wheels)    Recommendations for Other Services       Precautions / Restrictions Precautions Precautions: Knee;Fall Precaution Comments: instructed on KI use  Required Braces or Orthoses: Knee Immobilizer - Left Knee Immobilizer - Left: Discontinue once straight leg raise with < 10 degree lag Restrictions Weight Bearing Restrictions: No Other Position/Activity Restrictions: WBAT    Mobility  Bed Mobility         Supine to sit: Min guard     General bed mobility comments: OOB in recliner  Transfers Overall transfer level: Needs assistance Equipment used: Rolling walker (2 wheeled) Transfers: Sit to/from Stand Sit to Stand: Supervision         General transfer comment: increased time  Ambulation/Gait Ambulation/Gait assistance: Supervision Ambulation Distance (Feet): 80 Feet Assistive device: Rolling walker (2 wheeled) Gait Pattern/deviations: Step-to pattern;Antalgic Gait velocity: decreased   General Gait Details: one VC safety with turns    Stairs Stairs: Yes   Stair Management: No rails;Forwards;Step to pattern Number of Stairs: 1 General stair comments: one initial VC on proper walker placement and sequencing  Wheelchair Mobility    Modified Rankin (Stroke Patients Only)       Balance                                            Cognition  Arousal/Alertness: Awake/alert Behavior During Therapy: WFL for tasks assessed/performed Overall Cognitive Status: Within Functional Limits for tasks assessed                                        Exercises   Total Knee Replacement TE's 10 reps B LE ankle pumps 10 reps towel squeezes 10 reps knee presses 10 reps heel slides  10 reps SAQ's 10 reps SLR's 10 reps ABD Followed by ICE     General Comments        Pertinent Vitals/Pain Pain Assessment: 0-10 Pain Score: 10-Worst pain ever Faces Pain Scale: Hurts a little bit Pain Location: L knee Pain Descriptors / Indicators: Operative site guarding;Aching Pain Intervention(s): Monitored during session;Repositioned;Ice applied    Home Living                      Prior Function            PT Goals (current goals can now be found in the care plan section) Acute Rehab PT Goals Patient Stated Goal: home Progress towards PT goals: Progressing toward goals    Frequency    7X/week      PT Plan Current plan remains appropriate    Co-evaluation  AM-PAC PT "6 Clicks" Daily Activity  Outcome Measure  Difficulty turning over in bed (including adjusting bedclothes, sheets and blankets)?: Total Difficulty moving from lying on back to sitting on the side of the bed? : Total Difficulty sitting down on and standing up from a chair with arms (e.g., wheelchair, bedside commode, etc,.)?: Total Help needed moving to and from a bed to chair (including a wheelchair)?: A Little Help needed walking in hospital room?: A Little Help needed climbing 3-5 steps with a railing? : A Little 6 Click Score: 12    End of Session Equipment Utilized During Treatment: Gait belt Activity Tolerance: Patient tolerated treatment well;No increased pain Patient left: in chair;with call bell/phone within reach Nurse Communication:  (pt ready for D/C to home) PT Visit Diagnosis: Difficulty in walking, not  elsewhere classified (R26.2);Pain Pain - Right/Left: Left Pain - part of body: Knee     Time: 0920-1000 PT Time Calculation (min) (ACUTE ONLY): 40 min  Charges:  $Gait Training: 8-22 mins $Therapeutic Exercise: 8-22 mins $Therapeutic Activity: 8-22 mins                    G Codes:       {   PTA [facility]  Acute  Rehab Pager      [phone]

## 2017-01-29 NOTE — Progress Notes (Signed)
   LATE ENTRY NOTE Date of Service of Visit - 01/28/2017  Subjective: 2 Days Post-Op Procedure(s) (LRB): LEFT TOTAL KNEE ARTHROPLASTY (Left) Patient reports pain as mild and moderate.   Patient seen in rounds for Dr. Wynelle Link. Patient is well, but has had some minor complaints of pain in the knee, requiring pain medications Patient is ready to go home   Objective: Vital signs in last 24 hours:    Intake/Output from previous day: No intake or output data in the 24 hours ending 01/29/17 0929  Intake/Output this shift: Total I/O In: 1943 [P.O.:1800; I.V.:143] Out: 700 [Urine:700]  Labs:  Recent Labs  01/27/17 0406 01/28/17 0406  HGB 9.5* 9.3*    Recent Labs  01/27/17 0406 01/28/17 0406  WBC 11.6* 12.0*  RBC 3.72* 3.64*  HCT 31.0* 30.5*  PLT 202 228    Recent Labs  01/27/17 0406 01/28/17 0406  NA 139 140  K 4.4 4.4  CL 108 104  CO2 25 28  BUN 9 14  CREATININE 0.65 0.63  GLUCOSE 166* 133*  CALCIUM 8.9 9.1   No results for input(s): LABPT, INR in the last 72 hours.  EXAM: General - Patient is Alert, Appropriate and Oriented Extremity - Neurovascular intact Sensation intact distally Intact pulses distally Dorsiflexion/Plantar flexion intact Incision - clean, dry, no drainage Motor Function - intact, moving foot and toes well on exam.   Assessment/Plan: 3 Days Post-Op Procedure(s) (LRB): LEFT TOTAL KNEE ARTHROPLASTY (Left) Procedure(s) (LRB): LEFT TOTAL KNEE ARTHROPLASTY (Left) Past Medical History:  Diagnosis Date  . Arthritis   . GERD (gastroesophageal reflux disease)   . History of kidney stones   . Kidney stones   . Migraine   . Restless leg syndrome   . Swelling of lower limb    bialteral lower leg swelling   Principal Problem:   OA (osteoarthritis) of knee  Estimated body mass index is 29.95 kg/m as calculated from the following:   Height as of this encounter: 5' 5"  (1.651 m).   Weight as of this encounter: 81.6 kg (180 lb). Up with  therapy Discharge home with home health Diet - Regular diet Follow up - in 2 weeks Activity - WBAT Disposition - Home Condition Upon Discharge - Stable D/C Meds - See DC Summary DVT Prophylaxis - Xarelto  Arlee Muslim, PA-C Orthopaedic Surgery 01/29/2017, 9:29 AM

## 2017-02-19 ENCOUNTER — Ambulatory Visit: Payer: Medicare Other | Attending: Orthopedic Surgery | Admitting: Physical Therapy

## 2017-02-19 DIAGNOSIS — G8929 Other chronic pain: Secondary | ICD-10-CM | POA: Insufficient documentation

## 2017-02-19 DIAGNOSIS — M25662 Stiffness of left knee, not elsewhere classified: Secondary | ICD-10-CM | POA: Diagnosis present

## 2017-02-19 DIAGNOSIS — R600 Localized edema: Secondary | ICD-10-CM | POA: Diagnosis present

## 2017-02-19 DIAGNOSIS — M25562 Pain in left knee: Secondary | ICD-10-CM | POA: Insufficient documentation

## 2017-02-19 NOTE — Therapy (Signed)
[facility] [address] [place], [place], [address] Phone: [phone]   Fax:  [phone]  Physical Therapy Evaluation  Patient Details  Name: [name] [name] MRN: [id number] Date of Birth: [birth year] Referring Provider: Ollen Gross MD.  Encounter Date: 02/19/2017      PT End of Session - 02/19/17 1424    Visit Number 1   Number of Visits 12   Date for PT Re-Evaluation 03/19/17   PT Start Time 0145   PT Stop Time 0233   PT Time Calculation (min) 48 min   Activity Tolerance Patient tolerated treatment well   Behavior During Therapy [facility] for tasks assessed/performed      Past Medical History:  Diagnosis Date  . Arthritis   . GERD (gastroesophageal reflux disease)   . History of kidney stones   . Kidney stones   . Migraine   . Restless leg syndrome   . Swelling of lower limb    bialteral lower leg swelling    Past Surgical History:  Procedure Laterality Date  . MULTIPLE TOOTH EXTRACTIONS    . right eye lid surgery     . TOTAL KNEE ARTHROPLASTY Left 01/26/2017   Procedure: LEFT TOTAL KNEE ARTHROPLASTY;  Surgeon: Ollen Gross, MD;  Location: [facility];  Service: Orthopedics;  Laterality: Left;  with block  . TUBAL LIGATION      There were no vitals filed for this visit.       Subjective Assessment - 02/19/17 1417    Subjective The patient is s/p left total knee replacement performed on 01/26/17.  She is doing very well thus far and is very motivated.  her pain-level is a 4/10 today.  Increase up time increases pain and ice decreases pain.   Pertinent History Left LE fracture as a teenager.   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   Currently in Pain? Yes   Pain Score 4    Pain Location Knee   Pain Orientation Left   Pain Descriptors / Indicators Aching   Pain Type Surgical pain   Pain Onset 1 to 4 weeks ago   Pain Frequency Constant   Aggravating Factors  See above.   Pain Relieving Factors See  above.            [facility] PT Assessment - 02/19/17 0001      Assessment   Medical Diagnosis Left total knee replacement.   Referring Provider Ollen Gross MD.   Onset Date/Surgical Date --  01/26/17 (surgery date).     Precautions   Precautions --  No ultrasound.     Restrictions   Weight Bearing Restrictions No     Balance Screen   Has the patient fallen in the past 6 months No   Has the patient had a decrease in activity level because of a fear of falling?  Yes   Is the patient reluctant to leave their home because of a fear of falling?  No     Home Environment   Living Environment Private residence     Prior Function   Level of Independence Independent     Observation/Other Assessments   Observations Left knee incisional site is healing well.     Observation/Other Assessments-Edema    Edema --  Moderate- left knee edema.     ROM / Strength   AROM / PROM / Strength AROM;Strength     AROM   Overall AROM Comments -5 degrees to 105 degrees of  active left knee range of motion and 110 degrees passively.     Strength   Overall Strength Comments Left knee and hip strength= 4+/5.     Palpation   Palpation comment Tender around left kneecap.     Ambulation/Gait   Gait Comments Antalgic gait with a right knee brace with patellar orifice.            Objective measurements completed on examination: See above findings.          [facility] Adult PT Treatment/Exercise - 02/19/17 0001      Modalities   Modalities Electrical Stimulation;Vasopneumatic     Electrical Stimulation   Electrical Stimulation Location Left knee.   Electrical Stimulation Action IFC x 15 minutes.   Electrical Stimulation Goals Edema;Pain     Vasopneumatic   Number Minutes Vasopneumatic  15 minutes   Vasopnuematic Location  --  Left knee.   Vasopneumatic Pressure Medium                  PT Short Term Goals - 02/19/17 1436      PT SHORT TERM GOAL #1   Title  Independent with an initial HEP.   Time 2   Period Weeks   Status New     PT SHORT TERM GOAL #2   Title Full active left knee extension in order to normalize gait   Time 2   Period Weeks   Status New           PT Long Term Goals - 02/19/17 1437      PT LONG TERM GOAL #1   Title Independent with an advanced HEP.   Time 4   Period Weeks   Status New     PT LONG TERM GOAL #2   Title Active left knee flexion to 120-125 degrees+ so the patient can perform functional tasks and do so with pain not > 2-3/10.   Time 4   Period Weeks   Status New     PT LONG TERM GOAL #3   Title Increase left knee strength to a solid 5/5 to provide good stability for accomplishment of functional activities   Time 4   Period Weeks   Status New     PT LONG TERM GOAL #4   Title Perform a reciprocating stair gait with one railing with pain not > 2-3/10.   Time 4   Period Weeks   Status New                Plan - 02/19/17 1426    Clinical Impression Statement The patient is s/p left total knee replacement performed on 01/26/17.  Active range of motion is -5 to 105 degrees and she has only a minimal loss of strength.  Edema in moderate-.  She is ambulating wihtout an asistive device with right knee brace with patellar orifice.  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  Patient will benefit from skilled physical therapy to address deficits and meet goals.   Clinical Presentation Stable   Clinical Decision Making Low   Rehab Potential Excellent   PT Frequency 3x / week   PT Duration 4 weeks   PT Treatment/Interventions ADLs/Self Care Home Management;Cryotherapy;Electrical Stimulation;Therapeutic activities;Therapeutic exercise;Patient/family education;Passive range of motion;Manual techniques;Vasopneumatic Device   PT Next Visit Plan Total knee protocol.  e'stim and vasopneumatic.      Patient will benefit from skilled therapeutic  intervention in order to improve  the following deficits and impairments:  Abnormal gait, Decreased activity tolerance, Decreased range of motion, Decreased strength, Increased edema, Pain  Visit Diagnosis: Chronic pain of left knee - Plan: PT plan of care cert/re-cert  Stiffness of left knee, not elsewhere classified - Plan: PT plan of care cert/re-cert  Localized edema - Plan: PT plan of care cert/re-cert      G-Codes - 02/19/17 1408    Functional Assessment Tool Used (Outpatient Only) FOTO....53% limitation.   Functional Limitation Mobility: Walking and moving around   Mobility: Walking and Moving Around Current Status [phone](G8978) At least 40 percent but less than 60 percent impaired, limited or restricted   Mobility: Walking and Moving Around Goal Status [phone](G8979) At least 20 percent but less than 40 percent impaired, limited or restricted       Problem List Patient Active Problem List   Diagnosis Date Noted  . OA (osteoarthritis) of knee 01/26/2017  . Nephrolithiasis 11/15/2013  . Chest pain 10/22/2012  . Migraine 10/22/2012  . Numbness and tingling in hands 10/22/2012    , [place] MPT 02/19/2017, 2:40 PM  [facility] [address] [place], [place], [address] Phone: [phone]   Fax:  [phone]  Name: [name] PassyGinger S [name] MRN: [id number] Date of Birth: [birth year]

## 2017-02-23 ENCOUNTER — Ambulatory Visit: Payer: Medicare Other | Admitting: Physical Therapy

## 2017-02-23 DIAGNOSIS — M25662 Stiffness of left knee, not elsewhere classified: Secondary | ICD-10-CM

## 2017-02-23 DIAGNOSIS — M25562 Pain in left knee: Principal | ICD-10-CM

## 2017-02-23 DIAGNOSIS — R600 Localized edema: Secondary | ICD-10-CM

## 2017-02-23 DIAGNOSIS — G8929 Other chronic pain: Secondary | ICD-10-CM

## 2017-02-23 NOTE — Therapy (Signed)
[facility] [place], [place], [address] Phone: [phone]   Fax:  [phone]  Physical Therapy Treatment  Patient Details  Name: [name] [name] MRN: [id number] Date of Birth: 1961/04/11 Referring Provider: Gaynelle Arabian MD.  Encounter Date: 02/23/2017      PT End of Session - 02/23/17 1648    Visit Number 2   Number of Visits 12   Date for PT Re-Evaluation 03/19/17   PT Start Time 0445   PT Stop Time 0543   PT Time Calculation (min) 58 min   Activity Tolerance Patient tolerated treatment well   Behavior During Therapy [facility] for tasks assessed/performed      Past Medical History:  Diagnosis Date  . Arthritis   . GERD (gastroesophageal reflux disease)   . History of kidney stones   . Kidney stones   . Migraine   . Restless leg syndrome   . Swelling of lower limb    bialteral lower leg swelling    Past Surgical History:  Procedure Laterality Date  . MULTIPLE TOOTH EXTRACTIONS    . right eye lid surgery     . TOTAL KNEE ARTHROPLASTY Left 01/26/2017   Procedure: LEFT TOTAL KNEE ARTHROPLASTY;  Surgeon: Gaynelle Arabian, MD;  [facility];  Service: Orthopedics;  Laterality: Left;  with block  . TUBAL LIGATION      There were no vitals filed for this visit.      Subjective Assessment - 02/23/17 1649    Subjective Weather makes me hurt.     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   Pain Score 6    Pain Location Knee   Pain Orientation Left   Pain Descriptors / Indicators Aching   Pain Onset 1 to 4 weeks ago                         [facility] Adult PT Treatment/Exercise - 02/23/17 0001      Exercises   Exercises Knee/Hip;Ankle     Knee/Hip Exercises: Aerobic   Nustep Level 2 moving forward x 2 over 18 minutes.     Knee/Hip Exercises: Supine   Quad Sets Limitations 5# x 5 minutes.     Modalities   Modalities Pharmacologist Stimulation Location Left knee.   Electrical Stimulation Action IFC    Electrical Stimulation Parameters 1-10 Hz x 15 minutes.   Electrical Stimulation Goals Edema;Pain     Vasopneumatic   Number Minutes Vasopneumatic  15 minutes   Vasopnuematic Location  --  Left knee.   Vasopneumatic Pressure Medium     Manual Therapy   Manual Therapy Passive ROM   Passive ROM In supine PROM x 5 minutes to patient's left knee.     Ankle Exercises: Standing   Rocker Board Limitations 3 minutes.                  PT Short Term Goals - 02/19/17 1436      PT SHORT TERM GOAL #1   Title Independent with an initial HEP.   Time 2   Period Weeks   Status New     PT SHORT TERM GOAL #2   Title Full active left knee extension in order to normalize gait   Time 2   Period Weeks   Status New           PT Long  Term Goals - 02/19/17 1437      PT LONG TERM GOAL #1   Title Independent with an advanced HEP.   Time 4   Period Weeks   Status New     PT LONG TERM GOAL #2   Title Active left knee flexion to 120-125 degrees+ so the patient can perform functional tasks and do so with pain not > 2-3/10.   Time 4   Period Weeks   Status New     PT LONG TERM GOAL #3   Title Increase left knee strength to a solid 5/5 to provide good stability for accomplishment of functional activities   Time 4   Period Weeks   Status New     PT LONG TERM GOAL #4   Title Perform a reciprocating stair gait with one railing with pain not > 2-3/10.   Time 4   Period Weeks   Status New               Plan - 02/23/17 1734    Clinical Impression Statement Patient did great today with ther ex.   Rehab Potential Excellent      Patient will benefit from skilled therapeutic intervention in order to improve the following deficits and impairments:  Abnormal gait, Decreased activity tolerance, Decreased range of motion, Decreased strength, Increased edema, Pain  Visit  Diagnosis: Chronic pain of left knee  Stiffness of left knee, not elsewhere classified  Localized edema     Problem List Patient Active Problem List   Diagnosis Date Noted  . OA (osteoarthritis) of knee 01/26/2017  . Nephrolithiasis 11/15/2013  . Chest pain 10/22/2012  . Migraine 10/22/2012  . Numbness and tingling in hands 10/22/2012    [place], [place] MPT 02/23/2017, 5:44 PM  [facility] [address] [place], [place], [address] Phone: [phone]   Fax:  [phone]  Name: [name] [name] MRN: [id number] Date of Birth: Aug 20, 1960

## 2017-02-25 ENCOUNTER — Ambulatory Visit: Payer: Medicare Other | Admitting: Physical Therapy

## 2017-02-25 ENCOUNTER — Encounter: Payer: Self-pay | Admitting: Physical Therapy

## 2017-02-25 DIAGNOSIS — M25662 Stiffness of left knee, not elsewhere classified: Secondary | ICD-10-CM

## 2017-02-25 DIAGNOSIS — M25562 Pain in left knee: Principal | ICD-10-CM

## 2017-02-25 DIAGNOSIS — G8929 Other chronic pain: Secondary | ICD-10-CM

## 2017-02-25 DIAGNOSIS — R600 Localized edema: Secondary | ICD-10-CM

## 2017-02-25 NOTE — Therapy (Signed)
[facility] [address] [place], [place], [address] Phone: [phone]   Fax:  [phone]  Physical Therapy Treatment  Patient Details  Name: [name] PassyGinger S Erion MRN: [id number] Date of Birth: Aug 20, 1960 Referring Provider: Ollen GrossFrank Aluisio MD.  Encounter Date: 02/25/2017      PT End of Session - 02/25/17 1505    Visit Number 3   Number of Visits 12   Date for PT Re-Evaluation 03/19/17   PT Start Time 1451   PT Stop Time 1534   PT Time Calculation (min) 43 min      Past Medical History:  Diagnosis Date  . Arthritis   . GERD (gastroesophageal reflux disease)   . History of kidney stones   . Kidney stones   . Migraine   . Restless leg syndrome   . Swelling of lower limb    bialteral lower leg swelling    Past Surgical History:  Procedure Laterality Date  . MULTIPLE TOOTH EXTRACTIONS    . right eye lid surgery     . TOTAL KNEE ARTHROPLASTY Left 01/26/2017   Procedure: LEFT TOTAL KNEE ARTHROPLASTY;  Surgeon: Ollen GrossAluisio, Frank, MD;  [facility];  Service: Orthopedics;  Laterality: Left;  with block  . TUBAL LIGATION      There were no vitals filed for this visit.      Subjective Assessment - 02/25/17 1457    Subjective Patient reported ongoing pain and stiffness   Pertinent History Left LE fracture as a teenager.   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   Currently in Pain? Yes   Pain Score 6    Pain Location Knee   Pain Orientation Left   Pain Descriptors / Indicators Aching   Pain Type Surgical pain   Pain Onset 1 to 4 weeks ago   Pain Frequency Constant   Aggravating Factors  prolong activity and ROM   Pain Relieving Factors at rest            [facility] - 02/25/17 0001      ROM / Strength   AROM / PROM / Strength AROM;PROM     AROM   AROM Assessment Site Knee   Right/Left Knee Left   Left Knee Extension -5   Left Knee Flexion 105     PROM   PROM Assessment Site Knee   Right/Left Knee Left   Left Knee Extension -2   Left Knee Flexion 110                     [facility] Adult PT Treatment/Exercise - 02/25/17 0001      Knee/Hip Exercises: Aerobic   Nustep L4 x2215min UE/LE activity     Knee/Hip Exercises: Supine   Straight Leg Raises Strengthening;Left;2 sets;10 reps     Knee/Hip Exercises: Sidelying   Hip ABduction Strengthening;Left;10 reps;1 set     Electrical Stimulation   Electrical Stimulation Location Left knee.   Electrical Stimulation Action IFC   Electrical Stimulation Parameters 1-10hz  x7715min     Vasopneumatic   Number Minutes Vasopneumatic  15 minutes   Vasopnuematic Location  Knee   Vasopneumatic Pressure Medium     Manual Therapy   Manual Therapy Passive ROM   Passive ROM manual PROM to left knee for flexion and ext with low load holds                  PT Short Term Goals - 02/25/17 1505  PT SHORT TERM GOAL #1   Title Independent with an initial HEP.   Time 2   Period Weeks   Status On-going     PT SHORT TERM GOAL #2   Title Full active left knee extension in order to normalize gait   Time 2   Period Weeks   Status On-going           PT Long Term Goals - 02/25/17 1505      PT LONG TERM GOAL #1   Title Independent with an advanced HEP.   Time 4   Period Weeks   Status On-going     PT LONG TERM GOAL #2   Title Active left knee flexion to 120-125 degrees+ so the patient can perform functional tasks and do so with pain not > 2-3/10.   Time 4   Period Weeks   Status On-going     PT LONG TERM GOAL #3   Title Increase left knee strength to a solid 5/5 to provide good stability for accomplishment of functional activities   Time 4   Period Weeks   Status On-going     PT LONG TERM GOAL #4   Title Perform a reciprocating stair gait with one railing with pain not > 2-3/10.   Time 4   Period Weeks   Status On-going               Plan - 02/25/17 1521    Clinical Impression  Statement Patient tolerated treatment well today. Patient has improved with left knee ROM for flexion and ext today. Patient had difficulty with SLR and hip abd exercises due to fatigue/weakness. Patient progressing toward goals yet unable to meet due to pain, strength and full ROM limitations.    Rehab Potential Excellent   PT Frequency 3x / week   PT Duration 4 weeks   PT Treatment/Interventions ADLs/Self Care Home Management;Cryotherapy;Electrical Stimulation;Therapeutic activities;Therapeutic exercise;Patient/family education;Passive range of motion;Manual techniques;Vasopneumatic Device   PT Next Visit Plan Total knee protocol.  e'stim and vasopneumatic.   Consulted and Agree with Plan of Care Patient      Patient will benefit from skilled therapeutic intervention in order to improve the following deficits and impairments:  Abnormal gait, Decreased activity tolerance, Decreased range of motion, Decreased strength, Increased edema, Pain  Visit Diagnosis: Chronic pain of left knee  Stiffness of left knee, not elsewhere classified  Localized edema     Problem List Patient Active Problem List   Diagnosis Date Noted  . OA (osteoarthritis) of knee 01/26/2017  . Nephrolithiasis 11/15/2013  . Chest pain 10/22/2012  . Migraine 10/22/2012  . Numbness and tingling in hands 10/22/2012    ,  P, PTA 02/25/2017, 3:35 PM  [facility] [address] [place], [place], [address] Phone: [phone]   Fax:  [phone]  Name: [name] PassyGinger S [name] MRN: [id number] Date of Birth: 1961/01/15

## 2017-02-27 ENCOUNTER — Encounter: Payer: 59 | Admitting: Physical Therapy

## 2017-03-02 ENCOUNTER — Encounter: Payer: Self-pay | Admitting: Physical Therapy

## 2017-03-02 ENCOUNTER — Ambulatory Visit: Payer: Medicare Other | Admitting: Physical Therapy

## 2017-03-02 DIAGNOSIS — R600 Localized edema: Secondary | ICD-10-CM

## 2017-03-02 DIAGNOSIS — M25562 Pain in left knee: Principal | ICD-10-CM

## 2017-03-02 DIAGNOSIS — M25662 Stiffness of left knee, not elsewhere classified: Secondary | ICD-10-CM

## 2017-03-02 DIAGNOSIS — G8929 Other chronic pain: Secondary | ICD-10-CM

## 2017-03-02 NOTE — Therapy (Signed)
[facility] [place], [place], [address] Phone: [phone]   Fax:  [phone]  Physical Therapy Treatment  Patient Details  Name: [name] [name] MRN: [id number] Date of Birth: 1960-12-26 Referring Provider: Gaynelle Arabian MD.  Encounter Date: 03/02/2017      PT End of Session - 03/02/17 1417    Visit Number 4   Number of Visits 12   Date for PT Re-Evaluation 03/19/17   PT Start Time 1344   PT Stop Time 1429   PT Time Calculation (min) 45 min   Activity Tolerance Patient tolerated treatment well   Behavior During Therapy [facility] for tasks assessed/performed      Past Medical History:  Diagnosis Date  . Arthritis   . GERD (gastroesophageal reflux disease)   . History of kidney stones   . Kidney stones   . Migraine   . Restless leg syndrome   . Swelling of lower limb    bialteral lower leg swelling    Past Surgical History:  Procedure Laterality Date  . MULTIPLE TOOTH EXTRACTIONS    . right eye lid surgery     . TOTAL KNEE ARTHROPLASTY Left 01/26/2017   Procedure: LEFT TOTAL KNEE ARTHROPLASTY;  Surgeon: Gaynelle Arabian, MD;  [facility];  Service: Orthopedics;  Laterality: Left;  with block  . TUBAL LIGATION      There were no vitals filed for this visit.      Subjective Assessment - 03/02/17 1348    Subjective Patient reported ongoing pain and stiffness and did good after last treatment   Pertinent History Left LE fracture as a teenager.   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   Currently in Pain? Yes   Pain Score 5    Pain Location Knee   Pain Orientation Left   Pain Descriptors / Indicators Aching   Pain Type Surgical pain   Pain Onset 1 to 4 weeks ago   Pain Frequency Constant   Aggravating Factors  prolong activity   Pain Relieving Factors at rest            [facility] PT Assessment - 03/02/17 0001      AROM   AROM Assessment Site Knee   Right/Left Knee Left   Left  Knee Extension -3   Left Knee Flexion 110     PROM   PROM Assessment Site Knee   Right/Left Knee Left   Left Knee Extension 0   Left Knee Flexion 115                     [facility] Adult PT Treatment/Exercise - 03/02/17 0001      Knee/Hip Exercises: Aerobic   Nustep L4 x80mn LE only     Knee/Hip Exercises: Standing   Forward Step Up Left;2 sets;10 reps;Step Height: 6"   Rocker Board 2 minutes     Knee/Hip Exercises: Seated   Long Arc Quad Strengthening;Left;3 sets;10 reps   Long Arc Quad Weight 3 lbs.     EAcupuncturistLocation Left knee.   EChartered certified accountantIFC   Electrical Stimulation Parameters 1-10hz x123m   Electrical Stimulation Goals Edema;Pain     Vasopneumatic   Number Minutes Vasopneumatic  15 minutes   Vasopnuematic Location  Knee   Vasopneumatic Pressure Medium     Manual Therapy   Manual Therapy Passive ROM   Passive ROM manual PROM to left knee for flexion and ext  with low load holds                  PT Short Term Goals - 03/02/17 1418      PT SHORT TERM GOAL #1   Title Independent with an initial HEP.   Time 2   Period Weeks   Status Achieved     PT SHORT TERM GOAL #2   Title Full active left knee extension in order to normalize gait   Time 2   Period Weeks   Status On-going           PT Long Term Goals - 02/25/17 1505      PT LONG TERM GOAL #1   Title Independent with an advanced HEP.   Time 4   Period Weeks   Status On-going     PT LONG TERM GOAL #2   Title Active left knee flexion to 120-125 degrees+ so the patient can perform functional tasks and do so with pain not > 2-3/10.   Time 4   Period Weeks   Status On-going     PT LONG TERM GOAL #3   Title Increase left knee strength to a solid 5/5 to provide good stability for accomplishment of functional activities   Time 4   Period Weeks   Status On-going     PT LONG TERM GOAL #4   Title Perform a  reciprocating stair gait with one railing with pain not > 2-3/10.   Time 4   Period Weeks   Status On-going               Plan - 03/02/17 1419    Clinical Impression Statement Patient tolerated treatment well today. Patient progressing overall with improved ROM in left knee for flexion and ext. Patient also progressing with left knee strengthening. Patient reported doing initail HEP as instructed daily with other exercises. Patient has improved overall and progressing toward goals. Patient met STG #1 today and other goals ongoing due to full acive ROM, strength and pain deficts.    Rehab Potential Excellent   PT Frequency 3x / week   PT Duration 4 weeks   PT Treatment/Interventions ADLs/Self Care Home Management;Cryotherapy;Electrical Stimulation;Therapeutic activities;Therapeutic exercise;Patient/family education;Passive range of motion;Manual techniques;Vasopneumatic Device   PT Next Visit Plan cont with POC for left knee ROM, strengthening and modalities PRN (MD. Maureen Ralphs 03/03/17)   Consulted and Agree with Plan of Care Patient      Patient will benefit from skilled therapeutic intervention in order to improve the following deficits and impairments:  Abnormal gait, Decreased activity tolerance, Decreased range of motion, Decreased strength, Increased edema, Pain  Visit Diagnosis: Chronic pain of left knee  Stiffness of left knee, not elsewhere classified  Localized edema     Problem List Patient Active Problem List   Diagnosis Date Noted  . OA (osteoarthritis) of knee 01/26/2017  . Nephrolithiasis 11/15/2013  . Chest pain 10/22/2012  . Migraine 10/22/2012  . Numbness and tingling in hands 10/22/2012    Ladean Raya, PTA 03/02/17 3:02 PM [place] MPT [facility] [place], [place], [address] Phone: [phone]   Fax:  [phone]  Name: [name] [name] MRN: [id number] Date of Birth:  [birth year]

## 2017-03-04 ENCOUNTER — Ambulatory Visit: Payer: Medicare Other | Attending: Orthopedic Surgery

## 2017-03-04 DIAGNOSIS — G8929 Other chronic pain: Secondary | ICD-10-CM | POA: Insufficient documentation

## 2017-03-04 DIAGNOSIS — M25562 Pain in left knee: Secondary | ICD-10-CM | POA: Insufficient documentation

## 2017-03-04 DIAGNOSIS — M25662 Stiffness of left knee, not elsewhere classified: Secondary | ICD-10-CM | POA: Insufficient documentation

## 2017-03-04 DIAGNOSIS — R600 Localized edema: Secondary | ICD-10-CM | POA: Insufficient documentation

## 2017-03-06 ENCOUNTER — Ambulatory Visit: Payer: Medicare Other | Admitting: Physical Therapy

## 2017-03-10 ENCOUNTER — Ambulatory Visit: Payer: Medicare Other | Admitting: *Deleted

## 2017-03-10 DIAGNOSIS — R600 Localized edema: Secondary | ICD-10-CM | POA: Diagnosis present

## 2017-03-10 DIAGNOSIS — M25562 Pain in left knee: Principal | ICD-10-CM

## 2017-03-10 DIAGNOSIS — M25662 Stiffness of left knee, not elsewhere classified: Secondary | ICD-10-CM

## 2017-03-10 DIAGNOSIS — G8929 Other chronic pain: Secondary | ICD-10-CM

## 2017-03-10 NOTE — Therapy (Signed)
[facility] [place], [place], [address] Phone: [phone]   Fax:  [phone]  Physical Therapy Treatment  Patient Details  Name: [name] [name] MRN: [id number] Date of Birth: 1961/03/29 Referring Provider: Gaynelle Arabian MD.  Encounter Date: 03/10/2017      PT End of Session - 03/10/17 1743    Visit Number 5   Number of Visits 12   Date for PT Re-Evaluation 03/19/17   PT Start Time 3403   PT Stop Time 5248   PT Time Calculation (min) 55 min      Past Medical History:  Diagnosis Date  . Arthritis   . GERD (gastroesophageal reflux disease)   . History of kidney stones   . Kidney stones   . Migraine   . Restless leg syndrome   . Swelling of lower limb    bialteral lower leg swelling    Past Surgical History:  Procedure Laterality Date  . MULTIPLE TOOTH EXTRACTIONS    . right eye lid surgery     . TOTAL KNEE ARTHROPLASTY Left 01/26/2017   Procedure: LEFT TOTAL KNEE ARTHROPLASTY;  Surgeon: Gaynelle Arabian, MD;  Location: [facility];  Service: Orthopedics;  Laterality: Left;  with block  . TUBAL LIGATION      There were no vitals filed for this visit.      Subjective Assessment - 03/10/17 1645    Subjective Patient reported ongoing pain and stiffness and did good after last treatment   Pertinent History Left LE fracture as a teenager.   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   Currently in Pain? Yes   Pain Score 5    Pain Location Knee   Pain Orientation Left   Pain Descriptors / Indicators Aching   Pain Type Surgical pain   Pain Onset 1 to 4 weeks ago   Pain Frequency Constant                         [facility] Adult PT Treatment/Exercise - 03/10/17 0001      Knee/Hip Exercises: Aerobic   Nustep L5 x15 min LE only     Knee/Hip Exercises: Standing   Forward Step Up Left;2 sets;10 reps;Step Height: 8"   Rocker Board 3 minutes     Knee/Hip Exercises: Seated   Long Arc  Quad Strengthening;Left;3 sets;10 reps   Long Arc Quad Weight 3 lbs.     Acupuncturist Location Left knee.  IFC x 15 mins 1-10hz    Electrical Stimulation Goals Edema;Pain     Vasopneumatic   Number Minutes Vasopneumatic  15 minutes   Vasopnuematic Location  Knee   Vasopneumatic Pressure Medium   Vasopneumatic Temperature  36     Manual Therapy   Manual Therapy Passive ROM   Passive ROM manual PROM to left knee for flexion .Marland Kitchen ROM to 122 degrees today                  PT Short Term Goals - 03/02/17 1418      PT SHORT TERM GOAL #1   Title Independent with an initial HEP.   Time 2   Period Weeks   Status Achieved     PT SHORT TERM GOAL #2   Title Full active left knee extension in order to normalize gait   Time 2   Period Weeks   Status On-going  PT Long Term Goals - 02/25/17 1505      PT LONG TERM GOAL #1   Title Independent with an advanced HEP.   Time 4   Period Weeks   Status On-going     PT LONG TERM GOAL #2   Title Active left knee flexion to 120-125 degrees+ so the patient can perform functional tasks and do so with pain not > 2-3/10.   Time 4   Period Weeks   Status On-going     PT LONG TERM GOAL #3   Title Increase left knee strength to a solid 5/5 to provide good stability for accomplishment of functional activities   Time 4   Period Weeks   Status On-going     PT LONG TERM GOAL #4   Title Perform a reciprocating stair gait with one railing with pain not > 2-3/10.   Time 4   Period Weeks   Status On-going               Plan - 03/10/17 1736    Clinical Impression Statement Pt arrived to clinic doing fairly well with minimal LT knee pain. Rx focused on ROM, strength ,and balance for RT LE. She was able to reach 122 degrees of flexion today. Normal modality response.   Clinical Presentation Stable   Clinical Decision Making Low   Rehab Potential Excellent   PT Frequency 3x / week    PT Treatment/Interventions ADLs/Self Care Home Management;Cryotherapy;Electrical Stimulation;Therapeutic activities;Therapeutic exercise;Patient/family education;Passive range of motion;Manual techniques;Vasopneumatic Device   PT Next Visit Plan cont with POC for left knee ROM, strengthening and modalities PRN (MD. Maureen Ralphs 03/03/17)   Consulted and Agree with Plan of Care Patient      Patient will benefit from skilled therapeutic intervention in order to improve the following deficits and impairments:  Abnormal gait, Decreased activity tolerance, Decreased range of motion, Decreased strength, Increased edema, Pain  Visit Diagnosis: Chronic pain of left knee  Stiffness of left knee, not elsewhere classified  Localized edema     Problem List Patient Active Problem List   Diagnosis Date Noted  . OA (osteoarthritis) of knee 01/26/2017  . Nephrolithiasis 11/15/2013  . Chest pain 10/22/2012  . Migraine 10/22/2012  . Numbness and tingling in hands 10/22/2012    RAMSEUR,CHRIS, PTA 03/10/2017, 5:44 PM  [facility] [address] [place], [place], [address] Phone: [phone]   Fax:  [phone]  Name: [name] [name] MRN: [id number] Date of Birth: 08/20/1960

## 2017-03-12 ENCOUNTER — Encounter: Payer: Self-pay | Admitting: Physical Therapy

## 2017-03-12 ENCOUNTER — Ambulatory Visit: Payer: Medicare Other | Admitting: Physical Therapy

## 2017-03-12 DIAGNOSIS — M25562 Pain in left knee: Secondary | ICD-10-CM | POA: Diagnosis not present

## 2017-03-12 DIAGNOSIS — R600 Localized edema: Secondary | ICD-10-CM

## 2017-03-12 DIAGNOSIS — M25662 Stiffness of left knee, not elsewhere classified: Secondary | ICD-10-CM

## 2017-03-12 DIAGNOSIS — G8929 Other chronic pain: Secondary | ICD-10-CM

## 2017-03-12 NOTE — Therapy (Signed)
[facility] [address] [place], [place], [address] Phone: [phone]   Fax:  [phone]  Physical Therapy Treatment  Patient Details  Name: [name] [name] MRN: [id number] Date of Birth: Mar 20, 1961 Referring Provider: Ollen Gross MD.  Encounter Date: 03/12/2017      PT End of Session - 03/12/17 1657    Visit Number 6   Number of Visits 12   Date for PT Re-Evaluation 03/19/17   PT Start Time 1647   PT Stop Time 1741   PT Time Calculation (min) 54 min   Activity Tolerance Patient tolerated treatment well   Behavior During Therapy [facility] for tasks assessed/performed      Past Medical History:  Diagnosis Date  . Arthritis   . GERD (gastroesophageal reflux disease)   . History of kidney stones   . Kidney stones   . Migraine   . Restless leg syndrome   . Swelling of lower limb    bialteral lower leg swelling    Past Surgical History:  Procedure Laterality Date  . MULTIPLE TOOTH EXTRACTIONS    . right eye lid surgery     . TOTAL KNEE ARTHROPLASTY Left 01/26/2017   Procedure: LEFT TOTAL KNEE ARTHROPLASTY;  Surgeon: Ollen Gross, MD;  Location: [facility];  Service: Orthopedics;  Laterality: Left;  with block  . TUBAL LIGATION      There were no vitals filed for this visit.      Subjective Assessment - 03/12/17 1648    Subjective Reports that she was going with her dad today to get on trucks and states that she may have aggravated posterior L knee.   Pertinent History Left LE fracture as a teenager.   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   Currently in Pain? Yes   Pain Score --  No pain score provided by patient   Pain Location Knee   Pain Orientation Left;Posterior   Pain Descriptors / Indicators Discomfort   Pain Type Surgical pain   Pain Onset 1 to 4 weeks ago            [facility] PT Assessment - 03/12/17 0001      Assessment   Medical Diagnosis Left total knee replacement.   Onset  Date/Surgical Date 01/26/17   Next MD Visit 04/2017     Restrictions   Weight Bearing Restrictions No                     [facility] Adult PT Treatment/Exercise - 03/12/17 0001      Knee/Hip Exercises: Aerobic   Nustep L5 x11 min     Knee/Hip Exercises: Standing   Hip Abduction AROM;Left;2 sets;10 reps;Knee straight   Forward Step Up Left;2 sets;10 reps;Step Height: 8"   Forward Step Up Limitations 1st set by 6" step, 8" step x10 reps but pull in posterior knee   Rocker Board 3 minutes     Knee/Hip Exercises: Seated   Long Arc Quad Strengthening;Left;3 sets;10 reps   Long Arc Quad Weight 4 lbs.     Modalities   Modalities Proofreader Location L knee   Electrical Stimulation Action IFC   Electrical Stimulation Parameters 1-10 hz x15 min   Electrical Stimulation Goals Edema;Pain     Vasopneumatic   Number Minutes Vasopneumatic  15 minutes   Vasopnuematic Location  Knee   Vasopneumatic Pressure Medium   Vasopneumatic Temperature  34  Manual Therapy   Manual Therapy Passive ROM   Passive ROM PROM of L knee into flexion with holds at end range; Passive L HS stretch 3x30 sec                  PT Short Term Goals - 03/02/17 1418      PT SHORT TERM GOAL #1   Title Independent with an initial HEP.   Time 2   Period Weeks   Status Achieved     PT SHORT TERM GOAL #2   Title Full active left knee extension in order to normalize gait   Time 2   Period Weeks   Status On-going           PT Long Term Goals - 02/25/17 1505      PT LONG TERM GOAL #1   Title Independent with an advanced HEP.   Time 4   Period Weeks   Status On-going     PT LONG TERM GOAL #2   Title Active left knee flexion to 120-125 degrees+ so the patient can perform functional tasks and do so with pain not > 2-3/10.   Time 4   Period Weeks   Status On-going     PT LONG TERM GOAL #3   Title  Increase left knee strength to a solid 5/5 to provide good stability for accomplishment of functional activities   Time 4   Period Weeks   Status On-going     PT LONG TERM GOAL #4   Title Perform a reciprocating stair gait with one railing with pain not > 2-3/10.   Time 4   Period Weeks   Status On-going               Plan - 03/12/17 1744    Clinical Impression Statement Patient tolerated today's treatment well although she complained of posterior L knee pain. Patient continues to experience difficulty with forward step ups especially with 8" step. No abnormal tightness palpated in L HS and smooth arc of motion noted with PROM of L knee into flexion. AROM L knee measured as 0-115 deg today. Normal modalities response noted following removal of the modalities.   Rehab Potential Excellent   PT Frequency 3x / week   PT Duration 4 weeks   PT Treatment/Interventions ADLs/Self Care Home Management;Cryotherapy;Electrical Stimulation;Therapeutic activities;Therapeutic exercise;Patient/family education;Passive range of motion;Manual techniques;Vasopneumatic Device   PT Next Visit Plan cont with POC for left knee ROM, strengthening and modalities PRN    Consulted and Agree with Plan of Care Patient      Patient will benefit from skilled therapeutic intervention in order to improve the following deficits and impairments:  Abnormal gait, Decreased activity tolerance, Decreased range of motion, Decreased strength, Increased edema, Pain  Visit Diagnosis: Chronic pain of left knee  Stiffness of left knee, not elsewhere classified  Localized edema     Problem List Patient Active Problem List   Diagnosis Date Noted  . OA (osteoarthritis) of knee 01/26/2017  . Nephrolithiasis 11/15/2013  . Chest pain 10/22/2012  . Migraine 10/22/2012  . Numbness and tingling in hands 10/22/2012    Evelene CroonKelsey M Parsons, PTA 03/12/2017, 5:51 PM  [facility] [address] [place], [place], [address] Phone: [phone]   Fax:  [phone]  Name: [name] [name] MRN: [id number] Date of Birth: 21-Jan-1961

## 2017-03-16 ENCOUNTER — Encounter: Payer: Self-pay | Admitting: Physical Therapy

## 2017-03-16 ENCOUNTER — Ambulatory Visit: Payer: Medicare Other | Admitting: Physical Therapy

## 2017-03-16 DIAGNOSIS — M25562 Pain in left knee: Principal | ICD-10-CM

## 2017-03-16 DIAGNOSIS — G8929 Other chronic pain: Secondary | ICD-10-CM

## 2017-03-16 DIAGNOSIS — M25662 Stiffness of left knee, not elsewhere classified: Secondary | ICD-10-CM

## 2017-03-16 DIAGNOSIS — R600 Localized edema: Secondary | ICD-10-CM

## 2017-03-16 NOTE — Therapy (Signed)
[facility] [address] [place], [place], [address] Phone: [phone]   Fax:  [phone]  Physical Therapy Treatment  Patient Details  Name: [name] [name] MRN: [id number] Date of Birth: [birth year] Referring Provider: Ollen GrossFrank Aluisio MD.  Encounter Date: 03/16/2017    Past Medical History:  Diagnosis Date  . Arthritis   . GERD (gastroesophageal reflux disease)   . History of kidney stones   . Kidney stones   . Migraine   . Restless leg syndrome   . Swelling of lower limb    bialteral lower leg swelling    Past Surgical History:  Procedure Laterality Date  . MULTIPLE TOOTH EXTRACTIONS    . right eye lid surgery     . TOTAL KNEE ARTHROPLASTY Left 01/26/2017   Procedure: LEFT TOTAL KNEE ARTHROPLASTY;  Surgeon: Ollen GrossAluisio, Frank, MD;  Location: [facility];  Service: Orthopedics;  Laterality: Left;  with block  . TUBAL LIGATION      There were no vitals filed for this visit.                                 PT Short Term Goals - 03/16/17 1310      PT SHORT TERM GOAL #1   Title Independent with an initial HEP.   Time 2   Period Weeks   Status Achieved     PT SHORT TERM GOAL #2   Title Full active left knee extension in order to normalize gait   Time 2   Period Weeks   Status On-going           PT Long Term Goals - 03/16/17 1521      PT LONG TERM GOAL #1   Title Independent with an advanced HEP.   Time 4   Period Weeks   Status On-going     PT LONG TERM GOAL #2   Title Active left knee flexion to 120-125 degrees+ so the patient can perform functional tasks and do so with pain not > 2-3/10.   Time 4   Period Weeks   Status On-going     PT LONG TERM GOAL #3   Title Increase left knee strength to a solid 5/5 to provide good stability for accomplishment of functional activities   Time 4   Period Weeks   Status On-going     PT LONG TERM GOAL #4   Title Perform a reciprocating stair  gait with one railing with pain not > 2-3/10.   Time 4   Period Weeks               Plan - 03/19/17 1519    Clinical Impression Statement Patient tolerated treatment today reporting 6/10 pain at beginning of therapy and 2/10 pain at end of session. pt making progress with L knee ROM still progressing with strength and functional mobility. Continue with skilled PT.    Rehab Potential Excellent   PT Frequency 3x / week   PT Duration 4 weeks   PT Treatment/Interventions ADLs/Self Care Home Management;Cryotherapy;Electrical Stimulation;Therapeutic activities;Therapeutic exercise;Patient/family education;Passive range of motion;Manual techniques;Vasopneumatic Device   PT Next Visit Plan cont with POC for left knee ROM, strengthening and modalities PRN    Consulted and Agree with Plan of Care Patient      Patient will benefit from skilled therapeutic intervention in order to improve the following deficits and impairments:  Abnormal gait, Decreased activity tolerance, Decreased range  of motion, Decreased strength, Increased edema, Pain  Visit Diagnosis: Chronic pain of left knee  Stiffness of left knee, not elsewhere classified  Localized edema     Problem List Patient Active Problem List   Diagnosis Date Noted  . OA (osteoarthritis) of knee 01/26/2017  . Nephrolithiasis 11/15/2013  . Chest pain 10/22/2012  . Migraine 10/22/2012  . Numbness and tingling in hands 10/22/2012    Sharmon Leyden, MPT 03/19/2017, 3:21 PM  [facility] [address] [place], [place], [address] Phone: [phone]   Fax:  [phone]  Name: [name] [name] MRN: [id number] Date of Birth: 12/19/1960

## 2017-03-20 ENCOUNTER — Encounter: Payer: 59 | Admitting: Physical Therapy

## 2017-03-24 ENCOUNTER — Ambulatory Visit: Payer: Medicare Other | Admitting: Physical Therapy

## 2017-03-25 ENCOUNTER — Ambulatory Visit: Payer: Medicare Other

## 2017-03-25 DIAGNOSIS — G8929 Other chronic pain: Secondary | ICD-10-CM

## 2017-03-25 DIAGNOSIS — M25562 Pain in left knee: Principal | ICD-10-CM

## 2017-03-25 DIAGNOSIS — M25662 Stiffness of left knee, not elsewhere classified: Secondary | ICD-10-CM

## 2017-03-25 DIAGNOSIS — R600 Localized edema: Secondary | ICD-10-CM

## 2017-03-25 NOTE — Therapy (Signed)
[facility] [address] [place], [place], [address] Phone: [phone]   Fax:  [phone]  Physical Therapy Treatment  Patient Details  Name: [name] [name] MRN: [id number] Date of Birth: [birth year] Referring Provider: Ollen Gross MD.  Encounter Date: 03/25/2017      PT End of Session - 03/25/17 1451    Visit Number 8   Number of Visits 12   Date for PT Re-Evaluation 03/19/17   PT Start Time 1430   PT Stop Time 1535   PT Time Calculation (min) 65 min   Activity Tolerance Patient tolerated treatment well   Behavior During Therapy [facility] for tasks assessed/performed      Past Medical History:  Diagnosis Date  . Arthritis   . GERD (gastroesophageal reflux disease)   . History of kidney stones   . Kidney stones   . Migraine   . Restless leg syndrome   . Swelling of lower limb    bialteral lower leg swelling    Past Surgical History:  Procedure Laterality Date  . MULTIPLE TOOTH EXTRACTIONS    . right eye lid surgery     . TOTAL KNEE ARTHROPLASTY Left 01/26/2017   Procedure: LEFT TOTAL KNEE ARTHROPLASTY;  Surgeon: Ollen Gross, MD;  Location: [facility];  Service: Orthopedics;  Laterality: Left;  with block  . TUBAL LIGATION      There were no vitals filed for this visit.      Subjective Assessment - 03/25/17 1438    Subjective Pt. noting some discomfort in L knee due to being on hands and knees earlier today.     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   Currently in Pain? Yes   Pain Score 1    Pain Location Knee   Pain Orientation Left   Pain Descriptors / Indicators Discomfort   Pain Type Surgical pain   Pain Onset 1 to 4 weeks ago   Pain Frequency Constant   Aggravating Factors  prolonged activity   Multiple Pain Sites No                         [facility] Adult PT Treatment/Exercise - 03/25/17 1450      Knee/Hip Exercises: Stretches   Gastroc Stretch Left;2 reps;30 seconds   Gastroc Stretch Limitations leaning into wall      Knee/Hip Exercises: Aerobic   Stationary Bike Recumbent bike: lvl 2, 10 min      Knee/Hip Exercises: Machines for Strengthening   Cybex Knee Flexion B LE's 40# x 15 reps     Knee/Hip Exercises: Standing   Forward Step Up Left;Step Height: 8";1 set;15 reps   Forward Step Up Limitations Focusing on eccentric step down   SLS L SLS on airex pad 2 x 20 sec; light UE support    Other Standing Knee Exercises Alteranting lunge onto BOSU ball (up) x 10 rpes each side; 1 UE support    Other Standing Knee Exercises Standing toe clears with L LE to ~ 12" box x 15 reps; no UE support      Knee/Hip Exercises: Supine   Bridges Limitations 5" x 15 reps   Other Supine Knee/Hip Exercises L knee flexion stretch with heel on peanut p-ball and strap assist 5" x 10 reps     Vasopneumatic   Number Minutes Vasopneumatic  15 minutes   Vasopnuematic Location  Knee   Vasopneumatic Pressure Medium   Vasopneumatic Temperature  36  PT Short Term Goals - 03/16/17 1310      PT SHORT TERM GOAL #1   Title Independent with an initial HEP.   Time 2   Period Weeks   Status Achieved     PT SHORT TERM GOAL #2   Title Full active left knee extension in order to normalize gait   Time 2   Period Weeks   Status On-going           PT Long Term Goals - 03/16/17 1521      PT LONG TERM GOAL #1   Title Independent with an advanced HEP.   Time 4   Period Weeks   Status On-going     PT LONG TERM GOAL #2   Title Active left knee flexion to 120-125 degrees+ so the patient can perform functional tasks and do so with pain not > 2-3/10.   Time 4   Period Weeks   Status On-going     PT LONG TERM GOAL #3   Title Increase left knee strength to a solid 5/5 to provide good stability for accomplishment of functional activities   Time 4   Period Weeks   Status On-going     PT LONG TERM GOAL #4   Title Perform a reciprocating stair gait  with one railing with pain not > 2-3/10.   Time 4   Period Weeks               Plan - 03/25/17 1443    Clinical Impression Statement Kollins doing well today with only slight discomfort at L knee due to having to get down on knees outside to cover mower with tarp.  Tolerated all strengthening, proprioception, and SLS tasks well today.  Pt. requesting ice/compression to end treatment as she feels this decreases her post-exercise soreness.  Ice/compression applied to L knee to end treatment.   PT Treatment/Interventions ADLs/Self Care Home Management;Cryotherapy;Electrical Stimulation;Therapeutic activities;Therapeutic exercise;Patient/family education;Passive range of motion;Manual techniques;Vasopneumatic Device   PT Next Visit Plan cont with POC for left knee ROM, strengthening and modalities PRN       Patient will benefit from skilled therapeutic intervention in order to improve the following deficits and impairments:  Abnormal gait, Decreased activity tolerance, Decreased range of motion, Decreased strength, Increased edema, Pain  Visit Diagnosis: Chronic pain of left knee  Stiffness of left knee, not elsewhere classified  Localized edema     Problem List Patient Active Problem List   Diagnosis Date Noted  . OA (osteoarthritis) of knee 01/26/2017  . Nephrolithiasis 11/15/2013  . Chest pain 10/22/2012  . Migraine 10/22/2012  . Numbness and tingling in hands 10/22/2012    Kermit Balo, PTA 03/25/17 3:33 PM  [facility] [address] [place], [place], [address] Phone: [phone]   Fax:  [phone]  Name: CHANTALE NIGH MRN: [id number] Date of Birth: 02-Jun-1961

## 2017-03-26 ENCOUNTER — Encounter: Payer: 59 | Admitting: Physical Therapy

## 2017-03-31 ENCOUNTER — Ambulatory Visit: Payer: Medicare Other | Admitting: Physical Therapy

## 2017-03-31 ENCOUNTER — Encounter: Payer: Self-pay | Admitting: Physical Therapy

## 2017-03-31 DIAGNOSIS — R600 Localized edema: Secondary | ICD-10-CM

## 2017-03-31 DIAGNOSIS — G8929 Other chronic pain: Secondary | ICD-10-CM

## 2017-03-31 DIAGNOSIS — M25662 Stiffness of left knee, not elsewhere classified: Secondary | ICD-10-CM

## 2017-03-31 DIAGNOSIS — M25562 Pain in left knee: Secondary | ICD-10-CM | POA: Diagnosis not present

## 2017-03-31 NOTE — Therapy (Signed)
[facility] [address] [place], [place], [address] Phone: [phone]   Fax:  [phone]  Physical Therapy Treatment  Patient Details  Name: [name] [name] MRN: [id number] Date of Birth: [birth year] Referring Provider: Ollen Gross MD.  Encounter Date: 03/31/2017      PT End of Session - 03/31/17 1516    Visit Number 9   Number of Visits 12   Date for PT Re-Evaluation 03/19/17   PT Start Time 1519   PT Stop Time 1609   PT Time Calculation (min) 50 min   Activity Tolerance Patient tolerated treatment well   Behavior During Therapy [facility] for tasks assessed/performed      Past Medical History:  Diagnosis Date  . Arthritis   . GERD (gastroesophageal reflux disease)   . History of kidney stones   . Kidney stones   . Migraine   . Restless leg syndrome   . Swelling of lower limb    bialteral lower leg swelling    Past Surgical History:  Procedure Laterality Date  . MULTIPLE TOOTH EXTRACTIONS    . right eye lid surgery     . TOTAL KNEE ARTHROPLASTY Left 01/26/2017   Procedure: LEFT TOTAL KNEE ARTHROPLASTY;  Surgeon: Ollen Gross, MD;  Location: [facility];  Service: Orthopedics;  Laterality: Left;  with block  . TUBAL LIGATION      There were no vitals filed for this visit.      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   Pertinent History Left LE fracture as a teenager.   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   Currently in Pain? Yes   Pain Score 7    Pain Location Knee   Pain Orientation Left   Pain Descriptors / Indicators Discomfort   Pain Type Surgical pain   Pain Onset 1 to 4 weeks ago            [facility] PT Assessment - 03/31/17 0001      Assessment   Medical Diagnosis Left total knee replacement.   Onset Date/Surgical Date 01/26/17   Next MD Visit 04/2017     Restrictions   Weight Bearing Restrictions No                     [facility] Adult PT Treatment/Exercise - 03/31/17 0001      Ambulation/Gait   Stairs Yes   Stairs Assistance 6: Modified independent (Device/Increase time)   Stair Management Technique One rail Right;Alternating pattern;Forwards   Number of Stairs 4  x2 RT   Height of Stairs 7     Knee/Hip Exercises: Aerobic   Stationary Bike L1 x10 min, seat 4     Knee/Hip Exercises: Machines for Strengthening   Cybex Knee Extension 10# 3x10 reps   Cybex Knee Flexion 30# 3x10 reps     Knee/Hip Exercises: Standing   Terminal Knee Extension Limitations Pink XTS LLE x20 reps 5 sec hold   Hip Abduction AROM;Left;2 sets;10 reps;Knee straight     Modalities   Modalities Proofreader Location L knee   Electrical Stimulation Action IFC   Electrical Stimulation Parameters 1-10 hz x15 mi   Electrical Stimulation Goals Edema     Vasopneumatic   Number Minutes Vasopneumatic  15  minutes   Vasopnuematic Location  Knee   Vasopneumatic Pressure Medium   Vasopneumatic Temperature  34                  PT Short Term Goals - 03/16/17 1310      PT SHORT TERM GOAL #1   Title Independent with an initial HEP.   Time 2   Period Weeks   Status Achieved     PT SHORT TERM GOAL #2   Title Full active left knee extension in order to normalize gait   Time 2   Period Weeks   Status On-going           PT Long Term Goals - 03/16/17 1521      PT LONG TERM GOAL #1   Title Independent with an advanced HEP.   Time 4   Period Weeks   Status On-going     PT LONG TERM GOAL #2   Title Active left knee flexion to 120-125 degrees+ so the patient can perform functional tasks and do so with pain not > 2-3/10.   Time 4   Period Weeks   Status On-going     PT LONG TERM GOAL #3   Title Increase left knee strength to a  solid 5/5 to provide good stability for accomplishment of functional activities   Time 4   Period Weeks   Status On-going     PT LONG TERM GOAL #4   Title Perform a reciprocating stair gait with one railing with pain not > 2-3/10.   Time 4   Period Weeks               Plan - 03/31/17 1553    Clinical Impression Statement Patient tolerated today's treatment well although she reported fatigue and discomfort from prolonged time in truck yesterday. Patient able to complete exercises as directed with no pain complaints from patient especially with machine strengthening. TKE completed as well although patiemt able to complete proper contraction for exercise. Patient able to complete stairs with compensatory strategies in descending and ascending secondary to lack of control and strength. Normal modalities response noted following removal of the modalities.   Rehab Potential Excellent   PT Frequency 3x / week   PT Duration 4 weeks   PT Treatment/Interventions ADLs/Self Care Home Management;Cryotherapy;Electrical Stimulation;Therapeutic activities;Therapeutic exercise;Patient/family education;Passive range of motion;Manual techniques;Vasopneumatic Device   PT Next Visit Plan cont with POC for left knee ROM, strengthening and modalities PRN    Consulted and Agree with Plan of Care Patient      Patient will benefit from skilled therapeutic intervention in order to improve the following deficits and impairments:  Abnormal gait, Decreased activity tolerance, Decreased range of motion, Decreased strength, Increased edema, Pain  Visit Diagnosis: Chronic pain of left knee  Stiffness of left knee, not elsewhere classified  Localized edema     Problem List Patient Active Problem List   Diagnosis Date Noted  . OA (osteoarthritis) of knee 01/26/2017  . Nephrolithiasis 11/15/2013  . Chest pain 10/22/2012  . Migraine 10/22/2012  . Numbness and tingling in hands 10/22/2012    Evelene Croon, PTA 03/31/2017, 4:11 PM  [facility] [address] [place], [place], [address] Phone: [phone]   Fax:  [phone]  Name: KHALEA VENTURA MRN: [id number] Date of Birth: [birth year]

## 2017-03-31 NOTE — Addendum Note (Signed)
Addended by: , [place] W on: 03/31/2017 05:32 PM   Modules accepted: Orders

## 2017-04-02 ENCOUNTER — Ambulatory Visit: Payer: Medicare Other | Admitting: Physical Therapy

## 2017-04-02 ENCOUNTER — Encounter: Payer: Self-pay | Admitting: Physical Therapy

## 2017-04-02 DIAGNOSIS — R600 Localized edema: Secondary | ICD-10-CM

## 2017-04-02 DIAGNOSIS — G8929 Other chronic pain: Secondary | ICD-10-CM

## 2017-04-02 DIAGNOSIS — M25562 Pain in left knee: Secondary | ICD-10-CM | POA: Diagnosis not present

## 2017-04-02 DIAGNOSIS — M25662 Stiffness of left knee, not elsewhere classified: Secondary | ICD-10-CM

## 2017-04-02 NOTE — Therapy (Signed)
[facility] [place], [place], [address] Phone: [phone]   Fax:  [phone]  Physical Therapy Treatment  Patient Details  Name: [name] [name] MRN: [id number] Date of Birth: [birth year] Referring Provider: Gaynelle Arabian MD.  Encounter Date: 04/02/2017      PT End of Session - 04/02/17 1508    Visit Number 10   Number of Visits 12   Date for PT Re-Evaluation 04/09/17   PT Start Time 1502   PT Stop Time 1542   PT Time Calculation (min) 40 min   Activity Tolerance Patient tolerated treatment well   Behavior During Therapy [facility] for tasks assessed/performed      Past Medical History:  Diagnosis Date  . Arthritis   . GERD (gastroesophageal reflux disease)   . History of kidney stones   . Kidney stones   . Migraine   . Restless leg syndrome   . Swelling of lower limb    bialteral lower leg swelling    Past Surgical History:  Procedure Laterality Date  . MULTIPLE TOOTH EXTRACTIONS    . right eye lid surgery     . TOTAL KNEE ARTHROPLASTY Left 01/26/2017   Procedure: LEFT TOTAL KNEE ARTHROPLASTY;  Surgeon: Gaynelle Arabian, MD;  Location: [facility];  Service: Orthopedics;  Laterality: Left;  with block  . TUBAL LIGATION      There were no vitals filed for this visit.      Subjective Assessment - 04/02/17 1503    Subjective Patient reported some increased soreness, may have done too much   Pertinent History Left LE fracture as a teenager.   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   Currently in Pain? Yes   Pain Score 7    Pain Location Knee   Pain Orientation Left   Pain Descriptors / Indicators Discomfort   Pain Type Surgical pain   Pain Onset More than a month ago   Pain Frequency Constant   Aggravating Factors  increased activity   Pain Relieving Factors rest            [facility] PT Assessment - 04/02/17 0001      AROM   AROM Assessment Site Knee   Right/Left Knee Left   Left Knee  Extension 0   Left Knee Flexion 118                     [facility] Adult PT Treatment/Exercise - 04/02/17 0001      Knee/Hip Exercises: Stretches   Knee: Self-Stretch to increase Flexion Left;3 reps;30 seconds     Knee/Hip Exercises: Aerobic   Nustep x68mn L5     Knee/Hip Exercises: Standing   Terminal Knee Extension Limitations green t-band x20   Forward Step Up Left;Step Height: 8";10 reps;2 sets     Knee/Hip Exercises: Seated   Long Arc Quad Strengthening;Left;3 sets;15 reps   Long Arc Quad Weight 4 lbs.     ETheme park managerL knee   EChartered certified accountantIFC   Electrical Stimulation Parameters 1-'10hz'  x178m   Electrical Stimulation Goals Edema     Vasopneumatic   Number Minutes Vasopneumatic  15 minutes   Vasopnuematic Location  Knee   Vasopneumatic Pressure Medium                  PT Short Term Goals - 04/02/17 1533      PT SHORT TERM GOAL #1   Title  Independent with an initial HEP.   Time 2   Period Weeks   Status Achieved     PT SHORT TERM GOAL #2   Title Full active left knee extension in order to normalize gait   Time 2   Period Weeks   Status Achieved  full ext 04/02/17           PT Long Term Goals - 04/02/17 1534      PT LONG TERM GOAL #1   Title Independent with an advanced HEP.   Time 4   Period Weeks   Status On-going     PT LONG TERM GOAL #2   Title Active left knee flexion to 120-125 degrees+ so the patient can perform functional tasks and do so with pain not > 2-3/10.   Time 4   Period Weeks   Status On-going  AROM 118 degrees 04/02/17     PT LONG TERM GOAL #3   Title Increase left knee strength to a solid 5/5 to provide good stability for accomplishment of functional activities   Time 4   Period Weeks   Status On-going     PT LONG TERM GOAL #4   Title Perform a reciprocating stair gait with one railing with pain not > 2-3/10.   Time 4   Period Weeks   Status  On-going               Plan - 04/02/17 1531    Clinical Impression Statement Patient tolerated treatment well today. Patient has some difficulty with exercises due to right knee pain. Patient progressing with strengthening exercises and ROM in left knee today. FOTO 51% limitation (initial 53%). Patient met STG #2 and other LTG's ongoing due to strength, pain and ROM deficts.    Rehab Potential Excellent   PT Frequency 3x / week   PT Duration 4 weeks   PT Treatment/Interventions ADLs/Self Care Home Management;Cryotherapy;Electrical Stimulation;Therapeutic activities;Therapeutic exercise;Patient/family education;Passive range of motion;Manual techniques;Vasopneumatic Device   PT Next Visit Plan cont with POC for left knee ROM, strengthening and modalities PRN (MD 04/14/17)   Consulted and Agree with Plan of Care Patient      Patient will benefit from skilled therapeutic intervention in order to improve the following deficits and impairments:  Abnormal gait, Decreased activity tolerance, Decreased range of motion, Decreased strength, Increased edema, Pain  Visit Diagnosis: Chronic pain of left knee  Stiffness of left knee, not elsewhere classified  Localized edema     Problem List Patient Active Problem List   Diagnosis Date Noted  . OA (osteoarthritis) of knee 01/26/2017  . Nephrolithiasis 11/15/2013  . Chest pain 10/22/2012  . Migraine 10/22/2012  . Numbness and tingling in hands 10/22/2012    Ladean Raya, PTA 04/02/17 3:43 PM  [facility] [address] [place], [place], [address] Phone: [phone]   Fax:  [phone]  Name: [name] [name] MRN: [id number] Date of Birth: 07/12/1961

## 2017-04-03 DIAGNOSIS — M25562 Pain in left knee: Secondary | ICD-10-CM | POA: Diagnosis not present

## 2017-04-13 ENCOUNTER — Encounter: Payer: 59 | Admitting: Physical Therapy

## 2017-04-15 ENCOUNTER — Encounter: Payer: Self-pay | Admitting: Physical Therapy

## 2017-04-15 ENCOUNTER — Ambulatory Visit: Payer: Medicare Other | Attending: Orthopedic Surgery | Admitting: Physical Therapy

## 2017-04-15 DIAGNOSIS — R600 Localized edema: Secondary | ICD-10-CM | POA: Diagnosis present

## 2017-04-15 DIAGNOSIS — G8929 Other chronic pain: Secondary | ICD-10-CM | POA: Diagnosis present

## 2017-04-15 DIAGNOSIS — M25662 Stiffness of left knee, not elsewhere classified: Secondary | ICD-10-CM | POA: Diagnosis present

## 2017-04-15 DIAGNOSIS — M25562 Pain in left knee: Secondary | ICD-10-CM | POA: Insufficient documentation

## 2017-04-15 NOTE — Therapy (Addendum)
[facility] [place], [place], [address] Phone: [phone]   Fax:  [phone]  Physical Therapy Treatment  Patient Details  Name: [name] [name] MRN: [id number] Date of Birth: 08/19/1960 Referring Provider: Gaynelle Arabian MD.  Encounter Date: 04/15/2017      PT End of Session - 04/15/17 1457    Visit Number 11   Number of Visits 12   Date for PT Re-Evaluation 04/09/17   PT Start Time 5056   PT Stop Time 1513   PT Time Calculation (min) 42 min   Activity Tolerance Patient tolerated treatment well   Behavior During Therapy [facility] for tasks assessed/performed      Past Medical History:  Diagnosis Date  . Arthritis   . GERD (gastroesophageal reflux disease)   . History of kidney stones   . Kidney stones   . Migraine   . Restless leg syndrome   . Swelling of lower limb    bialteral lower leg swelling    Past Surgical History:  Procedure Laterality Date  . MULTIPLE TOOTH EXTRACTIONS    . right eye lid surgery     . TOTAL KNEE ARTHROPLASTY Left 01/26/2017   Procedure: LEFT TOTAL KNEE ARTHROPLASTY;  Surgeon: Gaynelle Arabian, MD;  Location: [facility];  Service: Orthopedics;  Laterality: Left;  with block  . TUBAL LIGATION      There were no vitals filed for this visit.      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   Currently in Pain? Yes   Pain Score 5    Pain Location Knee   Pain Orientation Left   Pain Descriptors / Indicators Discomfort   Pain Type Surgical pain   Pain Onset More than a month ago   Pain Frequency Constant   Aggravating Factors  increased activity   Pain Relieving Factors rest                         [facility] Adult PT Treatment/Exercise - 04/15/17 0001      Knee/Hip Exercises: Aerobic   Nustep  x13 min L5     Knee/Hip Exercises: Standing   Terminal Knee Extension Limitations green t-band x20   Forward Step Up Left;Step Height: 8";10 reps;2 sets     Knee/Hip Exercises: Seated   Long Arc Quad Strengthening;Left;3 sets;15 reps   Long Arc Quad Weight 4 lbs.     Knee/Hip Exercises: Supine   Bridges with Clamshell Strengthening;Both;2 sets;10 reps  with red t-band     Acupuncturist Location L knee   Electrical Stimulation Action IFC   Electrical Stimulation Parameters 1-_0  x59mn   Electrical Stimulation Goals Edema     Vasopneumatic   Number Minutes Vasopneumatic  15 minutes   Vasopnuematic Location  Knee   Vasopneumatic Pressure Medium                  PT Short Term Goals - 04/02/17 1533      PT SHORT TERM GOAL #1   Title Independent with an initial HEP.   Time 2   Period Weeks   Status Achieved     PT SHORT TERM GOAL #2   Title Full active left knee extension in order to normalize gait  Time 2   Period Weeks   Status Achieved  full ext 04/02/17           PT Long Term Goals - 04/02/17 1534      PT LONG TERM GOAL #1   Title Independent with an advanced HEP.   Time 4   Period Weeks   Status On-going     PT LONG TERM GOAL #2   Title Active left knee flexion to 120-125 degrees+ so the patient can perform functional tasks and do so with pain not > 2-3/10.   Time 4   Period Weeks   Status On-going  AROM 118 degrees 04/02/17     PT LONG TERM GOAL #3   Title Increase left knee strength to a solid 5/5 to provide good stability for accomplishment of functional activities   Time 4   Period Weeks   Status On-going     PT LONG TERM GOAL #4   Title Perform a reciprocating stair gait with one railing with pain not > 2-3/10.   Time 4   Period Weeks   Status On-going               Plan - 04/15/17 1459    Clinical Impression Statement Patient tolerated treatment well today. Patient has limitions due  to pain in right knee and unable to perform prolong standing or weight machines at this time. Patient progressing with left LE strengthening. Remaining goals ongoing due to strength deficts.    Rehab Potential Excellent   PT Frequency 3x / week   PT Duration 4 weeks   PT Treatment/Interventions ADLs/Self Care Home Management   PT Next Visit Plan cont with POC for left knee ROM, strengthening and modalities sending re-cert, patient missed current MD appt   Consulted and Agree with Plan of Care Patient      Patient will benefit from skilled therapeutic intervention in order to improve the following deficits and impairments:  Abnormal gait, Decreased activity tolerance, Decreased range of motion, Decreased strength, Increased edema, Pain  Visit Diagnosis: Chronic pain of left knee  Stiffness of left knee, not elsewhere classified  Localized edema     Problem List Patient Active Problem List   Diagnosis Date Noted  . OA (osteoarthritis) of knee 01/26/2017  . Nephrolithiasis 11/15/2013  . Chest pain 10/22/2012  . Migraine 10/22/2012  . Numbness and tingling in hands 10/22/2012    Ladean Raya, PTA 04/15/17 3:13 PM  [facility] [address] [place], [place], [address] Phone: [phone]   Fax:  [phone]  Name: [name] [name] MRN: [id number] Date of Birth: 09/23/1960  PHYSICAL THERAPY DISCHARGE SUMMARY  Visits from Start of Care: 11.  Current functional level related to goals / functional outcomes: See above.   Remaining deficits: See LTG section.   Education / Equipment: HEP. Plan: Patient agrees to discharge.  Patient goals were partially met. Patient is being discharged due to being pleased with the current functional level.  ?????         Mali Applegate MPT

## 2017-04-17 ENCOUNTER — Encounter: Payer: 59 | Admitting: Physical Therapy

## 2017-04-21 ENCOUNTER — Encounter: Payer: 59 | Admitting: *Deleted

## 2017-04-23 ENCOUNTER — Encounter: Payer: 59 | Admitting: Physical Therapy

## 2017-06-04 DIAGNOSIS — R55 Syncope and collapse: Secondary | ICD-10-CM

## 2017-06-04 HISTORY — DX: Syncope and collapse: R55

## 2017-07-04 DIAGNOSIS — D649 Anemia, unspecified: Secondary | ICD-10-CM

## 2017-07-04 HISTORY — DX: Anemia, unspecified: D64.9

## 2017-07-09 ENCOUNTER — Inpatient Hospital Stay (HOSPITAL_COMMUNITY): Payer: 59

## 2017-07-09 ENCOUNTER — Encounter (HOSPITAL_COMMUNITY): Payer: Self-pay | Admitting: Emergency Medicine

## 2017-07-09 ENCOUNTER — Inpatient Hospital Stay (HOSPITAL_COMMUNITY)
Admission: EM | Admit: 2017-07-09 | Discharge: 2017-07-12 | DRG: 871 | Disposition: A | Discharge status: Home / Self Care | Payer: 59 | Attending: Internal Medicine | Admitting: Internal Medicine

## 2017-07-09 ENCOUNTER — Emergency Department (HOSPITAL_COMMUNITY): Payer: 59

## 2017-07-09 ENCOUNTER — Other Ambulatory Visit: Payer: Self-pay

## 2017-07-09 DIAGNOSIS — J069 Acute upper respiratory infection, unspecified: Secondary | ICD-10-CM | POA: Diagnosis present

## 2017-07-09 DIAGNOSIS — Z7901 Long term (current) use of anticoagulants: Secondary | ICD-10-CM | POA: Diagnosis not present

## 2017-07-09 DIAGNOSIS — R0602 Shortness of breath: Secondary | ICD-10-CM | POA: Diagnosis present

## 2017-07-09 DIAGNOSIS — Z888 Allergy status to other drugs, medicaments and biological substances status: Secondary | ICD-10-CM | POA: Diagnosis not present

## 2017-07-09 DIAGNOSIS — G43909 Migraine, unspecified, not intractable, without status migrainosus: Secondary | ICD-10-CM | POA: Diagnosis present

## 2017-07-09 DIAGNOSIS — G2581 Restless legs syndrome: Secondary | ICD-10-CM | POA: Diagnosis present

## 2017-07-09 DIAGNOSIS — Z791 Long term (current) use of non-steroidal anti-inflammatories (NSAID): Secondary | ICD-10-CM

## 2017-07-09 DIAGNOSIS — Z96652 Presence of left artificial knee joint: Secondary | ICD-10-CM | POA: Diagnosis present

## 2017-07-09 DIAGNOSIS — J40 Bronchitis, not specified as acute or chronic: Secondary | ICD-10-CM | POA: Diagnosis present

## 2017-07-09 DIAGNOSIS — Z79899 Other long term (current) drug therapy: Secondary | ICD-10-CM

## 2017-07-09 DIAGNOSIS — Z886 Allergy status to analgesic agent status: Secondary | ICD-10-CM | POA: Diagnosis not present

## 2017-07-09 DIAGNOSIS — J209 Acute bronchitis, unspecified: Secondary | ICD-10-CM | POA: Diagnosis present

## 2017-07-09 DIAGNOSIS — A4189 Other specified sepsis: Principal | ICD-10-CM | POA: Diagnosis present

## 2017-07-09 DIAGNOSIS — E876 Hypokalemia: Secondary | ICD-10-CM | POA: Diagnosis present

## 2017-07-09 DIAGNOSIS — Z87442 Personal history of urinary calculi: Secondary | ICD-10-CM | POA: Diagnosis not present

## 2017-07-09 DIAGNOSIS — D649 Anemia, unspecified: Secondary | ICD-10-CM | POA: Diagnosis not present

## 2017-07-09 DIAGNOSIS — R6510 Systemic inflammatory response syndrome (SIRS) of non-infectious origin without acute organ dysfunction: Secondary | ICD-10-CM

## 2017-07-09 DIAGNOSIS — K219 Gastro-esophageal reflux disease without esophagitis: Secondary | ICD-10-CM | POA: Diagnosis present

## 2017-07-09 DIAGNOSIS — R079 Chest pain, unspecified: Secondary | ICD-10-CM | POA: Diagnosis not present

## 2017-07-09 DIAGNOSIS — K449 Diaphragmatic hernia without obstruction or gangrene: Secondary | ICD-10-CM | POA: Diagnosis present

## 2017-07-09 DIAGNOSIS — J9600 Acute respiratory failure, unspecified whether with hypoxia or hypercapnia: Secondary | ICD-10-CM | POA: Diagnosis present

## 2017-07-09 DIAGNOSIS — R042 Hemoptysis: Secondary | ICD-10-CM | POA: Diagnosis present

## 2017-07-09 DIAGNOSIS — Z880 Allergy status to penicillin: Secondary | ICD-10-CM | POA: Diagnosis not present

## 2017-07-09 DIAGNOSIS — M1990 Unspecified osteoarthritis, unspecified site: Secondary | ICD-10-CM | POA: Diagnosis present

## 2017-07-09 DIAGNOSIS — B9781 Human metapneumovirus as the cause of diseases classified elsewhere: Secondary | ICD-10-CM | POA: Diagnosis present

## 2017-07-09 DIAGNOSIS — D500 Iron deficiency anemia secondary to blood loss (chronic): Secondary | ICD-10-CM | POA: Diagnosis present

## 2017-07-09 DIAGNOSIS — R05 Cough: Secondary | ICD-10-CM | POA: Diagnosis not present

## 2017-07-09 HISTORY — DX: Systemic inflammatory response syndrome (sirs) of non-infectious origin without acute organ dysfunction: R65.10

## 2017-07-09 HISTORY — DX: Acute upper respiratory infection, unspecified: J06.9

## 2017-07-09 LAB — CBC WITH DIFFERENTIAL/PLATELET
Basophils Absolute: 0 K/uL (ref 0.0–0.1)
Basophils Relative: 0 %
Eosinophils Absolute: 0 K/uL (ref 0.0–0.7)
Eosinophils Relative: 0 %
HCT: 20.5 % — ABNORMAL LOW (ref 36.0–46.0)
Hemoglobin: 5.8 g/dL — CL (ref 12.0–15.0)
Lymphocytes Relative: 7 %
Lymphs Abs: 0.7 K/uL (ref 0.7–4.0)
MCH: 21.2 pg — ABNORMAL LOW (ref 26.0–34.0)
MCHC: 28.3 g/dL — ABNORMAL LOW (ref 30.0–36.0)
MCV: 75.1 fL — ABNORMAL LOW (ref 78.0–100.0)
Monocytes Absolute: 0.6 K/uL (ref 0.1–1.0)
Monocytes Relative: 5 %
Neutro Abs: 9.4 K/uL — ABNORMAL HIGH (ref 1.7–7.7)
Neutrophils Relative %: 88 %
Platelets: 245 K/uL (ref 150–400)
RBC: 2.73 MIL/uL — ABNORMAL LOW (ref 3.87–5.11)
RDW: 18.6 % — ABNORMAL HIGH (ref 11.5–15.5)
WBC: 10.7 K/uL — ABNORMAL HIGH (ref 4.0–10.5)

## 2017-07-09 LAB — FERRITIN: Ferritin: 8 ng/mL — ABNORMAL LOW (ref 11–307)

## 2017-07-09 LAB — IRON AND TIBC
Iron: 5 ug/dL — ABNORMAL LOW (ref 28–170)
Saturation Ratios: 1 % — ABNORMAL LOW (ref 10.4–31.8)
TIBC: 424 ug/dL (ref 250–450)
UIBC: 419 ug/dL

## 2017-07-09 LAB — COMPREHENSIVE METABOLIC PANEL
ALK PHOS: 106 U/L (ref 38–126)
ALT: 13 U/L — ABNORMAL LOW (ref 14–54)
AST: 22 U/L (ref 15–41)
Albumin: 3.9 g/dL (ref 3.5–5.0)
Anion gap: 11 (ref 5–15)
BILIRUBIN TOTAL: 0.4 mg/dL (ref 0.3–1.2)
BUN: 9 mg/dL (ref 6–20)
CALCIUM: 8.8 mg/dL — AB (ref 8.9–10.3)
CO2: 19 mmol/L — ABNORMAL LOW (ref 22–32)
Chloride: 109 mmol/L (ref 101–111)
Creatinine, Ser: 0.75 mg/dL (ref 0.44–1.00)
GFR calc Af Amer: >60 mL/min (ref >60)
GFR calc non Af Amer: >60 mL/min (ref >60)
GLUCOSE: 135 mg/dL — AB (ref 65–99)
Potassium: 3.4 mmol/L — ABNORMAL LOW (ref 3.5–5.1)
Sodium: 139 mmol/L (ref 135–145)
TOTAL PROTEIN: 7.1 g/dL (ref 6.5–8.1)

## 2017-07-09 LAB — RESPIRATORY PANEL BY PCR
ADENOVIRUS-RVPPCR: NOT DETECTED
Bordetella pertussis: NOT DETECTED
CORONAVIRUS HKU1-RVPPCR: NOT DETECTED
CORONAVIRUS NL63-RVPPCR: NOT DETECTED
CORONAVIRUS OC43-RVPPCR: NOT DETECTED
Chlamydophila pneumoniae: NOT DETECTED
Coronavirus 229E: NOT DETECTED
Influenza A: NOT DETECTED
Influenza B: NOT DETECTED
Metapneumovirus: DETECTED — AB
Mycoplasma pneumoniae: NOT DETECTED
PARAINFLUENZA VIRUS 1-RVPPCR: NOT DETECTED
PARAINFLUENZA VIRUS 3-RVPPCR: NOT DETECTED
PARAINFLUENZA VIRUS 4-RVPPCR: NOT DETECTED
Parainfluenza Virus 2: NOT DETECTED
RHINOVIRUS / ENTEROVIRUS - RVPPCR: NOT DETECTED
Respiratory Syncytial Virus: NOT DETECTED

## 2017-07-09 LAB — PROCALCITONIN: Procalcitonin: 0.48 ng/mL

## 2017-07-09 LAB — RETICULOCYTES
RBC.: 2.99 MIL/uL — ABNORMAL LOW (ref 3.87–5.11)
Retic Count, Absolute: 59.8 K/uL (ref 19.0–186.0)
Retic Ct Pct: 2 % (ref 0.4–3.1)

## 2017-07-09 LAB — STREP PNEUMONIAE URINARY ANTIGEN: Strep Pneumo Urinary Antigen: NEGATIVE

## 2017-07-09 LAB — URINALYSIS, ROUTINE W REFLEX MICROSCOPIC
BILIRUBIN URINE: NEGATIVE
Glucose, UA: NEGATIVE mg/dL
Hgb urine dipstick: NEGATIVE
Ketones, ur: NEGATIVE mg/dL
LEUKOCYTES UA: NEGATIVE
Nitrite: NEGATIVE
PH: 6 (ref 5.0–8.0)
Protein, ur: NEGATIVE mg/dL
SPECIFIC GRAVITY, URINE: 1.005 (ref 1.005–1.030)

## 2017-07-09 LAB — HIV ANTIBODY (ROUTINE TESTING W REFLEX): HIV Screen 4th Generation wRfx: NONREACTIVE

## 2017-07-09 LAB — EXPECTORATED SPUTUM ASSESSMENT W REFEX TO RESP CULTURE

## 2017-07-09 LAB — PREPARE RBC (CROSSMATCH)

## 2017-07-09 LAB — MAGNESIUM: Magnesium: 1.8 mg/dL (ref 1.7–2.4)

## 2017-07-09 LAB — EXPECTORATED SPUTUM ASSESSMENT W GRAM STAIN, RFLX TO RESP C

## 2017-07-09 LAB — BRAIN NATRIURETIC PEPTIDE: B Natriuretic Peptide: 80.9 pg/mL (ref 0.0–100.0)

## 2017-07-09 LAB — I-STAT TROPONIN, ED: TROPONIN I, POC: 0 ng/mL (ref 0.00–0.08)

## 2017-07-09 LAB — POC OCCULT BLOOD, ED: Fecal Occult Bld: NEGATIVE

## 2017-07-09 LAB — FOLATE: FOLATE: 34 ng/mL (ref >5.9)

## 2017-07-09 LAB — LACTIC ACID, PLASMA: Lactic Acid, Venous: 2.4 mmol/L (ref 0.5–1.9)

## 2017-07-09 LAB — VITAMIN B12: Vitamin B-12: 1284 pg/mL — ABNORMAL HIGH (ref 180–914)

## 2017-07-09 MED ORDER — PANTOPRAZOLE SODIUM 40 MG PO TBEC
40.0000 mg | DELAYED_RELEASE_TABLET | Freq: Every day | ORAL | Status: DC
  Administered 2017-07-09 – 2017-07-12 (×4): 40 mg via ORAL
  Filled 2017-07-09 (×4): qty 1

## 2017-07-09 MED ORDER — DIPHENHYDRAMINE HCL 50 MG/ML IJ SOLN
12.5000 mg | Freq: Once | INTRAMUSCULAR | Status: AC
  Administered 2017-07-09: 12.5 mg via INTRAVENOUS
  Filled 2017-07-09: qty 1

## 2017-07-09 MED ORDER — METHYLPREDNISOLONE SODIUM SUCC 125 MG IJ SOLR
60.0000 mg | Freq: Three times a day (TID) | INTRAMUSCULAR | Status: DC
  Administered 2017-07-09 – 2017-07-11 (×6): 60 mg via INTRAVENOUS
  Filled 2017-07-09 (×6): qty 2

## 2017-07-09 MED ORDER — KETOROLAC TROMETHAMINE 30 MG/ML IJ SOLN
30.0000 mg | Freq: Once | INTRAMUSCULAR | Status: AC
  Administered 2017-07-09: 30 mg via INTRAVENOUS
  Filled 2017-07-09: qty 1

## 2017-07-09 MED ORDER — METHYLPREDNISOLONE SODIUM SUCC 125 MG IJ SOLR
125.0000 mg | Freq: Once | INTRAMUSCULAR | Status: AC
  Administered 2017-07-09: 125 mg via INTRAVENOUS
  Filled 2017-07-09: qty 2

## 2017-07-09 MED ORDER — GUAIFENESIN ER 600 MG PO TB12
600.0000 mg | ORAL_TABLET | Freq: Two times a day (BID) | ORAL | Status: DC
  Administered 2017-07-09 – 2017-07-12 (×7): 600 mg via ORAL
  Filled 2017-07-09 (×7): qty 1

## 2017-07-09 MED ORDER — HYDROCODONE-ACETAMINOPHEN 10-325 MG PO TABS
1.0000 | ORAL_TABLET | Freq: Four times a day (QID) | ORAL | Status: DC | PRN
  Administered 2017-07-11 (×2): 1 via ORAL
  Filled 2017-07-09 (×2): qty 1

## 2017-07-09 MED ORDER — SODIUM CHLORIDE 0.9 % IV SOLN
Freq: Once | INTRAVENOUS | Status: AC
  Administered 2017-07-09: 04:00:00 via INTRAVENOUS

## 2017-07-09 MED ORDER — LEVOFLOXACIN IN D5W 750 MG/150ML IV SOLN: 750.0000 mg | INTRAVENOUS | Status: DC

## 2017-07-09 MED ORDER — IPRATROPIUM-ALBUTEROL 0.5-2.5 (3) MG/3ML IN SOLN: 3.0000 mL | RESPIRATORY_TRACT | Status: DC | PRN

## 2017-07-09 MED ORDER — GUAIFENESIN 100 MG/5ML PO SOLN
5.0000 mL | ORAL | Status: DC | PRN
  Administered 2017-07-09: 100 mg via ORAL
  Filled 2017-07-09: qty 10

## 2017-07-09 MED ORDER — GABAPENTIN 400 MG PO CAPS
400.0000 mg | ORAL_CAPSULE | Freq: Three times a day (TID) | ORAL | Status: DC
  Administered 2017-07-09: 400 mg via ORAL
  Filled 2017-07-09: qty 1

## 2017-07-09 MED ORDER — AZTREONAM IN DEXTROSE 1 GM/50ML IV SOLN
1.0000 g | Freq: Three times a day (TID) | INTRAVENOUS | Status: DC
  Filled 2017-07-09: qty 50

## 2017-07-09 MED ORDER — METOCLOPRAMIDE HCL 5 MG/ML IJ SOLN
5.0000 mg | Freq: Once | INTRAMUSCULAR | Status: AC
  Administered 2017-07-09: 5 mg via INTRAVENOUS
  Filled 2017-07-09: qty 2

## 2017-07-09 MED ORDER — DEXTROSE 5 % IV SOLN
2.0000 g | Freq: Once | INTRAVENOUS | Status: AC
  Administered 2017-07-09: 2 g via INTRAVENOUS
  Filled 2017-07-09: qty 2

## 2017-07-09 MED ORDER — ACETAMINOPHEN 325 MG PO TABS
650.0000 mg | ORAL_TABLET | Freq: Four times a day (QID) | ORAL | Status: DC | PRN
  Administered 2017-07-09 (×2): 650 mg via ORAL
  Filled 2017-07-09 (×3): qty 2

## 2017-07-09 MED ORDER — IOPAMIDOL (ISOVUE-370) INJECTION 76%
100.0000 mL | Freq: Once | INTRAVENOUS | Status: AC | PRN
  Administered 2017-07-09: 100 mL via INTRAVENOUS

## 2017-07-09 MED ORDER — ONDANSETRON HCL 4 MG/2ML IJ SOLN: 4.0000 mg | Freq: Four times a day (QID) | INTRAMUSCULAR | Status: DC | PRN

## 2017-07-09 MED ORDER — DEXTROSE 5 % IV SOLN
1.0000 g | Freq: Three times a day (TID) | INTRAVENOUS | Status: DC
  Filled 2017-07-09: qty 1

## 2017-07-09 MED ORDER — GABAPENTIN 400 MG PO CAPS
800.0000 mg | ORAL_CAPSULE | Freq: Every day | ORAL | Status: DC
  Administered 2017-07-09 – 2017-07-11 (×3): 800 mg via ORAL
  Filled 2017-07-09 (×3): qty 2

## 2017-07-09 MED ORDER — SODIUM CHLORIDE 0.9 % IV BOLUS (SEPSIS)
1000.0000 mL | Freq: Once | INTRAVENOUS | Status: AC
  Administered 2017-07-09: 1000 mL via INTRAVENOUS

## 2017-07-09 MED ORDER — POTASSIUM CHLORIDE CRYS ER 20 MEQ PO TBCR
20.0000 meq | EXTENDED_RELEASE_TABLET | ORAL | Status: AC
  Administered 2017-07-09: 20 meq via ORAL
  Filled 2017-07-09: qty 1

## 2017-07-09 MED ORDER — MAGNESIUM OXIDE 400 (241.3 MG) MG PO TABS
400.0000 mg | ORAL_TABLET | Freq: Every day | ORAL | Status: DC
  Administered 2017-07-09 – 2017-07-12 (×4): 400 mg via ORAL
  Filled 2017-07-09 (×4): qty 1

## 2017-07-09 MED ORDER — SODIUM CHLORIDE 0.9 % IV SOLN: 1250.0000 mg | INTRAVENOUS | Status: DC

## 2017-07-09 MED ORDER — ALBUTEROL SULFATE (2.5 MG/3ML) 0.083% IN NEBU
5.0000 mg | INHALATION_SOLUTION | Freq: Once | RESPIRATORY_TRACT | Status: AC
  Administered 2017-07-09: 5 mg via RESPIRATORY_TRACT
  Filled 2017-07-09: qty 6

## 2017-07-09 MED ORDER — FERROUS SULFATE 325 (65 FE) MG PO TABS
325.0000 mg | ORAL_TABLET | Freq: Every day | ORAL | Status: DC
  Administered 2017-07-09 – 2017-07-10 (×2): 325 mg via ORAL
  Filled 2017-07-09 (×3): qty 1

## 2017-07-09 MED ORDER — IOPAMIDOL (ISOVUE-370) INJECTION 76%
INTRAVENOUS | Status: AC
  Filled 2017-07-09: qty 100

## 2017-07-09 MED ORDER — ONDANSETRON HCL 4 MG PO TABS: 4.0000 mg | ORAL_TABLET | Freq: Four times a day (QID) | ORAL | Status: DC | PRN

## 2017-07-09 MED ORDER — VANCOMYCIN HCL IN DEXTROSE 1-5 GM/200ML-% IV SOLN
1000.0000 mg | Freq: Once | INTRAVENOUS | Status: AC
  Administered 2017-07-09: 1000 mg via INTRAVENOUS
  Filled 2017-07-09: qty 200

## 2017-07-09 MED ORDER — LEVOFLOXACIN IN D5W 750 MG/150ML IV SOLN
750.0000 mg | Freq: Once | INTRAVENOUS | Status: AC
  Administered 2017-07-09: 750 mg via INTRAVENOUS
  Filled 2017-07-09: qty 150

## 2017-07-09 MED ORDER — KETOROLAC TROMETHAMINE 15 MG/ML IJ SOLN
15.0000 mg | Freq: Once | INTRAMUSCULAR | Status: AC
  Administered 2017-07-09: 15 mg via INTRAVENOUS
  Filled 2017-07-09: qty 1

## 2017-07-09 MED ORDER — DEXTROSE 5 % IV SOLN
1.0000 g | INTRAVENOUS | Status: DC
  Administered 2017-07-09 – 2017-07-11 (×3): 1 g via INTRAVENOUS
  Filled 2017-07-09 (×4): qty 10

## 2017-07-09 NOTE — Progress Notes (Signed)
Pt coughing up bight red blood. See flow sheet for vital sign. MD ntoifed. New orders placed in Epic. Will continue to monitor.

## 2017-07-09 NOTE — Progress Notes (Signed)
CRITICAL VALUE STICKER  CRITICAL VALUE: Lactic 2.4  MD NOTIFIED:  Hall. C  TIME OF NOTIFICATION:0714  RESPONSE: No new orders at this time, will continue to monitor

## 2017-07-09 NOTE — ED Triage Notes (Signed)
Pt states she took the flu shot last Tuesday and has been sick ever since  Pt is c/o cough, chest tightness, pain in her back around her shoulder blades, sore throat, and pain in the top of her head   Pt states she has taken OTC medications without relief   Pt states she started having vomiting about 5pm on Wednesday   Pt states she feels nervous and anxious

## 2017-07-09 NOTE — Care Management Note (Signed)
Case Management Note  Patient Details  Name: [name] [name] MRN: [id number] Date of Birth: Apr 04, 1961  Subjective/Objective:56 y/o f admitted w/symptomatic anemia. From home.                    Action/Plan:d/c plan home.   Expected Discharge Date:  07/11/17               Expected Discharge Plan:  Home/Self Care  In-House Referral:     Discharge planning Services  CM Consult  Post Acute Care Choice:    Choice offered to:     DME Arranged:    DME Agency:     HH Arranged:    HH Agency:     Status of Service:  In process, will continue to follow  If discussed at Long Length of Stay Meetings, dates discussed:    Additional Comments:  Lanier ClamMahabir, , RN 07/09/2017, 11:08 AM

## 2017-07-09 NOTE — H&P (Signed)
History and Physical    [name]:[id number] DOB: 08/10/1960 DOA: 07/09/2017  Referring MD/NP/PA: Dr. Stark Jock PCP: Octavio Graves, DO  Patient coming from: home  Chief Complaint: Shortness of breath  I have personally briefly reviewed patient's old medical records in [place]   HPI: [name] [name] is a 56 y.o. female with medical history significant of arthritis, nephrolithiasis, and GERD; who presents with complaints of progressively worsening shortness of breath over the last 4 days.  Initially complaining of a sore throat, headache, and a minimally productive cough.  Associated symptoms included sinus pressure, posttussive emesis, chest discomfort,and generalized myalgias.  She called into her doctor's office and was advised to try over-the-counter medications which provided no relief of symptoms.  She equates the symptoms onset with receiving the flu vaccine 1 week ago.  She reports being in close contact with her daughter who was recently diagnosed with bronchitis.  The patient had previously been advised to take iron and potassium supplements, but has not taken these in several weeks to months.  Denies having any black or dark stools to her knowledge, and only utilizes prescribed opioid pain medication for headaches.  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  Denies any calf pain, diarrhea, abdominal pain, dysuria, loss of consciousness, or vaginal bleeding.    ED Course: Upon admission into the emergency department patient was seen to be febrile up to 100.6 F, pulse 106-115, respirations 21-23, blood pressures maintained, and O2 saturations 93-100%.  Labs revealed WBC 10.7, hemoglobin 5.8, potassium 3.4, BNP 80.9, troponin 0.  Chest x-ray showed borderline cardiomegaly with enlarged hiatal hernia, but no acute infiltrate.  Patient was noted to have guaiac negative stools. Patient was typed and  screened for possible need of blood products.  TRH called to admit.  Review of Systems  Constitutional: Positive for fever and malaise/fatigue.  HENT: Positive for congestion and sore throat.   Eyes: Negative for photophobia and pain.  Respiratory: Positive for cough, sputum production and shortness of breath.   Cardiovascular: Positive for chest pain. Negative for leg swelling.  Gastrointestinal: Positive for vomiting. Negative for abdominal pain, constipation and diarrhea.  Genitourinary: Negative for dysuria and frequency.  Musculoskeletal: Positive for joint pain and myalgias.  Skin: Negative for itching and rash.  Neurological: Positive for weakness and headaches. Negative for loss of consciousness.  Psychiatric/Behavioral: Negative for substance abuse and suicidal ideas.    Past Medical History:  Diagnosis Date  . Arthritis   . GERD (gastroesophageal reflux disease)   . History of kidney stones   . Kidney stones   . Migraine   . Restless leg syndrome   . Swelling of lower limb    bialteral lower leg swelling    Past Surgical History:  Procedure Laterality Date  . MULTIPLE TOOTH EXTRACTIONS    . right eye lid surgery     . TOTAL KNEE ARTHROPLASTY Left 01/26/2017   Procedure: LEFT TOTAL KNEE ARTHROPLASTY;  Surgeon: Gaynelle Arabian, MD;  Location: [facility];  Service: Orthopedics;  Laterality: Left;  with block  . TUBAL LIGATION       reports that  has never smoked. she has never used smokeless tobacco. She reports that she drinks alcohol. She reports that she does not use drugs.  Allergies  Allergen Reactions  . Acetaminophen Other (See Comments)    Very sleepy  . Topamax [  Topiramate] Other (See Comments)    Kidney stones  . Penicillins Hives    FEB 2018 - broke out in hives    Family History  Problem Relation Age of Onset  . Hypertension Mother   . Diabetes Mellitus II Mother   . Hypertension Father   . Diabetes Mellitus II Father     Prior to Admission  medications   Medication Sig Start Date End Date Taking? Authorizing Provider  cetirizine (ZYRTEC) 10 MG tablet Take 10 mg by mouth daily.    [provider]  esomeprazole (NEXIUM) 20 MG capsule Take 20 mg by mouth daily.    [provider]  furosemide (LASIX) 20 MG tablet Take 20 mg by mouth daily as needed for edema.     [provider]  gabapentin (NEURONTIN) 300 MG capsule Take 1 capsule (300 mg total) by mouth 3 (three) times daily. 01/27/17   Perkins, Alexzandrew L, PA-C  magnesium oxide (MAG-OX) 400 MG tablet Take 400 mg by mouth daily.    [provider]  Melatonin 5 MG TABS Take 5-10 mg by mouth at bedtime as needed (sleep).     [provider]  methocarbamol (ROBAXIN) 500 MG tablet Take 1 tablet (500 mg total) by mouth every 6 (six) hours as needed for muscle spasms. 01/27/17   Perkins, Alexzandrew L, PA-C  oxyCODONE (OXY IR/ROXICODONE) 5 MG immediate release tablet Take 1-3 tablets (5-15 mg total) by mouth every 4 (four) hours as needed for moderate pain or severe pain. 01/27/17   Perkins, Alexzandrew L, PA-C  rivaroxaban (XARELTO) 10 MG TABS tablet Take 1 tablet (10 mg total) by mouth daily with breakfast. Take Xarelto for two and a half more weeks following discharge from the hospital, then discontinue Xarelto. Once the patient has completed the blood thinner regimen, then take a Baby 81 mg Aspirin daily for three more weeks. 01/28/17   Perkins, Alexzandrew L, PA-C  traMADol (ULTRAM) 50 MG tablet Take 1-2 tablets (50-100 mg total) by mouth every 6 (six) hours as needed for moderate pain. 01/27/17   Dara Lords, Alexzandrew L, PA-C    Physical Exam:  Constitutional: Older female who appears acutely ill, but able to follow commands. Vitals:   07/09/17 0009 07/09/17 0136 07/09/17 0140 07/09/17 0200  BP: (!) 162/88  131/65 (!) 141/58  Pulse: (!) 106  (!) 115 (!) 113  Resp: (!) 21  (!) 21 (!) 23  Temp: 100.1 F (37.8 C)  (!) 100.6 F (38.1 C)     TempSrc: Oral  Oral   SpO2: 96% 93% 100% 98%  Weight:   82.1 kg (181 lb)   Height:   5' 5"  (1.651 m)    Eyes: PERRL, lids and conjunctivae normal ENMT: Mucous membranes are dry. Posterior pharynx clear of any exudate or lesions. Poor dentition Neck: normal, supple, no masses, no thyromegaly Respiratory: Tachypneic with diffuse expiratory wheezes and rhonchi appreciated throughout both lung fields. Cardiovascular: Regular rate and rhythm, no murmurs / rubs / gallops. No extremity edema. 2+ pedal pulses. No carotid bruits.  Abdomen: no tenderness, no masses palpated. No hepatosplenomegaly. Bowel sounds positive.  Musculoskeletal: no clubbing / cyanosis. No joint deformity upper and lower extremities. Good ROM, no contractures. Normal muscle tone.  Skin: Pallor present. no rashes, lesions, ulcers. No induration Neurologic: CN 2-12 grossly intact. Sensation intact, DTR normal. Strength 5/5 in all 4.  Psychiatric: Normal judgment and insight. Alert and oriented x 3. Normal mood.     Labs on Admission:  I have personally reviewed following labs and imaging studies  CBC: Recent Labs  Lab 07/09/17 0239  WBC 10.7*  NEUTROABS 9.4*  HGB 5.8*  HCT 20.5*  MCV 75.1*  PLT 520   Basic Metabolic Panel: Recent Labs  Lab 07/09/17 0239  NA 139  K 3.4*  CL 109  CO2 19*  GLUCOSE 135*  BUN 9  CREATININE 0.75  CALCIUM 8.8*   GFR: Estimated Creatinine Clearance: 82.1 mL/min (by C-G formula based on SCr of 0.75 mg/dL). Liver Function Tests: Recent Labs  Lab 07/09/17 0239  AST 22  ALT 13*  ALKPHOS 106  BILITOT 0.4  PROT 7.1  ALBUMIN 3.9   No results for input(s): LIPASE, AMYLASE in the last 168 hours. No results for input(s): AMMONIA in the last 168 hours. Coagulation Profile: No results for input(s): INR, PROTIME in the last 168 hours. Cardiac Enzymes: No results for input(s): CKTOTAL, CKMB, CKMBINDEX, TROPONINI in the last 168 hours. BNP (last 3 results) No results for  input(s): PROBNP in the last 8760 hours. HbA1C: No results for input(s): HGBA1C in the last 72 hours. CBG: No results for input(s): GLUCAP in the last 168 hours. Lipid Profile: No results for input(s): CHOL, HDL, LDLCALC, TRIG, CHOLHDL, LDLDIRECT in the last 72 hours. Thyroid Function Tests: No results for input(s): TSH, T4TOTAL, FREET4, T3FREE, THYROIDAB in the last 72 hours. Anemia Panel: No results for input(s): VITAMINB12, FOLATE, FERRITIN, TIBC, IRON, RETICCTPCT in the last 72 hours. Urine analysis:    Component Value Date/Time   COLORURINE YELLOW 11/15/2013 1007   APPEARANCEUR CLOUDY (A) 11/15/2013 1007   LABSPEC 1.012 11/15/2013 1007   PHURINE 7.5 11/15/2013 1007   GLUCOSEU NEGATIVE 11/15/2013 1007   HGBUR LARGE (A) 11/15/2013 1007   BILIRUBINUR NEGATIVE 11/15/2013 1007   KETONESUR NEGATIVE 11/15/2013 1007   PROTEINUR NEGATIVE 11/15/2013 1007   UROBILINOGEN 0.2 11/15/2013 1007   NITRITE NEGATIVE 11/15/2013 1007   LEUKOCYTESUR TRACE (A) 11/15/2013 1007   Sepsis Labs: No results found for this or any previous visit (from the past 240 hour(s)).   Radiological Exams on Admission: Dg Chest 2 View  Result Date: 07/09/2017 CLINICAL DATA:  56 y/o F; cough, congestion, upper back pain, headache. EXAM: CHEST  2 VIEW COMPARISON:  01/17/2013 chest radiograph FINDINGS: Borderline cardiomegaly. Enlarged moderate hiatal hernia. No focal consolidation. No pleural effusion or pneumothorax. No acute osseous abnormality is evident. IMPRESSION: Borderline cardiomegaly. Enlarged moderate hiatal hernia. No acute pulmonary process identified. Electronically Signed   By: Kristine Garbe M.D.   On: 07/09/2017 00:59    EKG: Independently reviewed.  Sinus tachycardia at 104 bpm  Assessment/Plan Symptomatic anemia: Acute.  Patient presents with complaints of generalized weakness, shortness of breath, and fatigue.  Denies any NSAIDs or blood thinner use. Hemoglobin noted to be 5.8 on  admission, and previously noted to be 9.3 on 01/28/2017.  Vital signs currently stable.  Stool Guaiacs were noted to be negative for any signs of blood.  Patient was typed and screened for possible need of blood products.  Question possibility of diverticular bleed now resolved vs severe iron deficiency anemia. - Admit to telemetry bed - Clear liquid diet - Check stat anemia panel - Transfuse 3 units of PRBCs - Will consider need of IV Lasix as needed for signs of fluid overload - May warrant consult to gastroenterology in am  SIRS/sepsis 2/2 URI: Acute.  Patient presents febrile with a temperature up to 100.6 F with tachycardia, tachypnea, and WBC count of 10.7.  Some symptoms could be attributed to the patient's anemia.  Question possibility of underlying infection most likely respiratory in nature. - Sepsis protocol initiated - Check lactic acid level - Check respiratory virus panel - Follow-up urinalysis, and blood cultures - Empiric antibiotics of Levaquin, aztreonam, and vancomycin as patient has penicillin allergy  Bronchitis: Acute.  Patient reports no previous history of tobacco use, but found to be diffusely wheezing on physical exam. - Solu-Medrol 125 mg IV x1 dose now, then 60 mg q 8 hr  - Duonebs prn sob/Wheezing  Dyspnea: Acute.  Patient maintaining oxygenation on room air at this time.  Suspect symptoms could be secondary to significant anemia.  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 - May warrant further investigation if symptoms persist despite being administered blood  Borderline cardiomegaly: Acute.  Finding seen on chest x-ray.  Question enlarged heart secondary to patient's anemia. - Consider need of further imaging in a.m.  Hypokalemia: Acute.  Initial potassium 3.4 on admission. - Give 20 meq of potassium chloride x1 dose now - Continue to monitor and replace as  needed  Arthritis - Continue Norco prn pain  Gerd: Patient noted to have hiatal hernia on chest x-ray. - Continue pharmacy substitution of Protonix for Nexium   DVT prophylaxis: SCDs Code Status: full Family Communication: no family present at bedside Disposition Plan: Likely discharge home in 1-2 days. Consults called: none Admission status: Inpatient  Norval Morton MD [facility] Pager [phone]   If 7PM-7AM, please contact night-coverage [email or web address] Password [email or web address]  07/09/2017, 3:29 AM

## 2017-07-09 NOTE — Progress Notes (Signed)
Pharmacy Antibiotic Note  [name] [name] is a 56 y.o. female admitted on 07/09/2017 with sepsis.  Pharmacy has been consulted for Vancomycin, aztreonam, levofloxacin dosing.  Plan: Vancomycin 1gm iv x1, then 1250mg  iv q24hr  Goal AUC = 400 - 500 for all indications, except meningitis (goal AUC > 500 and Cmin 15-20 mcg/mL)  Aztreonam 2gm iv x1, then 1gm iv q8hr Levofloxacin 750mg  iv q24hr   Height: 5\' 5"  (165.1 cm) Weight: 181 lb (82.1 kg) IBW/kg (Calculated) : 57  Temp (24hrs), Avg:100.4 F (38 C), Min:100.1 F (37.8 C), Max:100.6 F (38.1 C)  Recent Labs  Lab 07/09/17 0239  WBC 10.7*  CREATININE 0.75    Estimated Creatinine Clearance: 82.1 mL/min (by C-G formula based on SCr of 0.75 mg/dL).    Allergies  Allergen Reactions  . Acetaminophen Other (See Comments)    Very sleepy  . Topamax [Topiramate] Other (See Comments)    Kidney stones  . Penicillins Hives    FEB 2018 - broke out in hives    Antimicrobials this admission: Vancomycin 07/09/2017 >> Aztreonam 07/09/2017 >>  Levofloxacin 07/09/2017 >>   Dose adjustments this admission: -  Microbiology results: pending  Thank you for allowing pharmacy to be a part of this patient's care.  Aleene DavidsonGrimsley Jr,  [place] 07/09/2017 4:03 AM

## 2017-07-09 NOTE — ED Provider Notes (Signed)
[facility] DEPT Provider Note   CSN: [id number] Arrival date & time: 07/09/17  0000     History   Chief Complaint Chief Complaint  Patient presents with  . Cough  . Chest Pain    HPI [name] [name] is a 56 y.o. female.  Patient is a 56 year old female with past medical history of migraines, kidney stones, and osteoarthritis presenting for evaluation of congestion, cough, difficulty breathing.  This is progressed over the past week.  She reports a cough that is intermittently productive but denies any definite fevers.  She denies any chest pain, leg swelling, calf pain.   The history is provided by the patient.  Cough  This is a new problem. Episode onset: 1 week ago. The problem occurs constantly. The problem has been gradually worsening. The cough is productive of sputum. There has been no fever. She has tried nothing for the symptoms. The treatment provided no relief. She is not a smoker. Her past medical history does not include pneumonia, COPD, emphysema or asthma.    Past Medical History:  Diagnosis Date  . Arthritis   . GERD (gastroesophageal reflux disease)   . History of kidney stones   . Kidney stones   . Migraine   . Restless leg syndrome   . Swelling of lower limb    bialteral lower leg swelling    Patient Active Problem List   Diagnosis Date Noted  . OA (osteoarthritis) of knee 01/26/2017  . Nephrolithiasis 11/15/2013  . Chest pain 10/22/2012  . Migraine 10/22/2012  . Numbness and tingling in hands 10/22/2012    Past Surgical History:  Procedure Laterality Date  . MULTIPLE TOOTH EXTRACTIONS    . right eye lid surgery     . TOTAL KNEE ARTHROPLASTY Left 01/26/2017   Procedure: LEFT TOTAL KNEE ARTHROPLASTY;  Surgeon: Gaynelle Arabian, MD;  Location: [facility];  Service: Orthopedics;  Laterality: Left;  with block  . TUBAL LIGATION      OB History    No data available       Home Medications    Prior to Admission  medications   Medication Sig Start Date End Date Taking? Authorizing Provider  cetirizine (ZYRTEC) 10 MG tablet Take 10 mg by mouth daily.    [provider]  esomeprazole (NEXIUM) 20 MG capsule Take 20 mg by mouth daily.    [provider]  furosemide (LASIX) 20 MG tablet Take 20 mg by mouth daily as needed for edema.     [provider]  gabapentin (NEURONTIN) 300 MG capsule Take 1 capsule (300 mg total) by mouth 3 (three) times daily. 01/27/17   Perkins, Alexzandrew L, PA-C  magnesium oxide (MAG-OX) 400 MG tablet Take 400 mg by mouth daily.    [provider]  Melatonin 5 MG TABS Take 5-10 mg by mouth at bedtime as needed (sleep).     [provider]  methocarbamol (ROBAXIN) 500 MG tablet Take 1 tablet (500 mg total) by mouth every 6 (six) hours as needed for muscle spasms. 01/27/17   Perkins, Alexzandrew L, PA-C  oxyCODONE (OXY IR/ROXICODONE) 5 MG immediate release tablet Take 1-3 tablets (5-15 mg total) by mouth every 4 (four) hours as needed for moderate pain or severe pain. 01/27/17   Perkins, Alexzandrew L, PA-C  rivaroxaban (XARELTO) 10 MG TABS tablet Take 1 tablet (10 mg total) by mouth daily with breakfast. Take Xarelto for two and a half more weeks following discharge from the hospital,  then discontinue Xarelto. Once the patient has completed the blood thinner regimen, then take a Baby 81 mg Aspirin daily for three more weeks. 01/28/17   Perkins, Alexzandrew L, PA-C  traMADol (ULTRAM) 50 MG tablet Take 1-2 tablets (50-100 mg total) by mouth every 6 (six) hours as needed for moderate pain. 01/27/17   Perkins, Alexzandrew L, PA-C    Family History Family History  Problem Relation Age of Onset  . Hypertension Mother   . Diabetes Mellitus II Mother   . Hypertension Father   . Diabetes Mellitus II Father     Social History Social History   Tobacco Use  . Smoking status: Never Smoker  . Smokeless tobacco: Never Used  Substance Use Topics  .  Alcohol use: Yes    Comment: occ  . Drug use: No     Allergies   Acetaminophen; Topamax [topiramate]; and Penicillins   Review of Systems Review of Systems  All other systems reviewed and are negative.    Physical Exam Updated Vital Signs BP 131/65   Pulse (!) 115   Temp (!) 100.6 F (38.1 C) (Oral)   Resp (!) 21   Ht 5' 5"  (1.651 m)   Wt 82.1 kg (181 lb)   LMP 06/24/2012   SpO2 100%   BMI 30.12 kg/m   Physical Exam  Constitutional: She is oriented to person, place, and time. She appears well-developed and well-nourished. No distress.  HENT:  Head: Normocephalic and atraumatic.  Neck: Normal range of motion. Neck supple.  Cardiovascular: Normal rate and regular rhythm. Exam reveals no gallop and no friction rub.  No murmur heard. Pulmonary/Chest: Effort normal. No respiratory distress. She has no wheezes.  There are rhonchorous breath sounds bilaterally.  Abdominal: Soft. Bowel sounds are normal. She exhibits no distension. There is no tenderness.  Musculoskeletal: Normal range of motion.       Right lower leg: Normal. She exhibits no tenderness and no edema.       Left lower leg: Normal. She exhibits no tenderness and no edema.  Neurological: She is alert and oriented to person, place, and time.  Skin: Skin is warm and dry. She is not diaphoretic.  Nursing note and vitals reviewed.    ED Treatments / Results  Labs (all labs ordered are listed, but only abnormal results are displayed) Labs Reviewed  COMPREHENSIVE METABOLIC PANEL  CBC WITH DIFFERENTIAL/PLATELET  BRAIN NATRIURETIC PEPTIDE  I-STAT TROPONIN, ED    EKG  EKG Interpretation  Date/Time:  Thursday July 09 2017 00:08:57 EST Ventricular Rate:  104 PR Interval:    QRS Duration: 80 QT Interval:  330 QTC Calculation: 434 R Axis:   9 Text Interpretation:  Sinus tachycardia Consider anterior infarct Minimal ST depression, diffuse leads Baseline wander in lead(s) III aVL V1 Confirmed by Veryl Speak [phone]) on 07/09/2017 12:13:09 AM       Radiology Dg Chest 2 View  Result Date: 07/09/2017 CLINICAL DATA:  56 y/o F; cough, congestion, upper back pain, headache. EXAM: CHEST  2 VIEW COMPARISON:  01/17/2013 chest radiograph FINDINGS: Borderline cardiomegaly. Enlarged moderate hiatal hernia. No focal consolidation. No pleural effusion or pneumothorax. No acute osseous abnormality is evident. IMPRESSION: Borderline cardiomegaly. Enlarged moderate hiatal hernia. No acute pulmonary process identified. Electronically Signed   By: Kristine Garbe M.D.   On: 07/09/2017 00:59    Procedures Procedures (including critical care time)  Medications Ordered in ED Medications  ketorolac (TORADOL) 30 MG/ML injection 30 mg (not administered)  albuterol (  PROVENTIL) (2.5 MG/3ML) 0.083% nebulizer solution 5 mg (5 mg Nebulization Given 07/09/17 0057)     Initial Impression / Assessment and Plan / ED Course  I have reviewed the triage vital signs and the nursing notes.  Pertinent labs & imaging results that were available during my care of the patient were reviewed by me and considered in my medical decision making (see chart for details).  Patient presents here with congestion, cough, chest pain, shortness of breath that is worsened over the past several days.  She was initially febrile upon presentation and I suspect a viral URI.  Her chest x-ray shows no pneumonia.  Her workup does reveal a hemoglobin of 5.8, however rectal exam reveals heme-negative stool.  She denies any recent black or bloody bowel movements or vaginal bleeding that would explain her anemia.  As she is symptomatic with a hemoglobin of 5.8, I feel as though she will require admission for transfusion and further workup of this anemia.  I have spoken with Dr. Tamala Julian who agrees to admit.  Final Clinical Impressions(s) / ED Diagnoses   Final diagnoses:  None    ED Discharge Orders    None       Veryl Speak,  MD 07/09/17 [phone]

## 2017-07-09 NOTE — Progress Notes (Addendum)
[name] [name] is a 56 y.o. female with medical history significant of arthritis, nephrolithiasis, and GERD; who presents early this morning 07/09/17 with complaints of progressively worsening shortness of breath over the last 4 days. Upon presentation Hg 5.8. FOBT negative. Normal renal function. Admitted for symptomatic anemia. Order to transfuse 3U PRBCs.  Hemoptysis reported this am. CTA chest PE ordered. Pt on xarelto but she does not know why. Will call PCP's office to clarify.  Update: CTA PE negative for acute PE. Suspected bilateral multilobular CAP, poa. IV ceftriaxone and IV aztreonam day #0

## 2017-07-10 DIAGNOSIS — R079 Chest pain, unspecified: Secondary | ICD-10-CM

## 2017-07-10 DIAGNOSIS — D500 Iron deficiency anemia secondary to blood loss (chronic): Secondary | ICD-10-CM

## 2017-07-10 DIAGNOSIS — R05 Cough: Secondary | ICD-10-CM

## 2017-07-10 LAB — RETICULOCYTES
RBC.: 4.16 MIL/uL (ref 3.87–5.11)
Retic Count, Absolute: 49.9 10*3/uL (ref 19.0–186.0)
Retic Ct Pct: 1.2 % (ref 0.4–3.1)

## 2017-07-10 LAB — VITAMIN B12: Vitamin B-12: 1396 pg/mL — ABNORMAL HIGH (ref 180–914)

## 2017-07-10 LAB — TYPE AND SCREEN
ABO/RH(D): O POS
Antibody Screen: NEGATIVE
UNIT DIVISION: 0
Unit division: 0
Unit division: 0

## 2017-07-10 LAB — BPAM RBC
BLOOD PRODUCT EXPIRATION DATE: 201901012359
Blood Product Expiration Date: 201901012359
Blood Product Expiration Date: 201901052359
ISSUE DATE / TIME: 201812060902
ISSUE DATE / TIME: 201812061151
ISSUE DATE / TIME: 201812061507
UNIT TYPE AND RH: 5100
Unit Type and Rh: 5100
Unit Type and Rh: 5100

## 2017-07-10 LAB — BASIC METABOLIC PANEL
Anion gap: 7 (ref 5–15)
BUN: 10 mg/dL (ref 6–20)
CO2: 20 mmol/L — ABNORMAL LOW (ref 22–32)
Calcium: 9.3 mg/dL (ref 8.9–10.3)
Chloride: 114 mmol/L — ABNORMAL HIGH (ref 101–111)
Creatinine, Ser: 0.59 mg/dL (ref 0.44–1.00)
GFR calc non Af Amer: >60 mL/min (ref >60)
Glucose, Bld: 186 mg/dL — ABNORMAL HIGH (ref 65–99)
Potassium: 4 mmol/L (ref 3.5–5.1)
SODIUM: 141 mmol/L (ref 135–145)

## 2017-07-10 LAB — FERRITIN: FERRITIN: 15 ng/mL (ref 11–307)

## 2017-07-10 LAB — IRON AND TIBC
Iron: 53 ug/dL (ref 28–170)
SATURATION RATIOS: 13 % (ref 10.4–31.8)
TIBC: 396 ug/dL (ref 250–450)
UIBC: 343 ug/dL

## 2017-07-10 LAB — CBC
HCT: 31.8 % — ABNORMAL LOW (ref 36.0–46.0)
Hemoglobin: 9.3 g/dL — ABNORMAL LOW (ref 12.0–15.0)
MCH: 23 pg — ABNORMAL LOW (ref 26.0–34.0)
MCHC: 29.2 g/dL — AB (ref 30.0–36.0)
MCV: 78.5 fL (ref 78.0–100.0)
Platelets: 195 10*3/uL (ref 150–400)
RBC: 4.05 MIL/uL (ref 3.87–5.11)
RDW: 18 % — ABNORMAL HIGH (ref 11.5–15.5)
WBC: 9.8 10*3/uL (ref 4.0–10.5)

## 2017-07-10 LAB — SAVE SMEAR

## 2017-07-10 MED ORDER — FERROUS SULFATE 325 (65 FE) MG PO TABS
325.0000 mg | ORAL_TABLET | Freq: Two times a day (BID) | ORAL | Status: DC
  Administered 2017-07-11 – 2017-07-12 (×3): 325 mg via ORAL
  Filled 2017-07-10 (×3): qty 1

## 2017-07-10 MED ORDER — ENOXAPARIN SODIUM 40 MG/0.4ML ~~LOC~~ SOLN
40.0000 mg | SUBCUTANEOUS | Status: DC
  Administered 2017-07-10: 40 mg via SUBCUTANEOUS
  Filled 2017-07-10: qty 0.4

## 2017-07-10 MED ORDER — IPRATROPIUM-ALBUTEROL 0.5-2.5 (3) MG/3ML IN SOLN
3.0000 mL | Freq: Four times a day (QID) | RESPIRATORY_TRACT | Status: DC
  Administered 2017-07-10: 3 mL via RESPIRATORY_TRACT
  Filled 2017-07-10 (×2): qty 3

## 2017-07-10 NOTE — Consult Note (Signed)
New Hematology/Oncology Consult   Referral MD: Dr Darlin Droparole N Hall    Reason for Referral: Severe anemia  HPI:  [name] [name] is a 56 y.o. female with past medical history significant for migraine headaches, nephrolithiasis, GERD, osteoarthritis, and chronic NSAID use who presented to the hospital with rapidly progressive dyspnea with exertion and declining ability to participate in activities of daily living.  Patient reports being at her baseline approximately 5-6 days prior to presentation.  While assisting her daughter with babysitting duties, patient noticed significant difficulty with shortness of breath and chest discomfort while walking.  Prior to the onset of symptoms, patient had contact with her daughter who was suffering from sinusitis and possible respiratory infection.  Patient also complained of chest congestion and sore throat with her symptoms, but did not have any night sweats or fever.  She did have cough.  Which was not productive, but was severe enough to cause nausea and vomiting.  Patient presented and on evaluation was found to have mild fever of 100.6, tachycardia of 115, tachypnea with respirations of 23.  Her hemoglobin was found to be 5.8 and she was admitted to the hospital for additional evaluation and treatment.  Patient has received 3 units of packed red blood cells with hemoglobin improving up to 9.3 this morning.  Patient reports feeling significantly better, but feels somewhat fatigued still.  Patient denies episodes of melena.  She has been having dark formed stools for several years, she attributes to hydrocodone although on additional questioning the darker stool started several years after medication initiation.  Shunt is receiving twice-daily naproxen, and has been for an extended period of time.  She also has symptoms of heartburn and takes regular Nexium.  Patient drinks alcohol infrequently.  Patient has never had endoscopy or colonoscopy.  She has no family  history of colon cancer, but her father has had colon polyps removed in the past.     Past Medical History:  Diagnosis Date  . Arthritis   . GERD (gastroesophageal reflux disease)   . History of kidney stones   . Kidney stones   . Migraine   . Restless leg syndrome   . Swelling of lower limb    bialteral lower leg swelling  :  Past Surgical History:  Procedure Laterality Date  . MULTIPLE TOOTH EXTRACTIONS    . right eye lid surgery     . TOTAL KNEE ARTHROPLASTY Left 01/26/2017   Procedure: LEFT TOTAL KNEE ARTHROPLASTY;  Surgeon: Ollen GrossAluisio, Frank, MD;  Location: [facility];  Service: Orthopedics;  Laterality: Left;  with block  . TUBAL LIGATION    :   Current Facility-Administered Medications:  .  acetaminophen (TYLENOL) tablet 650 mg, 650 mg, Oral, Q6H PRN, Madelyn FlavorsSmith, Rondell A, MD, 650 mg at 07/09/17 1952 .  cefTRIAXone (ROCEPHIN) 1 g in dextrose 5 % 50 mL IVPB, 1 g, Intravenous, Q24H, Hall, Carole N, DO, Stopped at 07/10/17 1203 .  ferrous sulfate tablet 325 mg, 325 mg, Oral, Q breakfast, Katrinka BlazingSmith, Rondell A, MD, 325 mg at 07/10/17 0836 .  gabapentin (NEURONTIN) capsule 800 mg, 800 mg, Oral, QHS, BellVeatrice Bourbon, Michelle T, RPH, 800 mg at 07/09/17 2132 .  guaiFENesin (MUCINEX) 12 hr tablet 600 mg, 600 mg, Oral, BID, Smith, Rondell A, MD, 600 mg at 07/10/17 0837 .  guaiFENesin (ROBITUSSIN) 100 MG/5ML solution 100 mg, 5 mL, Oral, Q4H PRN, Dow AdolphHall, Carole N, DO, 100 mg at 07/09/17 1525 .  HYDROcodone-acetaminophen (NORCO) 10-325 MG per tablet 1 tablet, 1  tablet, Oral, QID PRN, Smith, Rondell A, MD .  ipratropium-albuterol (DUONEB) 0.5-2.5 (3) MG/3ML nebulizer solution 3 mL, 3 mL, Nebulization, Q4H PRN, Smith, Rondell A, MD .  magnesium oxide (MAG-OX) tablet 400 mg, 400 mg, Oral, Daily, Katrinka Blazing, Rondell A, MD, 400 mg at 07/10/17 0836 .  methylPREDNISolone sodium succinate (SOLU-MEDROL) 125 mg/2 mL injection 60 mg, 60 mg, Intravenous, Q8H, Smith, Rondell A, MD, 60 mg at 07/10/17 1352 .  ondansetron (ZOFRAN)  tablet 4 mg, 4 mg, Oral, Q6H PRN **OR** ondansetron (ZOFRAN) injection 4 mg, 4 mg, Intravenous, Q6H PRN, Smith, Rondell A, MD .  pantoprazole (PROTONIX) EC tablet 40 mg, 40 mg, Oral, Daily, Smith, Rondell A, MD, 40 mg at 07/10/17 0836:  . ferrous sulfate  325 mg Oral Q breakfast  . gabapentin  800 mg Oral QHS  . guaiFENesin  600 mg Oral BID  . magnesium oxide  400 mg Oral Daily  . methylPREDNISolone (SOLU-MEDROL) injection  60 mg Intravenous Q8H  . pantoprazole  40 mg Oral Daily  :  Allergies  Allergen Reactions  . Acetaminophen Other (See Comments)    Very sleepy  . Topamax [Topiramate] Other (See Comments)    Kidney stones  . Penicillins Hives    FEB 2018 - broke out in hives  :   Family History  Problem Relation Age of Onset  . Hypertension Mother   . Diabetes Mellitus II Mother   . Hypertension Father   . Diabetes Mellitus II Father    Social History   Socioeconomic History  . Marital status: Divorced    Spouse name: Not on file  . Number of children: Not on file  . Years of education: Not on file  . Highest education level: Not on file  Social Needs  . Financial resource strain: Not on file  . Food insecurity - worry: Not on file  . Food insecurity - inability: Not on file  . Transportation needs - medical: Not on file  . Transportation needs - non-medical: Not on file  Occupational History  . Not on file  Tobacco Use  . Smoking status: Never Smoker  . Smokeless tobacco: Never Used  Substance and Sexual Activity  . Alcohol use: Yes    Comment: occ  . Drug use: No  . Sexual activity: Yes    Birth control/protection: Surgical  Other Topics Concern  . Not on file  Social History Narrative  . Not on file    Review of Systems: As per HPI.  All other systems are negative  Physical Exam:  Blood pressure (!) 145/70, pulse 77, temperature 98.7 F (37.1 C), temperature source Oral, resp. rate 18, height 5\' 5"  (1.651 m), weight 176 lb 2.4 oz (79.9 kg), last  menstrual period 06/24/2012, SpO2 98 %.  Patient is alert, awake, oriented x3 in no acute distress. HEENT: Anicteric sclera, mucous membranes Lungs: Clear to auscultation bilaterally Cardiac: S1/S2, regular no murmurs.  No lower extremity edema Abdomen: Soft, nontender, nondistended.  No palpable masses or hepatosplenomegaly. Vascular: No peripheral edema or abnormal vascular patterns Lymph nodes: No palpable lymphadenopathy Neurologic: No gross focal neurological deficits Skin: Unremarkable Musculoskeletal: Unremarkable  LABS:  Recent Labs    07/09/17 0239 07/10/17 0454  WBC 10.7* 9.8  HGB 5.8* 9.3*  HCT 20.5* 31.8*  PLT 245 195    Recent Labs    07/09/17 0239 07/10/17 0454  NA 139 141  K 3.4* 4.0  CL 109 114*  CO2 19* 20*  GLUCOSE 135* 186*  BUN 9 10  CREATININE 0.75 0.59  CALCIUM 8.8* 9.3      RADIOLOGY:  Dg Chest 2 View  Result Date: 07/09/2017 CLINICAL DATA:  56 y/o F; cough, congestion, upper back pain, headache. EXAM: CHEST  2 VIEW COMPARISON:  01/17/2013 chest radiograph FINDINGS: Borderline cardiomegaly. Enlarged moderate hiatal hernia. No focal consolidation. No pleural effusion or pneumothorax. No acute osseous abnormality is evident. IMPRESSION: Borderline cardiomegaly. Enlarged moderate hiatal hernia. No acute pulmonary process identified. Electronically Signed   By: Mitzi HansenLance  Furusawa-Stratton M.D.   On: 07/09/2017 00:59   Ct Angio Chest Pe W Or Wo Contrast  Result Date: 07/09/2017 CLINICAL DATA:  Progressively worsening shortness of breath. EXAM: CT ANGIOGRAPHY CHEST WITH CONTRAST TECHNIQUE: Multidetector CT imaging of the chest was performed using the standard protocol during bolus administration of intravenous contrast. Multiplanar CT image reconstructions and MIPs were obtained to evaluate the vascular anatomy. CONTRAST:  100mL ISOVUE-370 IOPAMIDOL (ISOVUE-370) INJECTION 76% COMPARISON:  Chest x-ray earlier today. FINDINGS: Cardiovascular: No filling  defects in the pulmonary arteries to suggest pulmonary emboli. Heart is normal size. Aorta is normal caliber. Mediastinum/Nodes: Large hiatal hernia. No mediastinal, hilar, or axillary adenopathy. Lungs/Pleura: Slight ground-glass densities noted centrally in the upper lobes and in both lower lobes, concerning for early multifocal pneumonia. No effusions. Upper Abdomen: Imaging into the upper abdomen shows no acute findings. Musculoskeletal: Chest wall soft tissues are unremarkable. No acute bony abnormality. Review of the MIP images confirms the above findings. IMPRESSION: No evidence of pulmonary embolus. Large hiatal hernia. Scattered ground-glass and tree-in-bud nodular densities in the central upper lobes and both lower lobes concerning for early multifocal pneumonia. Electronically Signed   By: Charlett NoseKevin  Dover M.D.   On: 07/09/2017 11:26    Assessment and Plan:  56 year old female presenting with symptomatic severe anemia with hemoglobin of 5.8.  Anemia appears to be microcytic, hypochromic, hypoproliferative.  Stools negative for blood x1, but patient does have severe iron deficiency with ferritin of 8 on presentation.  I do not see any previous iron values in the patient's chart, but she has been advised to take iron supplementation in the past.  Anemia appears to have worsened significantly following the total knee replacement surgery conducted in June 2018.  All these findings are consistent with anemia of iron deficiency due to chronic GI blood loss and or insufficient dietary supplementation of iron.  Chronic use of naproxen is also likely a contributor to the presentation.  The abrupt onset of symptoms most likely correlated with additional bone marrow suppression from inflammatory reaction due to a viral infection with dyspnea exacerbated by bronchitis or pneumonitis.  Some response to transfusions in the hospital, patient is not actively bleeding at this time.  Recommendations: --At this time, I  do not recommend any additional hematological workup --GI consultation with outpatient EGD/colonoscopy --Discharge from the hospital at discretion of the primary treating service --Iron supplementation with ferrous sulfate 325mg  PO BID-TID --I would suggest f/u with Hematology as outpatient on discharge for hematological monitoring.  I am personally going to be absent from the clinic for the next 3 weeks, but will be happy to see the patient later in January unless faster follow-up is desired by the patient or the other providers.  In that case, follow-up with one my colleagues at the cancer center would be indicated.    Daisy BlossomMikhail G , MD 07/10/2017, 4:11 PM

## 2017-07-10 NOTE — Progress Notes (Signed)
PROGRESS NOTE  [name] [name]:[id number] DOB: 04-08-1961 DOA: 07/09/2017 PCP: Octavio Graves, DO  HPI/Recap of past 24 hours: [name] [name] is a 56 y.o. female with medical history significant of arthritis, nephrolithiasis, and GERD; who presents early this morning 07/09/17 with complaints of progressively worsening shortness of breath over the last 4 days. Upon presentation Hg 5.8. FOBT negative. Normal renal function. Admitted for symptomatic anemia. Order to transfuse 3U PRBCs.  Hemoptysis reported 07/09/17 am. CTA chest PE NEGATIVE. Pt states she does not know why she was on xarelto and stopped taking more than a year ago.  Virus panel positive for metapneumovirus 07/10/17. Hg 9.8 from 5.8 post 3 U PRBCs. FOBT negative. Anemia workup, peripheral blood smear and hemoncology consult today 07/10/17.  Pt seen and examined at her bedside. She reports still feeling dyspneic upon ambulation but improving. Duonebs scheduled q6H on IV solumedrol. PT to walk and assess tolerance.   Assessment/Plan: Principal Problem:   Symptomatic anemia Active Problems:   SIRS (systemic inflammatory response syndrome) (HCC)   Hypokalemia   Bronchitis   Arthritis   URI (upper respiratory infection)  Symptomatic anemia: Acute.   -Denies any NSAIDs or blood thinner use.  -Hemoglobin noted to be 5.8 on admission, 9.3 on 01/28/2017.   -post 3U PRBCs -hg 9.3 frorm 5.8 -FOBT negative -Anemia work up -Hematology consulted. We appreciate recommendations -CBC am  SIRS/sepsis 2/2 URI metapneumovirus, poa: Acute.   - HR 115 and RR 22 on admission -wbc 12 (07/09/17) -Sepsis protocol initiated on admission - lactic acid 2.4 - respiratory virus panel positive for metapneumovirus - blood cultures no growth 1 day (07/10/17) -U/a negative - IV ceftriaxone day 1 - leukocytosis resolved 9.8 from 12.0 on admission -May consider dc antibiotics in the am. Possible viral   Bronchitis: Acute.  - no previous  history of tobacco use - wheezing on physical exam. - Solu-Medrol Iv 60 mg q 8 hr  - Duonebs q6h for sob/Wheezing  Dyspnea: Acute.  -most likely multifactorial from severe anemia vs acute bronchitis -improving -on room air at this time.  -CTA PE negative  Borderline cardiomegaly:  - Question enlarged heart secondary to patient's anemia. - BNP am. No LE edema  Hypokalemia: Acute.   -resolved -potassium 3.4 on admission. -replete as indicated -BMP am  Arthritis -stable -tylenol prn for mild to moderate pain  Gerd:  -stable -hiatal hernia on chest x-ray. -po Protonix   Code Status: Full  Family Communication: No family member at bedside  Disposition Plan: Will stay another midnight to continue current management.    Consultants:  Hematology  Procedures:  None  Antimicrobials:  IV ceftriaxone day 1  DVT prophylaxis:  lovenox sq 40 mg daily   Objective: Vitals:   07/09/17 1739 07/09/17 2020 07/10/17 0300 07/10/17 0426  BP: (!) 141/66 (!) 144/71  133/64  Pulse: 88 80  79  Resp: 20 18  18   Temp: 99.4 F (37.4 C) 98.4 F (36.9 C) 98.7 F (37.1 C) 98.7 F (37.1 C)  TempSrc: Oral Oral  Oral  SpO2: 99% 100%  97%  Weight:      Height:        Intake/Output Summary (Last 24 hours) at 07/10/2017 1009 Last data filed at 07/10/2017 0833 Gross per 24 hour  Intake 2232.5 ml  Output 1800 ml  Net 432.5 ml   Filed Weights   07/09/17 0140 07/09/17 0521  Weight: 82.1 kg (181 lb) 79.9 kg (176 lb 2.4 oz)  Exam:   General:  56 yo CF WD WN NAD. A&O x 3  Cardiovascular: RRR no murmurs gallops or rubs  Respiratory: CTA with no wheeze or rales  Abdomen: soft NT ND NBS x4  Musculoskeletal: NO lE edma 2/4 pulses in all 4   Skin: No open lesions  Psychiatry: Mood is appropriate for condition and setting   Data Reviewed: CBC: Recent Labs  Lab 07/09/17 0239 07/10/17 0454  WBC 10.7* 9.8  NEUTROABS 9.4*  --   HGB 5.8* 9.3*  HCT 20.5* 31.8*    MCV 75.1* 78.5  PLT 245 176   Basic Metabolic Panel: Recent Labs  Lab 07/09/17 0239 07/09/17 0603 07/10/17 0454  NA 139  --  141  K 3.4*  --  4.0  CL 109  --  114*  CO2 19*  --  20*  GLUCOSE 135*  --  186*  BUN 9  --  10  CREATININE 0.75  --  0.59  CALCIUM 8.8*  --  9.3  MG  --  1.8  --    GFR: Estimated Creatinine Clearance: 81.1 mL/min (by C-G formula based on SCr of 0.59 mg/dL). Liver Function Tests: Recent Labs  Lab 07/09/17 0239  AST 22  ALT 13*  ALKPHOS 106  BILITOT 0.4  PROT 7.1  ALBUMIN 3.9   No results for input(s): LIPASE, AMYLASE in the last 168 hours. No results for input(s): AMMONIA in the last 168 hours. Coagulation Profile: No results for input(s): INR, PROTIME in the last 168 hours. Cardiac Enzymes: No results for input(s): CKTOTAL, CKMB, CKMBINDEX, TROPONINI in the last 168 hours. BNP (last 3 results) No results for input(s): PROBNP in the last 8760 hours. HbA1C: No results for input(s): HGBA1C in the last 72 hours. CBG: No results for input(s): GLUCAP in the last 168 hours. Lipid Profile: No results for input(s): CHOL, HDL, LDLCALC, TRIG, CHOLHDL, LDLDIRECT in the last 72 hours. Thyroid Function Tests: No results for input(s): TSH, T4TOTAL, FREET4, T3FREE, THYROIDAB in the last 72 hours. Anemia Panel: Recent Labs    07/09/17 0603  VITAMINB12 1,284*  FOLATE 34.0  FERRITIN 8*  TIBC 424  IRON 5*  RETICCTPCT 2.0   Urine analysis:    Component Value Date/Time   COLORURINE STRAW (A) 07/09/2017 0650   APPEARANCEUR CLEAR 07/09/2017 0650   LABSPEC 1.005 07/09/2017 0650   PHURINE 6.0 07/09/2017 0650   GLUCOSEU NEGATIVE 07/09/2017 0650   HGBUR NEGATIVE 07/09/2017 0650   BILIRUBINUR NEGATIVE 07/09/2017 0650   KETONESUR NEGATIVE 07/09/2017 0650   PROTEINUR NEGATIVE 07/09/2017 0650   UROBILINOGEN 0.2 11/15/2013 1007   NITRITE NEGATIVE 07/09/2017 0650   LEUKOCYTESUR NEGATIVE 07/09/2017 0650   Sepsis  Labs: @LABRCNTIP (procalcitonin:4,lacticidven:4)  ) Recent Results (from the past 240 hour(s))  Respiratory Panel by PCR     Status: Abnormal   Collection Time: 07/09/17  6:10 AM  Result Value Ref Range Status   Adenovirus NOT DETECTED NOT DETECTED Final   Coronavirus 229E NOT DETECTED NOT DETECTED Final   Coronavirus HKU1 NOT DETECTED NOT DETECTED Final   Coronavirus NL63 NOT DETECTED NOT DETECTED Final   Coronavirus OC43 NOT DETECTED NOT DETECTED Final   Metapneumovirus DETECTED (A) NOT DETECTED Final   Rhinovirus / Enterovirus NOT DETECTED NOT DETECTED Final   Influenza A NOT DETECTED NOT DETECTED Final   Influenza B NOT DETECTED NOT DETECTED Final   Parainfluenza Virus 1 NOT DETECTED NOT DETECTED Final   Parainfluenza Virus 2 NOT DETECTED NOT DETECTED Final  Parainfluenza Virus 3 NOT DETECTED NOT DETECTED Final   Parainfluenza Virus 4 NOT DETECTED NOT DETECTED Final   Respiratory Syncytial Virus NOT DETECTED NOT DETECTED Final   Bordetella pertussis NOT DETECTED NOT DETECTED Final   Chlamydophila pneumoniae NOT DETECTED NOT DETECTED Final   Mycoplasma pneumoniae NOT DETECTED NOT DETECTED Final    Comment: Performed at [facility], [place] [address], [place], [place] [address]  Culture, sputum-assessment     Status: None   Collection Time: 07/09/17  6:10 AM  Result Value Ref Range Status   Specimen Description EXPECTORATED SPUTUM  Final   Special Requests NONE  Final   Sputum evaluation THIS SPECIMEN IS ACCEPTABLE FOR SPUTUM CULTURE  Final   Report Status 07/09/2017 FINAL  Final  Culture, respiratory (NON-Expectorated)     Status: None (Preliminary result)   Collection Time: 07/09/17  6:10 AM  Result Value Ref Range Status   Specimen Description EXPECTORATED SPUTUM  Final   Special Requests NONE Reflexed from [id number]  Final   Gram Stain   Final    FEW WBC PRESENT,BOTH PMN AND MONONUCLEAR MODERATE GRAM POSITIVE COCCI IN PAIRS AND CHAINS FEW GRAM NEGATIVE  COCCOBACILLI RARE GRAM NEGATIVE RODS NO SQUAMOUS EPITHELIAL CELLS PRESENT    Culture   Final    CULTURE REINCUBATED FOR BETTER GROWTH Performed at [facility], [place] [address], [place], [place] [address]    Report Status PENDING  Incomplete      Studies: Ct Angio Chest Pe W Or Wo Contrast  Result Date: 07/09/2017 CLINICAL DATA:  Progressively worsening shortness of breath. EXAM: CT ANGIOGRAPHY CHEST WITH CONTRAST TECHNIQUE: Multidetector CT imaging of the chest was performed using the standard protocol during bolus administration of intravenous contrast. Multiplanar CT image reconstructions and MIPs were obtained to evaluate the vascular anatomy. CONTRAST:  121m ISOVUE-370 IOPAMIDOL (ISOVUE-370) INJECTION 76% COMPARISON:  Chest x-ray earlier today. FINDINGS: Cardiovascular: No filling defects in the pulmonary arteries to suggest pulmonary emboli. Heart is normal size. Aorta is normal caliber. Mediastinum/Nodes: Large hiatal hernia. No mediastinal, hilar, or axillary adenopathy. Lungs/Pleura: Slight ground-glass densities noted centrally in the upper lobes and in both lower lobes, concerning for early multifocal pneumonia. No effusions. Upper Abdomen: Imaging into the upper abdomen shows no acute findings. Musculoskeletal: Chest wall soft tissues are unremarkable. No acute bony abnormality. Review of the MIP images confirms the above findings. IMPRESSION: No evidence of pulmonary embolus. Large hiatal hernia. Scattered ground-glass and tree-in-bud nodular densities in the central upper lobes and both lower lobes concerning for early multifocal pneumonia. Electronically Signed   By: KRolm BaptiseM.D.   On: 07/09/2017 11:26    Scheduled Meds: . ferrous sulfate  325 mg Oral Q breakfast  . gabapentin  800 mg Oral QHS  . guaiFENesin  600 mg Oral BID  . magnesium oxide  400 mg Oral Daily  . methylPREDNISolone (SOLU-MEDROL) injection  60 mg Intravenous Q8H  . pantoprazole  40 mg Oral Daily     Continuous Infusions: . cefTRIAXone (ROCEPHIN)  IV Stopped (07/09/17 1243)     LOS: 1 day     CKayleen Memos MD [facility] Pager [phone] If 7PM-7AM, please contact night-coverage [email or web address] Password [email or web address] 07/10/2017, 10:09 AM

## 2017-07-10 NOTE — Evaluation (Signed)
Physical Therapy Evaluation Patient Details Name: [name] [name] MRN: [id number] DOB: October 29, 1960 Today's Date: 07/10/2017   History of Present Illness  [name] [name] is a 56 y.o. female with medical history significant of arthritis, nephrolithiasis, and GERD; who presents with complaints of progressively worsening shortness of breath over the last 4 days  Clinical Impression  Patient evaluated by Physical Therapy with no further acute PT needs identified. All education has been completed and the patient has no further questions.  See below for any follow-up Physical Therapy or equipment needs. PT is signing off. Thank you for this referral.     Follow Up Recommendations No PT follow up    Equipment Recommendations  None recommended by PT    Recommendations for Other Services       Precautions / Restrictions Precautions Precautions: Fall      Mobility  Bed Mobility Overal bed mobility: Modified Independent                Transfers Overall transfer level: Modified independent                  Ambulation/Gait Ambulation/Gait assistance: Modified independent (Device/Increase time) Ambulation Distance (Feet): 300 Feet Assistive device: None Gait Pattern/deviations: Step-through pattern;Trendelenburg     General Gait Details: pt with slight trendelenberg, baseline diffiulty with R knee and hip limitations d/t OA; no LOB   Stairs            Wheelchair Mobility    Modified Rankin (Stroke Patients Only)       Balance Overall balance assessment: Needs assistance   Sitting balance-Leahy Scale: Normal       Standing balance-Leahy Scale: Good               High level balance activites: Backward walking;Direction changes;Turns;Head turns High Level Balance Comments: no LOB with above             Pertinent Vitals/Pain      Home Living Family/patient expects to be discharged to:: Private residence Living Arrangements:  Alone Available Help at Discharge: Family Type of Home: Mobile home Home Access: Stairs to enter Entrance Stairs-Rails: Right;Left;Can reach both Entrance Stairs-Number of Steps: 5-6   Home Equipment: Walker - 2 wheels;Crutches;Cane - single point      Prior Function Level of Independence: Independent         Comments: very active, drives truck     Hand Dominance        Extremity/Trunk Assessment   Upper Extremity Assessment Upper Extremity Assessment: Overall WFL for tasks assessed    Lower Extremity Assessment Lower Extremity Assessment: Overall WFL for tasks assessed       Communication   Communication: No difficulties(very talkative)  Cognition Arousal/Alertness: Awake/alert Behavior During Therapy: WFL for tasks assessed/performed Overall Cognitive Status: Within Functional Limits for tasks assessed                                        General Comments      Exercises     Assessment/Plan    PT Assessment Patent does not need any further PT services  PT Problem List         PT Treatment Interventions      PT Goals (Current goals can be found in the Care Plan section)  Acute Rehab PT Goals PT Goal Formulation: All assessment and education complete, DC therapy  Frequency     Barriers to discharge        Co-evaluation               AM-PAC PT "6 Clicks" Daily Activity  Outcome Measure Difficulty turning over in bed (including adjusting bedclothes, sheets and blankets)?: None Difficulty moving from lying on back to sitting on the side of the bed? : None Difficulty sitting down on and standing up from a chair with arms (e.g., wheelchair, bedside commode, etc,.)?: None Help needed moving to and from a bed to chair (including a wheelchair)?: None Help needed walking in hospital room?: None Help needed climbing 3-5 steps with a railing? : None 6 Click Score: 24    End of Session   Activity Tolerance: Patient  tolerated treatment well Patient left: in chair;with call bell/phone within reach   PT Visit Diagnosis: Difficulty in walking, not elsewhere classified (R26.2)    Time: [id number] PT Time Calculation (min) (ACUTE ONLY): 24 min   Charges:   PT Evaluation $PT Eval Low Complexity: 1 Low PT Treatments $Gait Training: 8-22 mins   PT G Codes:          , 07/10/2017, 3:25 PM

## 2017-07-10 NOTE — Plan of Care (Signed)
  Progressing Education: Knowledge of General Education information will improve 07/10/2017 1352 - Progressing by Epimenio FootGurley,  D, RN Clinical Measurements: Respiratory complications will improve 07/10/2017 1352 - Progressing by Epimenio FootGurley,  D, RN

## 2017-07-11 DIAGNOSIS — D649 Anemia, unspecified: Secondary | ICD-10-CM

## 2017-07-11 LAB — BRAIN NATRIURETIC PEPTIDE: B NATRIURETIC PEPTIDE 5: 200.1 pg/mL — AB (ref 0.0–100.0)

## 2017-07-11 LAB — CBC
HCT: 32 % — ABNORMAL LOW (ref 36.0–46.0)
Hemoglobin: 9.5 g/dL — ABNORMAL LOW (ref 12.0–15.0)
MCH: 23.2 pg — ABNORMAL LOW (ref 26.0–34.0)
MCHC: 29.7 g/dL — AB (ref 30.0–36.0)
MCV: 78.2 fL (ref 78.0–100.0)
Platelets: 207 10*3/uL (ref 150–400)
RBC: 4.09 MIL/uL (ref 3.87–5.11)
RDW: 18.5 % — ABNORMAL HIGH (ref 11.5–15.5)
WBC: 9.7 10*3/uL (ref 4.0–10.5)

## 2017-07-11 LAB — CULTURE, RESPIRATORY: CULTURE: NORMAL

## 2017-07-11 LAB — BASIC METABOLIC PANEL
Anion gap: 7 (ref 5–15)
BUN: 17 mg/dL (ref 6–20)
CO2: 21 mmol/L — ABNORMAL LOW (ref 22–32)
CREATININE: 0.57 mg/dL (ref 0.44–1.00)
Calcium: 9.2 mg/dL (ref 8.9–10.3)
Chloride: 113 mmol/L — ABNORMAL HIGH (ref 101–111)
GFR calc non Af Amer: >60 mL/min (ref >60)
Glucose, Bld: 170 mg/dL — ABNORMAL HIGH (ref 65–99)
Potassium: 4.5 mmol/L (ref 3.5–5.1)
SODIUM: 141 mmol/L (ref 135–145)

## 2017-07-11 LAB — CULTURE, RESPIRATORY W GRAM STAIN

## 2017-07-11 LAB — LEGIONELLA PNEUMOPHILA SEROGP 1 UR AG: L. pneumophila Serogp 1 Ur Ag: NEGATIVE

## 2017-07-11 MED ORDER — METHYLPREDNISOLONE SODIUM SUCC 40 MG IJ SOLR
40.0000 mg | Freq: Every day | INTRAMUSCULAR | Status: DC
  Administered 2017-07-12: 40 mg via INTRAVENOUS
  Filled 2017-07-11: qty 1

## 2017-07-11 MED ORDER — IPRATROPIUM-ALBUTEROL 0.5-2.5 (3) MG/3ML IN SOLN: 3.0000 mL | RESPIRATORY_TRACT | Status: DC | PRN

## 2017-07-11 MED ORDER — IPRATROPIUM-ALBUTEROL 0.5-2.5 (3) MG/3ML IN SOLN
3.0000 mL | Freq: Three times a day (TID) | RESPIRATORY_TRACT | Status: DC
  Administered 2017-07-11: 3 mL via RESPIRATORY_TRACT
  Filled 2017-07-11: qty 3

## 2017-07-11 NOTE — Progress Notes (Signed)
Patient ID: [name] [name], female   DOB: [birth year], 56 y.o.   MRN: [id number]  PROGRESS NOTE    KIRI HINDERLITER  WUJ:[id number] DOB: [birth year] DOA: 07/09/2017  PCP: Samuel Jester, DO   Brief Narrative:  56 y.o.femalewith medical history significant ofarthritis,nephrolithiasis,and GERD;who presents early this morning 07/09/17 with complaints of progressively worsening shortness of breath over the last 4 days. Upon presentation Hg 5.8. FOBT negative. Normal renal function. Admitted for symptomatic anemia. Order to transfuse 3U PRBCs.  Hemoptysis reported 07/09/17 am. CTA chest PE NEGATIVE. Pt states she does not know why she was on xarelto and stopped taking more than a year ago.  Virus panel positive for metapneumovirus 07/10/17. Hg 9.8 from 5.8 post 3 U PRBCs. FOBT negative. Anemia workup, peripheral blood smear and hemoncology consult 07/10/17.   Assessment & Plan:   Symptomatic anemia:Acute.  -Denies any NSAIDs or blood thinner use.  -Hemoglobin noted to be 5.8 on admission, 9.3on 01/28/2017.  -post 3U PRBCs -hg 9.3 frorm 5.8 -FOBT negative - Hematology recommendations apprecaited - Hgb stable this am - D/C Lovenox subQ and use SCD's for DVT prophylaxis   SIRS/sepsis2/2 URI metapneumovirus, NFA:OZHYQ.  -HR 115 and RR 22 on admission -wbc 12 (07/09/17) -Sepsis protocol initiated on admission -lactic acid 2.4 - UA negative  -respiratory virus panel positive for metapneumovirus - Blood cx negative  - Pt on rocephin so will stop it since most likely viral pneumonia   Acute respiratory failure without hypoxia / Acute bronchitis  -most likely multifactorial from severe anemia vs acute bronchitis - CT angio negative for PE - Stable resp status  - Taper down steroids from Q 8 hours to Q 24 hours  - Continue current bronchodilator treatment   Borderline cardiomegaly: - Question enlarged heart secondary to patient's anemia. - No swelling in LE - Stable  resp status   Acute hypokalemia - Resolved    Arthritis - Continue symptomatic management  - Tylenol PRN  Gerd: - Continue protonix   DVT prophylaxis: SCD's  Code Status: full code  Family Communication: no family at the bedside  Disposition Plan: home likely in next 48 hours    Consultants:   PT  Hematology   Procedures:   3 U PRBC transfusion   Antimicrobials:   Rocephin -->   Subjective: Coughing and sneezing.   Objective: Vitals:   07/10/17 1500 07/10/17 1938 07/10/17 2024 07/11/17 0421  BP: (!) 145/70  134/69 (!) 153/72  Pulse: 77  92 80  Resp: 18  18 18   Temp: 98.7 F (37.1 C)  99.8 F (37.7 C) 99.1 F (37.3 C)  TempSrc: Oral  Oral Oral  SpO2: 98% 98% 98% 98%  Weight:      Height:        Intake/Output Summary (Last 24 hours) at 07/11/2017 0857 Last data filed at 07/11/2017 0425 Gross per 24 hour  Intake 650 ml  Output 1850 ml  Net -1200 ml   Filed Weights   07/09/17 0140 07/09/17 0521  Weight: 82.1 kg (181 lb) 79.9 kg (176 lb 2.4 oz)    Examination:  General exam: Appears calm and comfortable  Respiratory system: wheezing in upper lung lobes, no stridor, no rhonchi  Cardiovascular system: S1 & S2 heard, Rate controlled  Gastrointestinal system: Abdomen is nondistended, soft and nontender. No organomegaly or masses felt. Normal bowel sounds heard. Central nervous system: Alert and oriented. No focal neurological deficits. Extremities: Symmetric 5 x 5 power. Skin: No rashes, lesions or ulcers Psychiatry:  Judgement and insight appear normal. Mood & affect appropriate.   Data Reviewed: I have personally reviewed following labs and imaging studies  CBC: Recent Labs  Lab 07/09/17 0239 07/10/17 0454 07/11/17 0458  WBC 10.7* 9.8 9.7  NEUTROABS 9.4*  --   --   HGB 5.8* 9.3* 9.5*  HCT 20.5* 31.8* 32.0*  MCV 75.1* 78.5 78.2  PLT 245 195 207   Basic Metabolic Panel: Recent Labs  Lab 07/09/17 0239 07/09/17 0603 07/10/17 0454  07/11/17 0458  NA 139  --  141 141  K 3.4*  --  4.0 4.5  CL 109  --  114* 113*  CO2 19*  --  20* 21*  GLUCOSE 135*  --  186* 170*  BUN 9  --  10 17  CREATININE 0.75  --  0.59 0.57  CALCIUM 8.8*  --  9.3 9.2  MG  --  1.8  --   --    GFR: Estimated Creatinine Clearance: 81.1 mL/min (by C-G formula based on SCr of 0.57 mg/dL). Liver Function Tests: Recent Labs  Lab 07/09/17 0239  AST 22  ALT 13*  ALKPHOS 106  BILITOT 0.4  PROT 7.1  ALBUMIN 3.9   No results for input(s): LIPASE, AMYLASE in the last 168 hours. No results for input(s): AMMONIA in the last 168 hours. Coagulation Profile: No results for input(s): INR, PROTIME in the last 168 hours. Cardiac Enzymes: No results for input(s): CKTOTAL, CKMB, CKMBINDEX, TROPONINI in the last 168 hours. BNP (last 3 results) No results for input(s): PROBNP in the last 8760 hours. HbA1C: No results for input(s): HGBA1C in the last 72 hours. CBG: No results for input(s): GLUCAP in the last 168 hours. Lipid Profile: No results for input(s): CHOL, HDL, LDLCALC, TRIG, CHOLHDL, LDLDIRECT in the last 72 hours. Thyroid Function Tests: No results for input(s): TSH, T4TOTAL, FREET4, T3FREE, THYROIDAB in the last 72 hours. Anemia Panel: Recent Labs    07/09/17 0603 07/10/17 1036  VITAMINB12 1,284* 1,396*  FOLATE 34.0  --   FERRITIN 8* 15  TIBC 424 396  IRON 5* 53  RETICCTPCT 2.0 1.2   Urine analysis:    Component Value Date/Time   COLORURINE STRAW (A) 07/09/2017 0650   APPEARANCEUR CLEAR 07/09/2017 0650   LABSPEC 1.005 07/09/2017 0650   PHURINE 6.0 07/09/2017 0650   GLUCOSEU NEGATIVE 07/09/2017 0650   HGBUR NEGATIVE 07/09/2017 0650   BILIRUBINUR NEGATIVE 07/09/2017 0650   KETONESUR NEGATIVE 07/09/2017 0650   PROTEINUR NEGATIVE 07/09/2017 0650   UROBILINOGEN 0.2 11/15/2013 1007   NITRITE NEGATIVE 07/09/2017 0650   LEUKOCYTESUR NEGATIVE 07/09/2017 0650   Sepsis Labs: @LABRCNTIP (procalcitonin:4,lacticidven:4)    Recent  Results (from the past 240 hour(s))  Culture, blood (x 2)     Status: None (Preliminary result)   Collection Time: 07/09/17  6:03 AM  Result Value Ref Range Status   Specimen Description BLOOD RIGHT HAND  Final   Special Requests IN PEDIATRIC BOTTLE Blood Culture adequate volume  Final   Culture   Final    NO GROWTH 1 DAY Performed at [facility], [address] [place], [place] [address]    Report Status PENDING  Incomplete  Culture, blood (x 2)     Status: None (Preliminary result)   Collection Time: 07/09/17  6:03 AM  Result Value Ref Range Status   Specimen Description BLOOD LEFT HAND  Final   Special Requests IN PEDIATRIC BOTTLE Blood Culture adequate volume  Final   Culture  Final    NO GROWTH 1 DAY Performed at [facility], [address] [place], [place] [address]    Report Status PENDING  Incomplete  Respiratory Panel by PCR     Status: Abnormal   Collection Time: 07/09/17  6:10 AM  Result Value Ref Range Status   Adenovirus NOT DETECTED NOT DETECTED Final   Coronavirus 229E NOT DETECTED NOT DETECTED Final   Coronavirus HKU1 NOT DETECTED NOT DETECTED Final   Coronavirus NL63 NOT DETECTED NOT DETECTED Final   Coronavirus OC43 NOT DETECTED NOT DETECTED Final   Metapneumovirus DETECTED (A) NOT DETECTED Final   Rhinovirus / Enterovirus NOT DETECTED NOT DETECTED Final   Influenza A NOT DETECTED NOT DETECTED Final   Influenza B NOT DETECTED NOT DETECTED Final   Parainfluenza Virus 1 NOT DETECTED NOT DETECTED Final   Parainfluenza Virus 2 NOT DETECTED NOT DETECTED Final   Parainfluenza Virus 3 NOT DETECTED NOT DETECTED Final   Parainfluenza Virus 4 NOT DETECTED NOT DETECTED Final   Respiratory Syncytial Virus NOT DETECTED NOT DETECTED Final   Bordetella pertussis NOT DETECTED NOT DETECTED Final   Chlamydophila pneumoniae NOT DETECTED NOT DETECTED Final   Mycoplasma pneumoniae NOT DETECTED NOT DETECTED Final    Comment: Performed at [facility],  [address] [place], [place] [address]  Culture, sputum-assessment     Status: None   Collection Time: 07/09/17  6:10 AM  Result Value Ref Range Status   Specimen Description EXPECTORATED SPUTUM  Final   Special Requests NONE  Final   Sputum evaluation THIS SPECIMEN IS ACCEPTABLE FOR SPUTUM CULTURE  Final   Report Status 07/09/2017 FINAL  Final  Culture, respiratory (NON-Expectorated)     Status: None (Preliminary result)   Collection Time: 07/09/17  6:10 AM  Result Value Ref Range Status   Specimen Description EXPECTORATED SPUTUM  Final   Special Requests NONE Reflexed from [id number]  Final   Gram Stain   Final    FEW WBC PRESENT,BOTH PMN AND MONONUCLEAR MODERATE GRAM POSITIVE COCCI IN PAIRS AND CHAINS FEW GRAM NEGATIVE COCCOBACILLI RARE GRAM NEGATIVE RODS NO SQUAMOUS EPITHELIAL CELLS PRESENT    Culture   Final    CULTURE REINCUBATED FOR BETTER GROWTH Performed at [facility], [address] [place], [place] [address]    Report Status PENDING  Incomplete      Radiology Studies: Dg Chest 2 View Result Date: 07/09/2017 Borderline cardiomegaly. Enlarged moderate hiatal hernia. No acute pulmonary process identified.  Ct Angio Chest Pe W Or Wo Contrast Result Date: 07/09/2017 No evidence of pulmonary embolus. Large hiatal hernia. Scattered ground-glass and tree-in-bud nodular densities in the central upper lobes and both lower lobes concerning for early multifocal pneumonia.     Scheduled Meds: . ferrous sulfate  325 mg Oral BID WC  . gabapentin  800 mg Oral QHS  . guaiFENesin  600 mg Oral BID  . ipratropium-albuterol  3 mL Nebulization TID  . magnesium oxide  400 mg Oral Daily  . methylPREDNISolone (SOLU-MEDROL) injection  60 mg Intravenous Q8H  . pantoprazole  40 mg Oral Daily   Continuous Infusions: . cefTRIAXone (ROCEPHIN)  IV Stopped (07/10/17 1203)     LOS: 2 days    Time spent: 25 minutes  Greater than 50% of the time spent on counseling and coordinating the  care.   Manson PasseyAlma , MD [facility] Pager [phone]  If 7PM-7AM, please contact night-coverage [email or web address] Password [email or web address] 07/11/2017, 8:57 AM

## 2017-07-12 ENCOUNTER — Ambulatory Visit: Payer: Self-pay | Admitting: Orthopedic Surgery

## 2017-07-12 LAB — CBC
HCT: 33.1 % — ABNORMAL LOW (ref 36.0–46.0)
HEMOGLOBIN: 10 g/dL — AB (ref 12.0–15.0)
MCH: 24.2 pg — ABNORMAL LOW (ref 26.0–34.0)
MCHC: 30.2 g/dL (ref 30.0–36.0)
MCV: 80.1 fL (ref 78.0–100.0)
Platelets: 242 10*3/uL (ref 150–400)
RBC: 4.13 MIL/uL (ref 3.87–5.11)
RDW: 19.1 % — ABNORMAL HIGH (ref 11.5–15.5)
WBC: 10.3 10*3/uL (ref 4.0–10.5)

## 2017-07-12 LAB — BASIC METABOLIC PANEL
Anion gap: 9 (ref 5–15)
BUN: 19 mg/dL (ref 6–20)
CALCIUM: 8.8 mg/dL — AB (ref 8.9–10.3)
CHLORIDE: 107 mmol/L (ref 101–111)
CO2: 22 mmol/L (ref 22–32)
CREATININE: 0.62 mg/dL (ref 0.44–1.00)
Glucose, Bld: 108 mg/dL — ABNORMAL HIGH (ref 65–99)
Potassium: 4.1 mmol/L (ref 3.5–5.1)
SODIUM: 138 mmol/L (ref 135–145)

## 2017-07-12 LAB — LACTIC ACID, PLASMA: LACTIC ACID, VENOUS: 1.3 mmol/L (ref 0.5–1.9)

## 2017-07-12 MED ORDER — IPRATROPIUM-ALBUTEROL 0.5-2.5 (3) MG/3ML IN SOLN: 3.0000 mL | Freq: Four times a day (QID) | RESPIRATORY_TRACT | 0 refills | Status: DC | PRN

## 2017-07-12 MED ORDER — LEVOFLOXACIN 750 MG PO TABS: 750.0000 mg | ORAL_TABLET | Freq: Every day | ORAL | 0 refills | Status: DC

## 2017-07-12 NOTE — Discharge Summary (Signed)
Physician Discharge Summary  [name] [name] NWG:[id number] DOB: Sep 25, 1960 DOA: 07/09/2017  PCP: Samuel Jester, DO  Admit date: 07/09/2017 Discharge date: 07/12/2017  Recommendations for Outpatient Follow-up:  1. Continue Levaquin for 4 days on discharge   Discharge Diagnoses:  Principal Problem:   Symptomatic anemia Active Problems:   SIRS (systemic inflammatory response syndrome) (HCC)   Hypokalemia   Bronchitis   Arthritis   URI (upper respiratory infection)   Iron deficiency anemia due to chronic blood loss    Discharge Condition: stable   Diet recommendation: as tolerated   History of present illness:  56 y.o.femalewith medical history significant ofarthritis,nephrolithiasis,and GERD;who presents early this morning 07/09/17 with complaints of progressively worsening shortness of breath over the last 4 days. Upon presentation Hg 5.8. FOBT negative. Normal renal function. Admitted for symptomatic anemia. Order to transfuse 3U PRBCs.  Hemoptysis reported12/6/18am. CTA chest PE NEGATIVE. Ptstates she does not know why she was on xarelto and stopped taking more than a year ago.  Virus panel positive for metapneumovirus 07/10/17. Hg 9.8 from 5.8 post 3 U PRBCs. FOBT negative. Anemia workup, peripheral blood smear and hemoncology consult 07/10/17.  Hospital Course:  Symptomatic anemia:Acute. -Denies any NSAIDs or blood thinner use. -Hemoglobin noted to be 5.8 on admission, 9.3on 01/28/2017. -post 3U PRBCs - Hgb stable, 10 .0 this am   SIRS/sepsis2/2 URImetapneumovirus, VHQ:IONGE.  -HR 115 and RR 22 on admission -wbc 12 (07/09/17) -Sepsis protocol initiatedon admission -lactic acid2.4 - UA negative  -respiratory virus panelpositive for metapneumovirus - Blood cx negative - Levaquin on discharge for 4 days - Stop rocephin   Acute respiratory failure without hypoxia / Acute bronchitis  -most likely multifactorial from severe anemia vs acute  bronchitis - CT angio negative for PE - Order placed for DMR neb machine and prescriptions given for neb meds to be taken as needed for shortness of breath or wheezing   Borderline cardiomegaly: - Question enlarged heart secondary to patient's anemia. - No swelling in LE - Stable   Acute hypokalemia - Resolved    Arthritis - Continue PRN tylenol   Gerd: - Continue protonix   DVT prophylaxis: SCD's Code Status: full code  Family Communication: no family at the bedside    Consultants:   PT  Hematology   Procedures:   3 U PRBC transfusion   Antimicrobials:   Rocephin --> 12/9     Signed:  Manson Passey, MD  [facility] 07/12/2017, 11:10 AM  Pager #: [phone]  Time spent in minutes: more than 30 minutes   Discharge Exam: Vitals:   07/11/17 2213 07/12/17 0652  BP: 132/75 134/65  Pulse: 66 62  Resp: 18 18  Temp: 98.6 F (37 C) (!) 97.5 F (36.4 C)  SpO2: 98% 98%   Vitals:   07/11/17 0915 07/11/17 1305 07/11/17 2213 07/12/17 0652  BP:  131/67 132/75 134/65  Pulse:  87 66 62  Resp:  18 18 18   Temp:  98.5 F (36.9 C) 98.6 F (37 C) (!) 97.5 F (36.4 C)  TempSrc:  Oral Oral Oral  SpO2: 98% 98% 98% 98%  Weight:      Height:        General: Pt is alert, follows commands appropriately, not in acute distress Cardiovascular: Regular rate and rhythm, S1/S2 +, no murmurs Respiratory: Clear to auscultation bilaterally, no wheezing, no crackles, no rhonchi Abdominal: Soft, non tender, non distended, bowel sounds +, no guarding Extremities: no edema, no cyanosis, pulses palpable bilaterally DP and PT  Neuro: Grossly nonfocal  Discharge Instructions  Discharge Instructions    Diet - low sodium heart healthy   Complete by:  As directed    Diet - low sodium heart healthy   Complete by:  As directed    Discharge instructions   Complete by:  As directed    Take Levaquin  for 4 days on discharge  Use duoneb as needed every 4-5  hours for shortness of breath or wheezing   Increase activity slowly   Complete by:  As directed    Increase activity slowly   Complete by:  As directed      Allergies as of 07/12/2017      Reactions   Acetaminophen Other (See Comments)   Very sleepy   Topamax [topiramate] Other (See Comments)   Kidney stones   Penicillins Hives   FEB 2018 - broke out in hives      Medication List    TAKE these medications   ferrous sulfate 325 (65 FE) MG tablet Take 325 mg by mouth daily with breakfast.   gabapentin 300 MG capsule Commonly known as:  NEURONTIN Take 1 capsule (300 mg total) by mouth 3 (three) times daily. What changed:  how much to take   ipratropium-albuterol 0.5-2.5 (3) MG/3ML Soln Commonly known as:  DUONEB Take 3 mLs by nebulization every 6 (six) hours as needed.   LASIX 20 MG tablet Generic drug:  furosemide Take 20 mg by mouth daily as needed for edema.   levofloxacin 750 MG tablet Commonly known as:  LEVAQUIN Take 1 tablet (750 mg total) by mouth daily.   magnesium oxide 400 MG tablet Commonly known as:  MAG-OX Take 400 mg by mouth daily.   methocarbamol 500 MG tablet Commonly known as:  ROBAXIN Take 1 tablet (500 mg total) by mouth every 6 (six) hours as needed for muscle spasms.   NEXIUM 20 MG capsule Generic drug:  esomeprazole Take 20 mg by mouth daily.   NORCO 10-325 MG tablet Generic drug:  HYDROcodone-acetaminophen Take 1 tablet by mouth 4 (four) times daily as needed for pain.   oxyCODONE 5 MG immediate release tablet Commonly known as:  Oxy IR/ROXICODONE Take 1-3 tablets (5-15 mg total) by mouth every 4 (four) hours as needed for moderate pain or severe pain.   POTASSIUM PO Take 1 tablet by mouth daily.   rivaroxaban 10 MG Tabs tablet Commonly known as:  XARELTO Take 1 tablet (10 mg total) by mouth daily with breakfast. Take Xarelto for two and a half more weeks following discharge from the hospital, then discontinue Xarelto. Once the  patient has completed the blood thinner regimen, then take a Baby 81 mg Aspirin daily for three more weeks.   traMADol 50 MG tablet Commonly known as:  ULTRAM Take 1-2 tablets (50-100 mg total) by mouth every 6 (six) hours as needed for moderate pain.            Durable Medical Equipment  (From admission, onward)        Start     Ordered   07/12/17 1103  For home use only DME Nebulizer machine  Once    Question:  Patient needs a nebulizer to treat with the following condition  Answer:  Bronchitis   07/12/17 1103      Follow-up Information    Samuel JesterButler, Cynthia, DO. Schedule an appointment as soon as possible for a visit in 1 week(s).   Contact information: [address] [place] [address] [phone]  The results of significant diagnostics from this hospitalization (including imaging, microbiology, ancillary and laboratory) are listed below for reference.    Significant Diagnostic Studies: Dg Chest 2 View  Result Date: 07/09/2017 CLINICAL DATA:  56 y/o F; cough, congestion, upper back pain, headache. EXAM: CHEST  2 VIEW COMPARISON:  01/17/2013 chest radiograph FINDINGS: Borderline cardiomegaly. Enlarged moderate hiatal hernia. No focal consolidation. No pleural effusion or pneumothorax. No acute osseous abnormality is evident. IMPRESSION: Borderline cardiomegaly. Enlarged moderate hiatal hernia. No acute pulmonary process identified. Electronically Signed   By: Mitzi HansenLance  Furusawa-Stratton M.D.   On: 07/09/2017 00:59   Ct Angio Chest Pe W Or Wo Contrast  Result Date: 07/09/2017 CLINICAL DATA:  Progressively worsening shortness of breath. EXAM: CT ANGIOGRAPHY CHEST WITH CONTRAST TECHNIQUE: Multidetector CT imaging of the chest was performed using the standard protocol during bolus administration of intravenous contrast. Multiplanar CT image reconstructions and MIPs were obtained to evaluate the vascular anatomy. CONTRAST:  100mL ISOVUE-370 IOPAMIDOL (ISOVUE-370)  INJECTION 76% COMPARISON:  Chest x-ray earlier today. FINDINGS: Cardiovascular: No filling defects in the pulmonary arteries to suggest pulmonary emboli. Heart is normal size. Aorta is normal caliber. Mediastinum/Nodes: Large hiatal hernia. No mediastinal, hilar, or axillary adenopathy. Lungs/Pleura: Slight ground-glass densities noted centrally in the upper lobes and in both lower lobes, concerning for early multifocal pneumonia. No effusions. Upper Abdomen: Imaging into the upper abdomen shows no acute findings. Musculoskeletal: Chest wall soft tissues are unremarkable. No acute bony abnormality. Review of the MIP images confirms the above findings. IMPRESSION: No evidence of pulmonary embolus. Large hiatal hernia. Scattered ground-glass and tree-in-bud nodular densities in the central upper lobes and both lower lobes concerning for early multifocal pneumonia. Electronically Signed   By: Charlett NoseKevin  Dover M.D.   On: 07/09/2017 11:26    Microbiology: Recent Results (from the past 240 hour(s))  Culture, blood (x 2)     Status: None (Preliminary result)   Collection Time: 07/09/17  6:03 AM  Result Value Ref Range Status   Specimen Description BLOOD RIGHT HAND  Final   Special Requests IN PEDIATRIC BOTTLE Blood Culture adequate volume  Final   Culture   Final    NO GROWTH 2 DAYS Performed at [facility], [address] [place], [place] [address]    Report Status PENDING  Incomplete  Culture, blood (x 2)     Status: None (Preliminary result)   Collection Time: 07/09/17  6:03 AM  Result Value Ref Range Status   Specimen Description BLOOD LEFT HAND  Final   Special Requests IN PEDIATRIC BOTTLE Blood Culture adequate volume  Final   Culture   Final    NO GROWTH 2 DAYS Performed at [facility], [address] [place], [place] [address]    Report Status PENDING  Incomplete  Respiratory Panel by PCR     Status: Abnormal   Collection Time: 07/09/17  6:10 AM  Result Value Ref Range Status    Adenovirus NOT DETECTED NOT DETECTED Final   Coronavirus 229E NOT DETECTED NOT DETECTED Final   Coronavirus HKU1 NOT DETECTED NOT DETECTED Final   Coronavirus NL63 NOT DETECTED NOT DETECTED Final   Coronavirus OC43 NOT DETECTED NOT DETECTED Final   Metapneumovirus DETECTED (A) NOT DETECTED Final   Rhinovirus / Enterovirus NOT DETECTED NOT DETECTED Final   Influenza A NOT DETECTED NOT DETECTED Final   Influenza B NOT DETECTED NOT DETECTED Final   Parainfluenza Virus 1 NOT DETECTED NOT DETECTED Final   Parainfluenza  Virus 2 NOT DETECTED NOT DETECTED Final   Parainfluenza Virus 3 NOT DETECTED NOT DETECTED Final   Parainfluenza Virus 4 NOT DETECTED NOT DETECTED Final   Respiratory Syncytial Virus NOT DETECTED NOT DETECTED Final   Bordetella pertussis NOT DETECTED NOT DETECTED Final   Chlamydophila pneumoniae NOT DETECTED NOT DETECTED Final   Mycoplasma pneumoniae NOT DETECTED NOT DETECTED Final    Comment: Performed at [facility], [address], [place], [place] [address]  Culture, sputum-assessment     Status: None   Collection Time: 07/09/17  6:10 AM  Result Value Ref Range Status   Specimen Description EXPECTORATED SPUTUM  Final   Special Requests NONE  Final   Sputum evaluation THIS SPECIMEN IS ACCEPTABLE FOR SPUTUM CULTURE  Final   Report Status 07/09/2017 FINAL  Final  Culture, respiratory (NON-Expectorated)     Status: None   Collection Time: 07/09/17  6:10 AM  Result Value Ref Range Status   Specimen Description EXPECTORATED SPUTUM  Final   Special Requests NONE Reflexed from [id number]  Final   Gram Stain   Final    FEW WBC PRESENT,BOTH PMN AND MONONUCLEAR MODERATE GRAM POSITIVE COCCI IN PAIRS AND CHAINS FEW GRAM NEGATIVE COCCOBACILLI RARE GRAM NEGATIVE RODS NO SQUAMOUS EPITHELIAL CELLS PRESENT    Culture   Final    Consistent with normal respiratory flora. Performed at [facility], [address], [place], [place] [address]    Report Status 07/11/2017  FINAL  Final     Labs: Basic Metabolic Panel: Recent Labs  Lab 07/09/17 0239 07/09/17 0603 07/10/17 0454 07/11/17 0458 07/12/17 0440  NA 139  --  141 141 138  K 3.4*  --  4.0 4.5 4.1  CL 109  --  114* 113* 107  CO2 19*  --  20* 21* 22  GLUCOSE 135*  --  186* 170* 108*  BUN 9  --  10 17 19   CREATININE 0.75  --  0.59 0.57 0.62  CALCIUM 8.8*  --  9.3 9.2 8.8*  MG  --  1.8  --   --   --    Liver Function Tests: Recent Labs  Lab 07/09/17 0239  AST 22  ALT 13*  ALKPHOS 106  BILITOT 0.4  PROT 7.1  ALBUMIN 3.9   No results for input(s): LIPASE, AMYLASE in the last 168 hours. No results for input(s): AMMONIA in the last 168 hours. CBC: Recent Labs  Lab 07/09/17 0239 07/10/17 0454 07/11/17 0458 07/12/17 0440  WBC 10.7* 9.8 9.7 10.3  NEUTROABS 9.4*  --   --   --   HGB 5.8* 9.3* 9.5* 10.0*  HCT 20.5* 31.8* 32.0* 33.1*  MCV 75.1* 78.5 78.2 80.1  PLT 245 195 207 242   Cardiac Enzymes: No results for input(s): CKTOTAL, CKMB, CKMBINDEX, TROPONINI in the last 168 hours. BNP: BNP (last 3 results) Recent Labs    07/09/17 0239 07/11/17 0458  BNP 80.9 200.1*    ProBNP (last 3 results) No results for input(s): PROBNP in the last 8760 hours.  CBG: No results for input(s): GLUCAP in the last 168 hours.

## 2017-07-12 NOTE — Discharge Instructions (Signed)

## 2017-07-12 NOTE — Care Management Note (Signed)
Case Management Note  Patient Details  Name: [name] PassyGinger S Ducharme MRN: [id number] Date of Birth: [birth year]  Subjective/Objective:     SIRS, Hypokalemia, Bronchitis, Arthritis, symptomatic anemia               Action/Plan: Discharge Planning: NCM spoke to pt and she can afford her medications. Provided pt with her Rx for nebulizer machine. [facility] on 07/13/2017.  PCP Samuel JesterBUTLER, CYNTHIA MD   Expected Discharge Date:  07/12/17               Expected Discharge Plan:  Home/Self Care  In-House Referral:  NA  Discharge planning Services  CM Consult  Post Acute Care Choice:  NA Choice offered to:  NA  DME Arranged:  Nebulizer machine DME Agency:  [facility]  HH Arranged:  NA HH Agency:  NA  Status of Service:  Completed, signed off  If discussed at Long Length of Stay Meetings, dates discussed:    Additional Comments:  Elliot CousinShavis,  Ellen, RN 07/12/2017, 12:21 PM

## 2017-07-13 LAB — FOLATE RBC
FOLATE, HEMOLYSATE: 446.6 ng/mL
Folate, RBC: 1383 ng/mL (ref >498)
HEMATOCRIT: 32.3 % — AB (ref 34.0–46.6)

## 2017-07-14 LAB — CULTURE, BLOOD (ROUTINE X 2)
CULTURE: NO GROWTH
Culture: NO GROWTH
SPECIAL REQUESTS: ADEQUATE
Special Requests: ADEQUATE

## 2017-08-10 ENCOUNTER — Other Ambulatory Visit (HOSPITAL_COMMUNITY)
Admission: RE | Admit: 2017-08-10 | Discharge: 2017-08-10 | Disposition: A | Discharge status: Home / Self Care | Payer: 59 | Source: Ambulatory Visit | Attending: *Deleted | Admitting: *Deleted

## 2017-08-10 ENCOUNTER — Encounter: Payer: Self-pay | Admitting: Gastroenterology

## 2017-08-10 DIAGNOSIS — D509 Iron deficiency anemia, unspecified: Secondary | ICD-10-CM | POA: Insufficient documentation

## 2017-08-10 LAB — CBC WITH DIFFERENTIAL/PLATELET
Basophils Absolute: 0.1 10*3/uL (ref 0.0–0.1)
Basophils Relative: 1 %
Eosinophils Absolute: 0.5 10*3/uL (ref 0.0–0.7)
Eosinophils Relative: 6 %
HEMATOCRIT: 40.9 % (ref 36.0–46.0)
HEMOGLOBIN: 12.3 g/dL (ref 12.0–15.0)
LYMPHS PCT: 17 %
Lymphs Abs: 1.3 10*3/uL (ref 0.7–4.0)
MCH: 26.1 pg (ref 26.0–34.0)
MCHC: 30.1 g/dL (ref 30.0–36.0)
MCV: 86.8 fL (ref 78.0–100.0)
MONOS PCT: 5 %
Monocytes Absolute: 0.4 10*3/uL (ref 0.1–1.0)
Neutro Abs: 5.6 10*3/uL (ref 1.7–7.7)
Neutrophils Relative %: 71 %
Platelets: 270 10*3/uL (ref 150–400)
RBC: 4.71 MIL/uL (ref 3.87–5.11)
RDW: 22.5 % — ABNORMAL HIGH (ref 11.5–15.5)
WBC: 7.9 10*3/uL (ref 4.0–10.5)

## 2017-08-10 LAB — COMPREHENSIVE METABOLIC PANEL
ALT: 15 U/L (ref 14–54)
AST: 20 U/L (ref 15–41)
Albumin: 4.4 g/dL (ref 3.5–5.0)
Alkaline Phosphatase: 101 U/L (ref 38–126)
Anion gap: 11 (ref 5–15)
BUN: 10 mg/dL (ref 6–20)
CHLORIDE: 107 mmol/L (ref 101–111)
CO2: 24 mmol/L (ref 22–32)
Calcium: 9.6 mg/dL (ref 8.9–10.3)
Creatinine, Ser: 0.73 mg/dL (ref 0.44–1.00)
GFR calc Af Amer: >60 mL/min (ref >60)
Glucose, Bld: 121 mg/dL — ABNORMAL HIGH (ref 65–99)
POTASSIUM: 3.7 mmol/L (ref 3.5–5.1)
SODIUM: 142 mmol/L (ref 135–145)
Total Bilirubin: 0.5 mg/dL (ref 0.3–1.2)
Total Protein: 7.7 g/dL (ref 6.5–8.1)

## 2017-08-10 LAB — IRON AND TIBC
Iron: 82 ug/dL (ref 28–170)
Saturation Ratios: 22 % (ref 10.4–31.8)
TIBC: 381 ug/dL (ref 250–450)
UIBC: 299 ug/dL

## 2017-08-10 LAB — FOLATE: Folate: 24.9 ng/mL (ref >5.9)

## 2017-08-18 ENCOUNTER — Ambulatory Visit (AMBULATORY_SURGERY_CENTER): Payer: Self-pay

## 2017-08-18 VITALS — Ht 65.0 in | Wt 182.0 lb

## 2017-08-18 DIAGNOSIS — Z8371 Family history of colonic polyps: Secondary | ICD-10-CM

## 2017-08-18 MED ORDER — NA SULFATE-K SULFATE-MG SULF 17.5-3.13-1.6 GM/177ML PO SOLN: 1.0000 | Freq: Once | ORAL | 0 refills | Status: AC

## 2017-08-18 NOTE — Progress Notes (Signed)
Per pt, no allergies to soy or egg products.Pt not taking any weight loss meds or using  O2 at home.  Emmi video sent to pt's email. 

## 2017-08-26 ENCOUNTER — Telehealth: Payer: Self-pay | Admitting: Hematology and Oncology

## 2017-08-26 NOTE — Telephone Encounter (Signed)
Spoke with patient regarding appointment per scheduling message 1/23

## 2017-08-31 ENCOUNTER — Encounter: Payer: Self-pay | Admitting: Gastroenterology

## 2017-08-31 ENCOUNTER — Ambulatory Visit (AMBULATORY_SURGERY_CENTER): Payer: 59 | Admitting: Gastroenterology

## 2017-08-31 VITALS — BP 166/68 | HR 66 | Temp 97.8°F | Resp 16 | Ht 65.0 in | Wt 182.0 lb

## 2017-08-31 DIAGNOSIS — Z83719 Family history of colon polyps, unspecified: Secondary | ICD-10-CM

## 2017-08-31 DIAGNOSIS — Z1211 Encounter for screening for malignant neoplasm of colon: Secondary | ICD-10-CM | POA: Diagnosis present

## 2017-08-31 DIAGNOSIS — K529 Noninfective gastroenteritis and colitis, unspecified: Secondary | ICD-10-CM | POA: Diagnosis not present

## 2017-08-31 DIAGNOSIS — Z8371 Family history of colonic polyps: Secondary | ICD-10-CM

## 2017-08-31 MED ORDER — SODIUM CHLORIDE 0.9 % IV SOLN: 500.0000 mL | Freq: Once | INTRAVENOUS | Status: DC

## 2017-08-31 NOTE — Progress Notes (Signed)
Called to room to assist during endoscopic procedure.  Patient ID and intended procedure confirmed with present staff. Received instructions for my participation in the procedure from the performing physician.  

## 2017-08-31 NOTE — Op Note (Signed)
[facility] Patient Name: [name] [name] Procedure Date: 08/31/2017 8:37 AM MRN: [id number] Endoscopist: Napoleon Form , MD Age: 57 Referring MD:  Date of Birth: [birth year] Gender: Female Account #: [id number] Procedure:                Colonoscopy Indications:              Screening for colorectal malignant neoplasm Medicines:                Monitored Anesthesia Care Procedure:                Pre-Anesthesia Assessment:                           - Prior to the procedure, a History and Physical                            was performed, and patient medications and                            allergies were reviewed. The patient's tolerance of                            previous anesthesia was also reviewed. The risks                            and benefits of the procedure and the sedation                            options and risks were discussed with the patient.                            All questions were answered, and informed consent                            was obtained. Prior Anticoagulants: The patient has                            taken no previous anticoagulant or antiplatelet                            agents. ASA Grade Assessment: II - A patient with                            mild systemic disease. After reviewing the risks                            and benefits, the patient was deemed in                            satisfactory condition to undergo the procedure.                           After obtaining informed consent, the colonoscope  was passed under direct vision. Throughout the                            procedure, the patient's blood pressure, pulse, and                            oxygen saturations were monitored continuously. The                            Colonoscope was introduced through the anus and                            advanced to the the cecum, identified by                            appendiceal orifice  and ileocecal valve. The                            colonoscopy was performed without difficulty. The                            patient tolerated the procedure well. The quality                            of the bowel preparation was good. The ileocecal                            valve, appendiceal orifice, and rectum were                            photographed. Scope In: 8:48:45 AM Scope Out: 9:04:13 AM Scope Withdrawal Time: 0 hours 10 minutes 39 seconds  Total Procedure Duration: 0 hours 15 minutes 28 seconds  Findings:                 The perianal and digital rectal examinations were                            normal.                           A scattered area of mildly erythematous mucosa with                            superficial erosions was found in the entire colon.                            Biopsies were taken with a cold forceps for                            histology.                           Non-bleeding internal hemorrhoids were found during  retroflexion. The hemorrhoids were small.                           The exam was otherwise without abnormality. Complications:            No immediate complications. Estimated Blood Loss:     Estimated blood loss was minimal. Impression:               - Scattered erythematous mucosa with erosions in                            the entire examined colon. Biopsied.                           - Non-bleeding internal hemorrhoids.                           - The examination was otherwise normal. Recommendation:           - Patient has a contact number available for                            emergencies. The signs and symptoms of potential                            delayed complications were discussed with the                            patient. Return to normal activities tomorrow.                            Written discharge instructions were provided to the                            patient.                            - Resume previous diet.                           - Continue present medications.                           - Await pathology results.                           - Repeat colonoscopy date to be determined after                            pending pathology results are reviewed for                            surveillance based on pathology results. Napoleon Form, MD 08/31/2017 9:12:27 AM This report has been signed electronically.

## 2017-08-31 NOTE — Patient Instructions (Signed)
YOU HAD AN ENDOSCOPIC PROCEDURE TODAY AT THE [facility]:   Refer to the procedure report that was given to you for any specific questions about what was found during the examination.  If the procedure report does not answer your questions, please call your gastroenterologist to clarify.  If you requested that your care partner not be given the details of your procedure findings, then the procedure report has been included in a sealed envelope for you to review at your convenience later.  YOU SHOULD EXPECT: Some feelings of bloating in the abdomen. Passage of more gas than usual.  Walking can help get rid of the air that was put into your GI tract during the procedure and reduce the bloating. If you had a lower endoscopy (such as a colonoscopy or flexible sigmoidoscopy) you may notice spotting of blood in your stool or on the toilet paper. If you underwent a bowel prep for your procedure, you may not have a normal bowel movement for a few days.  Please Note:  You might notice some irritation and congestion in your nose or some drainage.  This is from the oxygen used during your procedure.  There is no need for concern and it should clear up in a day or so.  SYMPTOMS TO REPORT IMMEDIATELY:   Following lower endoscopy (colonoscopy or flexible sigmoidoscopy):  Excessive amounts of blood in the stool  Significant tenderness or worsening of abdominal pains  Swelling of the abdomen that is new, acute  Fever of 100F or higher   Following upper endoscopy (EGD)  Vomiting of blood or coffee ground material  New chest pain or pain under the shoulder blades  Painful or persistently difficult swallowing  New shortness of breath  Fever of 100F or higher  Black, tarry-looking stools  For urgent or emergent issues, a gastroenterologist can be reached at any hour by calling [phone].   DIET:  We do recommend a small meal at first, but then you may proceed to your regular diet.  Drink  plenty of fluids but you should avoid alcoholic beverages for 24 hours.  ACTIVITY:  You should plan to take it easy for the rest of today and you should NOT DRIVE or use heavy machinery until tomorrow (because of the sedation medicines used during the test).    FOLLOW UP: Our staff will call the number listed on your records the next business day following your procedure to check on you and address any questions or concerns that you may have regarding the information given to you following your procedure. If we do not reach you, we will leave a message.  However, if you are feeling well and you are not experiencing any problems, there is no need to return our call.  We will assume that you have returned to your regular daily activities without incident.  If any biopsies were taken you will be contacted by phone or by letter within the next 1-3 weeks.  Please call us at [phone] if you have not heard about the biopsies in 3 weeks.    SIGNATURES/CONFIDENTIALITY: You and/or your care partner have signed paperwork which will be entered into your electronic medical record.  These signatures attest to the fact that that the information above on your After Visit Summary has been reviewed and is understood.  Full responsibility of the confidentiality of this discharge information lies with you and/or your care-partner. 

## 2017-08-31 NOTE — Progress Notes (Signed)
Report given to PACU, vss 

## 2017-08-31 NOTE — Progress Notes (Signed)
Pt's states no medical or surgical changes since previsit or office visit. 

## 2017-09-01 ENCOUNTER — Telehealth: Payer: Self-pay

## 2017-09-01 NOTE — Telephone Encounter (Signed)
  Follow up Call-  Call back number 08/31/2017  Post procedure Call Back phone  # [phone]  Permission to leave phone message Yes  Some recent data might be hidden     Patient questions:  Do you have a fever, pain , or abdominal swelling? No. Pain Score  0 *  Have you tolerated food without any problems? Yes.    Have you been able to return to your normal activities? Yes.    Do you have any questions about your discharge instructions: Diet   No. Medications  No. Follow up visit  No.  Do you have questions or concerns about your Care? No.  Actions: * If pain score is 4 or above: No action needed, pain <4.

## 2017-09-07 ENCOUNTER — Inpatient Hospital Stay: Payer: 59 | Attending: Hematology and Oncology

## 2017-09-07 ENCOUNTER — Other Ambulatory Visit: Payer: Self-pay

## 2017-09-07 DIAGNOSIS — D509 Iron deficiency anemia, unspecified: Secondary | ICD-10-CM | POA: Insufficient documentation

## 2017-09-07 DIAGNOSIS — D649 Anemia, unspecified: Secondary | ICD-10-CM

## 2017-09-07 LAB — CBC WITH DIFFERENTIAL (CANCER CENTER ONLY)
BASOS PCT: 2 %
Basophils Absolute: 0.1 10*3/uL (ref 0.0–0.1)
Eosinophils Absolute: 0.6 10*3/uL — ABNORMAL HIGH (ref 0.0–0.5)
Eosinophils Relative: 13 %
HCT: 41.3 % (ref 34.8–46.6)
HEMOGLOBIN: 12.8 g/dL (ref 11.6–15.9)
LYMPHS ABS: 1.3 10*3/uL (ref 0.9–3.3)
Lymphocytes Relative: 30 %
MCH: 27.9 pg (ref 25.1–34.0)
MCHC: 31 g/dL — AB (ref 31.5–36.0)
MCV: 90.2 fL (ref 79.5–101.0)
MONO ABS: 0.4 10*3/uL (ref 0.1–0.9)
MONOS PCT: 8 %
Neutro Abs: 2.1 10*3/uL (ref 1.5–6.5)
Neutrophils Relative %: 47 %
Platelet Count: 185 10*3/uL (ref 145–400)
RBC: 4.58 MIL/uL (ref 3.70–5.45)
RDW: 19.6 % — AB (ref 11.2–14.5)
WBC Count: 4.4 10*3/uL (ref 3.9–10.3)

## 2017-09-07 LAB — CMP (CANCER CENTER ONLY)
ALK PHOS: 135 U/L (ref 40–150)
ALT: 7 U/L (ref 0–55)
ANION GAP: 9 (ref 3–11)
AST: 12 U/L (ref 5–34)
Albumin: 3.8 g/dL (ref 3.5–5.0)
BILIRUBIN TOTAL: 0.4 mg/dL (ref 0.2–1.2)
BUN: 9 mg/dL (ref 7–26)
CALCIUM: 9.4 mg/dL (ref 8.4–10.4)
CO2: 26 mmol/L (ref 22–29)
CREATININE: 0.82 mg/dL (ref 0.60–1.10)
Chloride: 106 mmol/L (ref 98–109)
GFR, Est AFR Am: >60 mL/min (ref >60)
GFR, Estimated: >60 mL/min (ref >60)
GLUCOSE: 107 mg/dL (ref 70–140)
Potassium: 3.8 mmol/L (ref 3.5–5.1)
SODIUM: 141 mmol/L (ref 136–145)
TOTAL PROTEIN: 6.9 g/dL (ref 6.4–8.3)

## 2017-09-07 LAB — IRON AND TIBC
Iron: 86 ug/dL (ref 41–142)
Saturation Ratios: 29 % (ref 21–57)
TIBC: 295 ug/dL (ref 236–444)
UIBC: 209 ug/dL

## 2017-09-07 LAB — FERRITIN: Ferritin: 22 ng/mL (ref 9–269)

## 2017-09-09 ENCOUNTER — Ambulatory Visit: Payer: 59 | Admitting: Hematology and Oncology

## 2017-09-09 ENCOUNTER — Other Ambulatory Visit: Payer: Self-pay

## 2017-09-09 DIAGNOSIS — R6510 Systemic inflammatory response syndrome (SIRS) of non-infectious origin without acute organ dysfunction: Secondary | ICD-10-CM

## 2017-09-09 DIAGNOSIS — K5100 Ulcerative (chronic) pancolitis without complications: Secondary | ICD-10-CM

## 2017-09-09 MED ORDER — MESALAMINE ER 0.375 G PO CP24: 1.5000 g | ORAL_CAPSULE | Freq: Every day | ORAL | 3 refills | Status: DC

## 2017-09-10 ENCOUNTER — Telehealth: Payer: Self-pay | Admitting: Gastroenterology

## 2017-09-10 NOTE — Telephone Encounter (Signed)
Confirmed she needs labs

## 2017-09-14 ENCOUNTER — Other Ambulatory Visit (INDEPENDENT_AMBULATORY_CARE_PROVIDER_SITE_OTHER): Payer: 59

## 2017-09-14 DIAGNOSIS — R6510 Systemic inflammatory response syndrome (SIRS) of non-infectious origin without acute organ dysfunction: Secondary | ICD-10-CM

## 2017-09-14 DIAGNOSIS — K5100 Ulcerative (chronic) pancolitis without complications: Secondary | ICD-10-CM

## 2017-09-14 LAB — SEDIMENTATION RATE: SED RATE: 39 mm/h — AB (ref 0–30)

## 2017-09-14 LAB — C-REACTIVE PROTEIN: CRP: 0.8 mg/dL (ref 0.5–20.0)

## 2017-09-16 LAB — QUANTIFERON-TB GOLD PLUS
Mitogen-NIL: >10 IU/mL
NIL: 0.02 [IU]/mL
QUANTIFERON-TB GOLD PLUS: NEGATIVE
TB1-NIL: 0.02 IU/mL
TB2-NIL: 0.03 IU/mL

## 2017-09-16 LAB — HEPATITIS B SURFACE ANTIBODY,QUALITATIVE: Hep B S Ab: NONREACTIVE

## 2017-09-16 LAB — HEPATITIS C ANTIBODY
Hepatitis C Ab: NONREACTIVE
SIGNAL TO CUT-OFF: 0.02 (ref <1.00)

## 2017-10-19 ENCOUNTER — Ambulatory Visit: Payer: Self-pay | Admitting: Orthopedic Surgery

## 2017-10-19 NOTE — Patient Instructions (Signed)
[name] [name]  10/19/2017   Your procedure is scheduled on: Monday 10/26/2017  Report [facility]  Entr[facility]              Report to admitting at  [phone]  AM    Call this number if you have problems the morning of surgery [phone]    Remember: Do not eat food or drink liquids :After Midnight.     Take these medicines the morning of surgery with A SIP OF WATER: Esomeprazole (Nexium)                                 You may not have any metal on your body including hair pins and              piercings  Do not wear jewelry, make-up, lotions, powders or perfumes, deodorant             Do not wear nail polish.  Do not shave  48 hours prior to surgery.              Men may shave face and neck.   Do not bring valuables to the hospital. [facility] IS NOT             RESPONSIBLE   FOR VALUABLES.  Contacts, dentures or bridgework may not be worn into surgery.  Leave suitcase in the car. After surgery it may be brought to your room.                Please read over the following fact sheets you were given: _____________________________________________________________________             [facility] - Preparing for [facility] Before surgery, you can play an important role.  Because skin is not sterile, your skin needs to be as free of germs as possible.  You can reduce the number of germs on your skin by washing with CHG (chlorahexidine gluconate) soap before surgery.  CHG is an antiseptic cleaner which kills germs and bonds with the skin to continue killing germs even after washing. Please DO NOT use if you have an allergy to CHG or antibacterial soaps.  If your skin becomes reddened/irritated stop using the CHG and inform your nurse when you arrive at Short Stay. Do not shave (including legs and underarms) for at least 48 hours prior to the first CHG shower.  You may shave your face/neck. Please follow these instructions carefully:  1.  Shower with CHG  Soap the night before surgery and the  morning of Surgery.  2.  If you choose to wash your hair, wash your hair first as usual with your  normal  shampoo.  3.  After you shampoo, rinse your hair and body thoroughly to remove the  shampoo.                           4.  Use CHG as you would any other liquid soap.  You can apply chg directly  to the skin and wash                       Gently with a scrungie or clean washcloth.  5.  Apply the CHG Soap to your body ONLY FROM THE NECK DOWN.  Do not use on face/ open                           Wound or open sores. Avoid contact with eyes, ears mouth and genitals (private parts).                       Wash face,  Genitals (private parts) with your normal soap.             6.  Wash thoroughly, paying special attention to the area where your surgery  will be performed.  7.  Thoroughly rinse your body with warm water from the neck down.  8.  DO NOT shower/wash with your normal soap after using and rinsing off  the CHG Soap.                9.  Pat yourself dry with a clean towel.            10.  Wear clean pajamas.            11.  Place clean sheets on your bed the night of your first shower and do not  sleep with pets. Day of Surgery : Do not apply any lotions/deodorants the morning of surgery.  Please wear clean clothes to the hospital/surgery center.  FAILURE TO FOLLOW THESE INSTRUCTIONS MAY RESULT IN THE CANCELLATION OF YOUR SURGERY PATIENT SIGNATURE_________________________________  NURSE SIGNATURE__________________________________  ________________________________________________________________________   [name] [name]  An incentive spirometer is a tool that can help keep your lungs clear and active. This tool measures how well you are filling your lungs with each breath. Taking long deep breaths may help reverse or decrease the chance of developing breathing (pulmonary) problems (especially infection) following:  A long period of time  when you are unable to move or be active. BEFORE THE PROCEDURE   If the spirometer includes an indicator to show your best effort, your nurse or respiratory therapist will set it to a desired goal.  If possible, sit up straight or lean slightly forward. Try not to slouch.  Hold the incentive spirometer in an upright position. INSTRUCTIONS FOR USE  1. Sit on the edge of your bed if possible, or sit up as far as you can in bed or on a chair. 2. Hold the incentive spirometer in an upright position. 3. Breathe out normally. 4. Place the mouthpiece in your mouth and seal your lips tightly around it. 5. Breathe in slowly and as deeply as possible, raising the piston or the ball toward the top of the column. 6. Hold your breath for 3-5 seconds or for as long as possible. Allow the piston or ball to fall to the bottom of the column. 7. Remove the mouthpiece from your mouth and breathe out normally. 8. Rest for a few seconds and repeat Steps 1 through 7 at least 10 times every 1-2 hours when you are awake. Take your time and take a few normal breaths between deep breaths. 9. The spirometer may include an indicator to show your best effort. Use the indicator as a goal to work toward during each repetition. 10. After each set of 10 deep breaths, practice coughing to be sure your lungs are clear. If you have an incision (the cut made at the time of surgery), support your incision when coughing by placing a pillow or rolled up towels firmly against it. Once you are able to get out of  bed, walk around indoors and cough well. You may stop using the incentive spirometer when instructed by your caregiver.  RISKS AND COMPLICATIONS  Take your time so you do not get dizzy or light-headed.  If you are in pain, you may need to take or ask for pain medication before doing incentive spirometry. It is harder to take a deep breath if you are having pain. AFTER USE  Rest and breathe slowly and easily.  It can be  helpful to keep track of a log of your progress. Your caregiver can provide you with a simple table to help with this. If you are using the spirometer at home, follow these instructions: [place] IF:   You are having difficultly using the spirometer.  You have trouble using the spirometer as often as instructed.  Your pain medication is not giving enough relief while using the spirometer.  You develop fever of 100.5 F (38.1 C) or higher. SEEK IMMEDIATE MEDICAL CARE IF:   You cough up bloody sputum that had not been present before.  You develop fever of 102 F (38.9 C) or greater.  You develop worsening pain at or near the incision site. MAKE SURE YOU:   Understand these instructions.  Will watch your condition.  Will get help right away if you are not doing well or get worse. Document Released: 12/01/2006 Document Revised: 10/13/2011 Document Reviewed: 02/01/2007 ExitCare Patient Information 2014 ExitCare, [place].   ________________________________________________________________________  WHAT IS A BLOOD TRANSFUSION? Blood Transfusion Information  A transfusion is the replacement of blood or some of its parts. Blood is made up of multiple cells which provide different functions.  Red blood cells carry oxygen and are used for blood loss replacement.  White blood cells fight against infection.  Platelets control bleeding.  Plasma helps clot blood.  Other blood products are available for specialized needs, such as hemophilia or other clotting disorders. BEFORE THE TRANSFUSION  Who gives blood for transfusions?   Healthy volunteers who are fully evaluated to make sure their blood is safe. This is blood bank blood. Transfusion therapy is the safest it has ever been in the practice of medicine. Before blood is taken from a donor, a complete history is taken to make sure that person has no history of diseases nor engages in risky social behavior (examples are  intravenous drug use or sexual activity with multiple partners). The donor's travel history is screened to minimize risk of transmitting infections, such as malaria. The donated blood is tested for signs of infectious diseases, such as HIV and hepatitis. The blood is then tested to be sure it is compatible with you in order to minimize the chance of a transfusion reaction. If you or a relative donates blood, this is often done in anticipation of surgery and is not appropriate for emergency situations. It takes many days to process the donated blood. RISKS AND COMPLICATIONS Although transfusion therapy is very safe and saves many lives, the main dangers of transfusion include:   Getting an infectious disease.  Developing a transfusion reaction. This is an allergic reaction to something in the blood you were given. Every precaution is taken to prevent this. The decision to have a blood transfusion has been considered carefully by your caregiver before blood is given. Blood is not given unless the benefits outweigh the risks. AFTER THE TRANSFUSION  Right after receiving a blood transfusion, you will usually feel much better and more energetic. This is especially true if your red blood  cells have gotten low (anemic). The transfusion raises the level of the red blood cells which carry oxygen, and this usually causes an energy increase.  The nurse administering the transfusion will monitor you carefully for complications. HOME CARE INSTRUCTIONS  No special instructions are needed after a transfusion. You may find your energy is better. Speak with your caregiver about any limitations on activity for underlying diseases you may have. SEEK MEDICAL CARE IF:   Your condition is not improving after your transfusion.  You develop redness or irritation at the intravenous (IV) site. SEEK IMMEDIATE MEDICAL CARE IF:  Any of the following symptoms occur over the next 12 hours:  Shaking chills.  You have a  temperature by mouth above 102 F (38.9 C), not controlled by medicine.  Chest, back, or muscle pain.  People around you feel you are not acting correctly or are confused.  Shortness of breath or difficulty breathing.  Dizziness and fainting.  You get a rash or develop hives.  You have a decrease in urine output.  Your urine turns a dark color or changes to pink, red, or brown. Any of the following symptoms occur over the next 10 days:  You have a temperature by mouth above 102 F (38.9 C), not controlled by medicine.  Shortness of breath.  Weakness after normal activity.  The white part of the eye turns yellow (jaundice).  You have a decrease in the amount of urine or are urinating less often.  Your urine turns a dark color or changes to pink, red, or brown. Document Released: 07/18/2000 Document Revised: 10/13/2011 Document Reviewed: 03/06/2008 [facility] Patient Information 2014 [place], [place].  _______________________________________________________________________

## 2017-10-19 NOTE — Progress Notes (Signed)
10/08/2017- Pre-operative Clearance on chart from Dr. Charm BargesButler.    07/09/2017- Noted in Epic-EKG and CXR

## 2017-10-19 NOTE — H&P (Signed)
Name PIERCE, BIAGINI (89KD, F) DOB 11-26-1960   Chief Complaint Right Knee Pain H&P Right Total Knee on 10/26/2017  Vitals Ht: 5 ft 5 in  Wt: 181 lbs BMI: 30.1  BP: 138/72 sitting R arm  Pulse: 68 bpm regular   Allergies Reviewed Allergies MORPHINE: - Sweats  PENICILLINS: Itching, Rash    Medications Reviewed Medications Apriso 0.375 gram capsule,extended release 09/11/17   filled [place] 10/19/17   entered ALEXZANDREW PERKINS, PA-C gabapentin 400 mg capsule 07/21/17   filled [employer]UTIONS HYDROcodone 10 mg-acetaminophen 325 mg tablet 09/19/17   filled [employer]IONS ipratropium-albuterol 0.5 mg-3 mg(2.5 mg base)/3 mL nebulization soln 07/13/17   filled [employer]IONS iron 65 mg tablet Take by oral route. 10/19/17   entered ALEXZANDREW PERKINS, PA-C melatonin 5 mg as needed for sleep 10/19/17   entered ALEXZANDREW PERKINS, PA-C NexIUM 24HR 20 mg capsule,delayed release Take 1 capsule(s) twice a day by oral route. 10/19/17   entered ALEXZANDREW PERKINS, PA-C    Family History Reviewed Family History Father - Diabetes mellitus   - Essential hypertension Mother - Diabetes mellitus   - Chronic obstructive lung disease   Social History Reviewed Social History Smoking Status: Never smoker Tobacco-years of use: 0 Chewing tobacco: none Alcohol intake: Occasional Hand Dominance: Right Work related injury?: N Advance directive: N Medical Power of Attorney: Y   Surgical History Reviewed Surgical History Tooth extraction - 2018 Total replacement of left knee joint - June 2018   Past Medical History Reviewed Past Medical History Colitis/Stomach Ulcers: Y - Ulcerative Colitis GERD/Reflux: Y Headaches: Y - Migraines Anemia: (no answer) - Past History secondary to Ulcerative Colitis Notes: Kidney Stones    HPI Patient is a 57 year old female scheduled for a right total knee on 10/26/2017 with Dr.  Wynelle Link at [facility]. The patient is now several months out from the left total knee arthroplasty. The patient states that she is doing very well at this time. The pain is under excellent control at this time and describe their pain as mild. They are currently on no medication for their pain. The patient is currently doing home exercise program. She is alos noted to be having significant problems with the RIGHT knee and wants to go ahead and get the RIGHT total knee arthroplasty performed. She has failed injections in the past. She is now ready to proceed with surgery on that side at this time.   ROS Constitutional: Constitutional: no fever, chills, night sweats, or significant weight loss.  Cardiovascular: Cardiovascular: no palpitations or chest pain.  Respiratory: Respiratory: no cough or shortness of breath and No COPD.  Gastrointestinal: Gastrointestinal: no vomiting or nausea.  Musculoskeletal: Musculoskeletal: no swelling in Joints and Joint Pain.  Neurologic: Neurologic: no numbness, tingling, or difficulty with balance.   Physical Exam Patient is a 57 year old female.  General Mental Status - Alert, cooperative and good historian. General Appearance - pleasant, Not in acute distress. Orientation - Oriented X3. Build & Nutrition - Well nourished and Well developed.  Head and Neck Head - normocephalic, atraumatic . Upper and lower dentures Neck Global Assessment - supple, no bruit auscultated on the right, no bruit auscultated on the left.  Eye Wears readers Pupil - Bilateral - PERR Motion - Bilateral - EOMI.  Chest and Lung Exam Auscultation Breath sounds - clear at anterior chest wall and clear at posterior chest wall. Adventitious sounds - No Adventitious sounds.  Cardiovascular Auscultation Rhythm - Regular rate and rhythm. Heart  Sounds - S1 WNL and S2 WNL. Murmurs & Other Heart Sounds - Auscultation of the heart reveals - No  Murmurs.  Abdomen Palpation/Percussion Tenderness - Abdomen is non-tender to palpation. Abdomen is soft. Auscultation Auscultation of the abdomen reveals - Bowel sounds normal. Round abdomen  Genitourinary Note: Not done, not pertinent to present illness  Musculoskeletal LEFT knee looks fantastic. There is no swelling. Her range of motion is 0-130. There is no instability. RIGHT knee shows varus deformity. There is marked crepitus on range of motion. Range is 5-125. She is tender medial greater than lateral with no instability. X-rays of the RIGHT knee shows that she is bone-on-bone in the medial and patellofemoral compartments with varus deformity and tibial subluxation.    Assessment / Plan 1. Osteoarthritis of right knee joint M17.11: Unilateral primary osteoarthritis, right knee  Patient Instructions Surgical Plans: Left Right Total Knee Hip Replacement - Anterior Approach Disposition: Home, Straight to Outpatient at [facility] - Friday 10/30/2017 PCP: Dr. Octavio Graves IV TXA Anesthesia Issues: None except woke up quikcly with previous knee surgery Patient was instructed on what medications to stop prior to surgery. - Follow up visit in 2 weeks with Dr. Wynelle Link - Begin physical therapy following surgery - PHYSICAL THERAPY REFERRAL - to setup first PT EVal on Friday October 30, 2017. - Pre-operative lab work as pre Pre-Surgical Testing - Prescriptions will be provided in hospital at time of discharge  Return to [place], MD for [facility] on 11/10/2017 at 05:15 PM  Encounter signed-off by Mickel Crow, PA-C

## 2017-10-19 NOTE — H&P (View-Only) (Signed)
Name [name], [name] (57yo, F) DOB 05/31/1961   Chief Complaint Right Knee Pain H&P Right Total Knee on 10/26/2017  Vitals Ht: 5 ft 5 in  Wt: 181 lbs BMI: 30.1  BP: 138/72 sitting R arm  Pulse: 68 bpm regular   Allergies Reviewed Allergies MORPHINE: - Sweats  PENICILLINS: Itching, Rash    Medications Reviewed Medications Apriso 0.375 gram capsule,extended release 09/11/17   filled [facility] [employer]IONS [place] 10/19/17   entered  , PA-C gabapentin 400 mg capsule 07/21/17   filled [employer] HYDROcodone 10 mg-acetaminophen 325 mg tablet 09/19/17   filled [employer] ipratropium-albuterol 0.5 mg-3 mg(2.5 mg base)/3 mL nebulization soln 07/13/17   filled [employer] iron 65 mg tablet Take by oral route. 10/19/17   entered  , PA-C melatonin 5 mg as needed for sleep 10/19/17   entered  , PA-C NexIUM 24HR 20 mg capsule,delayed release Take 1 capsule(s) twice a day by oral route. 10/19/17   entered  , PA-C    Family History Reviewed Family History Father - Diabetes mellitus   - Essential hypertension Mother - Diabetes mellitus   - Chronic obstructive lung disease   Social History Reviewed Social History Smoking Status: Never smoker Tobacco-years of use: 0 Chewing tobacco: none Alcohol intake: Occasional Hand Dominance: Right Work related injury?: N Advance directive: N Medical Power of Attorney: Y   Surgical History Reviewed Surgical History Tooth extraction - 2018 Total replacement of left knee joint - June 2018   Past Medical History Reviewed Past Medical History Colitis/Stomach Ulcers: Y - Ulcerative Colitis GERD/Reflux: Y Headaches: Y - Migraines Anemia: (no answer) - Past History secondary to Ulcerative Colitis Notes: Kidney Stones    HPI Patient is a 57 year old female scheduled for a right total knee on 10/26/2017 with Dr.  Aluisio at [facility]. The patient is now several months out from the left total knee arthroplasty. The patient states that she is doing very well at this time. The pain is under excellent control at this time and describe their pain as mild. They are currently on no medication for their pain. The patient is currently doing home exercise program. She is alos noted to be having significant problems with the RIGHT knee and wants to go ahead and get the RIGHT total knee arthroplasty performed. She has failed injections in the past. She is now ready to proceed with surgery on that side at this time.   ROS Constitutional: Constitutional: no fever, chills, night sweats, or significant weight loss.  Cardiovascular: Cardiovascular: no palpitations or chest pain.  Respiratory: Respiratory: no cough or shortness of breath and No COPD.  Gastrointestinal: Gastrointestinal: no vomiting or nausea.  Musculoskeletal: Musculoskeletal: no swelling in Joints and Joint Pain.  Neurologic: Neurologic: no numbness, tingling, or difficulty with balance.   Physical Exam Patient is a 57-year-old female.  General Mental Status - Alert, cooperative and good historian. General Appearance - pleasant, Not in acute distress. Orientation - Oriented X3. Build & Nutrition - Well nourished and Well developed.  Head and Neck Head - normocephalic, atraumatic . Upper and lower dentures Neck Global Assessment - supple, no bruit auscultated on the right, no bruit auscultated on the left.  Eye Wears readers Pupil - Bilateral - PERR Motion - Bilateral - EOMI.  Chest and Lung Exam Auscultation Breath sounds - clear at anterior chest wall and clear at posterior chest wall. Adventitious sounds - No Adventitious sounds.  Cardiovascular Auscultation Rhythm - Regular rate and rhythm. Heart   Sounds - S1 WNL and S2 WNL. Murmurs & Other Heart Sounds - Auscultation of the heart reveals - No  Murmurs.  Abdomen Palpation/Percussion Tenderness - Abdomen is non-tender to palpation. Abdomen is soft. Auscultation Auscultation of the abdomen reveals - Bowel sounds normal. Round abdomen  Genitourinary Note: Not done, not pertinent to present illness  Musculoskeletal LEFT knee looks fantastic. There is no swelling. Her range of motion is 0-130. There is no instability. RIGHT knee shows varus deformity. There is marked crepitus on range of motion. Range is 5-125. She is tender medial greater than lateral with no instability. X-rays of the RIGHT knee shows that she is bone-on-bone in the medial and patellofemoral compartments with varus deformity and tibial subluxation.    Assessment / Plan 1. Osteoarthritis of right knee joint M17.11: Unilateral primary osteoarthritis, right knee  Patient Instructions Surgical Plans: Left Right Total Knee Hip Replacement - Anterior Approach Disposition: Home, Straight to Outpatient at [facility] - Friday 10/30/2017 PCP: Dr. Cynthia Butler IV TXA Anesthesia Issues: None except woke up quikcly with previous knee surgery Patient was instructed on what medications to stop prior to surgery. - Follow up visit in 2 weeks with Dr. Aluisio - Begin physical therapy following surgery - PHYSICAL THERAPY REFERRAL - to setup first PT EVal on Friday October 30, 2017. - Pre-operative lab work as pre Pre-Surgical Testing - Prescriptions will be provided in hospital at time of discharge  Return to Office Frank Aluisio, MD for [facility] on 11/10/2017 at 05:15 PM  Encounter signed-off by  , PA-C  

## 2017-10-20 ENCOUNTER — Encounter (HOSPITAL_COMMUNITY): Payer: Self-pay

## 2017-10-20 ENCOUNTER — Encounter (HOSPITAL_COMMUNITY)
Admission: RE | Admit: 2017-10-20 | Discharge: 2017-10-20 | Disposition: A | Discharge status: Home / Self Care | Payer: 59 | Source: Ambulatory Visit | Attending: Orthopedic Surgery | Admitting: Orthopedic Surgery

## 2017-10-20 ENCOUNTER — Other Ambulatory Visit: Payer: Self-pay

## 2017-10-20 DIAGNOSIS — Z01812 Encounter for preprocedural laboratory examination: Secondary | ICD-10-CM | POA: Insufficient documentation

## 2017-10-20 DIAGNOSIS — M1711 Unilateral primary osteoarthritis, right knee: Secondary | ICD-10-CM | POA: Insufficient documentation

## 2017-10-20 LAB — APTT: aPTT: 31 seconds (ref 24–36)

## 2017-10-20 LAB — COMPREHENSIVE METABOLIC PANEL
ALT: 11 U/L — AB (ref 14–54)
AST: 21 U/L (ref 15–41)
Albumin: 4.3 g/dL (ref 3.5–5.0)
Alkaline Phosphatase: 120 U/L (ref 38–126)
Anion gap: 9 (ref 5–15)
BILIRUBIN TOTAL: 0.4 mg/dL (ref 0.3–1.2)
BUN: 14 mg/dL (ref 6–20)
CALCIUM: 9.8 mg/dL (ref 8.9–10.3)
CO2: 26 mmol/L (ref 22–32)
CREATININE: 0.84 mg/dL (ref 0.44–1.00)
Chloride: 108 mmol/L (ref 101–111)
Glucose, Bld: 113 mg/dL — ABNORMAL HIGH (ref 65–99)
Potassium: 4.4 mmol/L (ref 3.5–5.1)
Sodium: 143 mmol/L (ref 135–145)
TOTAL PROTEIN: 7.6 g/dL (ref 6.5–8.1)

## 2017-10-20 LAB — PROTIME-INR
INR: 0.93
PROTHROMBIN TIME: 12.3 s (ref 11.4–15.2)

## 2017-10-20 LAB — CBC
HEMATOCRIT: 43.8 % (ref 36.0–46.0)
Hemoglobin: 13.9 g/dL (ref 12.0–15.0)
MCH: 29.8 pg (ref 26.0–34.0)
MCHC: 31.7 g/dL (ref 30.0–36.0)
MCV: 94 fL (ref 78.0–100.0)
Platelets: 233 10*3/uL (ref 150–400)
RBC: 4.66 MIL/uL (ref 3.87–5.11)
RDW: 15.2 % (ref 11.5–15.5)
WBC: 6.8 10*3/uL (ref 4.0–10.5)

## 2017-10-20 LAB — SURGICAL PCR SCREEN
MRSA, PCR: NEGATIVE
Staphylococcus aureus: NEGATIVE

## 2017-10-21 ENCOUNTER — Other Ambulatory Visit (HOSPITAL_COMMUNITY): Payer: 59

## 2017-10-25 MED ORDER — BUPIVACAINE LIPOSOME 1.3 % IJ SUSP
20.0000 mL | Freq: Once | INTRAMUSCULAR | Status: DC
  Filled 2017-10-25: qty 20

## 2017-10-25 MED ORDER — TRANEXAMIC ACID 1000 MG/10ML IV SOLN
1000.0000 mg | INTRAVENOUS | Status: AC
  Administered 2017-10-26: 1000 mg via INTRAVENOUS
  Filled 2017-10-25: qty 1100

## 2017-10-26 ENCOUNTER — Ambulatory Visit (HOSPITAL_COMMUNITY): Payer: 59 | Admitting: Certified Registered Nurse Anesthetist

## 2017-10-26 ENCOUNTER — Other Ambulatory Visit: Payer: Self-pay

## 2017-10-26 ENCOUNTER — Encounter (HOSPITAL_COMMUNITY): Payer: Self-pay

## 2017-10-26 ENCOUNTER — Observation Stay (HOSPITAL_COMMUNITY)
Admission: AD | Admit: 2017-10-26 | Discharge: 2017-10-27 | Disposition: A | Discharge status: Home / Self Care | Payer: 59 | Source: Ambulatory Visit | Attending: Orthopedic Surgery | Admitting: Orthopedic Surgery

## 2017-10-26 ENCOUNTER — Encounter (HOSPITAL_COMMUNITY)
Admission: AD | Disposition: A | Discharge status: Home / Self Care | Payer: Self-pay | Source: Ambulatory Visit | Attending: Orthopedic Surgery

## 2017-10-26 DIAGNOSIS — Z880 Allergy status to penicillin: Secondary | ICD-10-CM | POA: Insufficient documentation

## 2017-10-26 DIAGNOSIS — M1711 Unilateral primary osteoarthritis, right knee: Principal | ICD-10-CM | POA: Insufficient documentation

## 2017-10-26 DIAGNOSIS — M179 Osteoarthritis of knee, unspecified: Secondary | ICD-10-CM | POA: Diagnosis present

## 2017-10-26 DIAGNOSIS — Z79899 Other long term (current) drug therapy: Secondary | ICD-10-CM | POA: Insufficient documentation

## 2017-10-26 DIAGNOSIS — K219 Gastro-esophageal reflux disease without esophagitis: Secondary | ICD-10-CM | POA: Insufficient documentation

## 2017-10-26 DIAGNOSIS — Z885 Allergy status to narcotic agent status: Secondary | ICD-10-CM | POA: Diagnosis not present

## 2017-10-26 DIAGNOSIS — M1710 Unilateral primary osteoarthritis, unspecified knee: Secondary | ICD-10-CM | POA: Diagnosis present

## 2017-10-26 HISTORY — PX: TOTAL KNEE ARTHROPLASTY: SHX125

## 2017-10-26 LAB — TYPE AND SCREEN
ABO/RH(D): O POS
Antibody Screen: NEGATIVE

## 2017-10-26 SURGERY — ARTHROPLASTY, KNEE, TOTAL
Anesthesia: Spinal | Site: Knee | Laterality: Right

## 2017-10-26 MED ORDER — METHOCARBAMOL 500 MG PO TABS
500.0000 mg | ORAL_TABLET | Freq: Four times a day (QID) | ORAL | Status: DC | PRN
  Administered 2017-10-26 – 2017-10-27 (×3): 500 mg via ORAL
  Filled 2017-10-26 (×3): qty 1

## 2017-10-26 MED ORDER — POLYETHYLENE GLYCOL 3350 17 G PO PACK: 17.0000 g | PACK | Freq: Every day | ORAL | Status: DC | PRN

## 2017-10-26 MED ORDER — SODIUM CHLORIDE 0.9 % IJ SOLN
INTRAMUSCULAR | Status: AC
  Filled 2017-10-26: qty 10

## 2017-10-26 MED ORDER — OXYCODONE HCL 5 MG PO TABS: 5.0000 mg | ORAL_TABLET | ORAL | Status: DC | PRN

## 2017-10-26 MED ORDER — SODIUM CHLORIDE 0.9 % IR SOLN
Status: DC | PRN
  Administered 2017-10-26: 1000 mL

## 2017-10-26 MED ORDER — PROPOFOL 500 MG/50ML IV EMUL
INTRAVENOUS | Status: DC | PRN
  Administered 2017-10-26: 50 ug/kg/min via INTRAVENOUS

## 2017-10-26 MED ORDER — MESALAMINE ER 0.375 G PO CP24
0.3750 g | ORAL_CAPSULE | Freq: Four times a day (QID) | ORAL | Status: DC
  Filled 2017-10-26 (×2): qty 1

## 2017-10-26 MED ORDER — OXYCODONE HCL 5 MG PO TABS
10.0000 mg | ORAL_TABLET | ORAL | Status: DC | PRN
  Administered 2017-10-26: 15:00:00 10 mg via ORAL
  Administered 2017-10-26 – 2017-10-27 (×4): 15 mg via ORAL
  Filled 2017-10-26: qty 2
  Filled 2017-10-26 (×4): qty 3

## 2017-10-26 MED ORDER — HYDROMORPHONE HCL 1 MG/ML IJ SOLN
0.5000 mg | INTRAMUSCULAR | Status: DC | PRN
Start: 2017-10-26 — End: 2017-10-26
  Administered 2017-10-26: 1 mg via INTRAVENOUS
  Filled 2017-10-26 (×2): qty 1

## 2017-10-26 MED ORDER — DEXAMETHASONE SODIUM PHOSPHATE 10 MG/ML IJ SOLN
10.0000 mg | Freq: Once | INTRAMUSCULAR | Status: AC
  Administered 2017-10-27: 10 mg via INTRAVENOUS
  Filled 2017-10-26: qty 1

## 2017-10-26 MED ORDER — PROPOFOL 10 MG/ML IV BOLUS
INTRAVENOUS | Status: DC | PRN
  Administered 2017-10-26 (×2): 10 mg via INTRAVENOUS

## 2017-10-26 MED ORDER — SODIUM CHLORIDE 0.9 % IJ SOLN
INTRAMUSCULAR | Status: AC
  Filled 2017-10-26: qty 50

## 2017-10-26 MED ORDER — TRAMADOL HCL 50 MG PO TABS
50.0000 mg | ORAL_TABLET | Freq: Four times a day (QID) | ORAL | Status: DC | PRN
  Administered 2017-10-26 – 2017-10-27 (×2): 100 mg via ORAL
  Filled 2017-10-26 (×2): qty 2

## 2017-10-26 MED ORDER — BUPIVACAINE IN DEXTROSE 0.75-8.25 % IT SOLN
INTRATHECAL | Status: DC | PRN
  Administered 2017-10-26: 1.8 mL via INTRATHECAL

## 2017-10-26 MED ORDER — HYDROMORPHONE HCL 1 MG/ML IJ SOLN
0.5000 mg | INTRAMUSCULAR | Status: DC | PRN
  Administered 2017-10-26: 1 mg via INTRAVENOUS

## 2017-10-26 MED ORDER — MIDAZOLAM HCL 2 MG/2ML IJ SOLN
1.0000 mg | INTRAMUSCULAR | Status: DC
  Administered 2017-10-26: 1 mg via INTRAVENOUS
  Filled 2017-10-26: qty 2

## 2017-10-26 MED ORDER — CHLORHEXIDINE GLUCONATE 4 % EX LIQD: 60.0000 mL | Freq: Once | CUTANEOUS | Status: DC

## 2017-10-26 MED ORDER — DEXAMETHASONE SODIUM PHOSPHATE 10 MG/ML IJ SOLN
10.0000 mg | Freq: Once | INTRAMUSCULAR | Status: AC
  Administered 2017-10-26: 10 mg via INTRAVENOUS

## 2017-10-26 MED ORDER — BUPIVACAINE LIPOSOME 1.3 % IJ SUSP
INTRAMUSCULAR | Status: DC | PRN
  Administered 2017-10-26: 20 mL

## 2017-10-26 MED ORDER — RIVAROXABAN 10 MG PO TABS
10.0000 mg | ORAL_TABLET | Freq: Every day | ORAL | Status: DC
  Administered 2017-10-27: 10 mg via ORAL
  Filled 2017-10-26: qty 1

## 2017-10-26 MED ORDER — DOCUSATE SODIUM 100 MG PO CAPS
100.0000 mg | ORAL_CAPSULE | Freq: Two times a day (BID) | ORAL | Status: DC
  Administered 2017-10-27: 10:00:00 100 mg via ORAL
  Filled 2017-10-26: qty 1

## 2017-10-26 MED ORDER — PANTOPRAZOLE SODIUM 40 MG PO TBEC
40.0000 mg | DELAYED_RELEASE_TABLET | Freq: Every day | ORAL | Status: DC
  Administered 2017-10-27: 07:00:00 40 mg via ORAL
  Filled 2017-10-26: qty 1

## 2017-10-26 MED ORDER — ONDANSETRON HCL 4 MG/2ML IJ SOLN: 4.0000 mg | Freq: Four times a day (QID) | INTRAMUSCULAR | Status: DC | PRN

## 2017-10-26 MED ORDER — TRANEXAMIC ACID 1000 MG/10ML IV SOLN
1000.0000 mg | Freq: Once | INTRAVENOUS | Status: AC
  Administered 2017-10-26: 1000 mg via INTRAVENOUS
  Filled 2017-10-26: qty 10
  Filled 2017-10-26: qty 1100

## 2017-10-26 MED ORDER — FLEET ENEMA 7-19 GM/118ML RE ENEM: 1.0000 | ENEMA | Freq: Once | RECTAL | Status: DC | PRN

## 2017-10-26 MED ORDER — ONDANSETRON HCL 4 MG/2ML IJ SOLN
INTRAMUSCULAR | Status: AC
  Filled 2017-10-26: qty 2

## 2017-10-26 MED ORDER — SODIUM CHLORIDE 0.9 % IJ SOLN
INTRAMUSCULAR | Status: DC | PRN
  Administered 2017-10-26: 60 mL

## 2017-10-26 MED ORDER — VANCOMYCIN HCL IN DEXTROSE 1-5 GM/200ML-% IV SOLN
1000.0000 mg | Freq: Once | INTRAVENOUS | Status: AC
  Administered 2017-10-27: 07:00:00 1000 mg via INTRAVENOUS
  Filled 2017-10-26 (×2): qty 200

## 2017-10-26 MED ORDER — LACTATED RINGERS IV SOLN
INTRAVENOUS | Status: DC
  Administered 2017-10-26 (×2): via INTRAVENOUS

## 2017-10-26 MED ORDER — PROPOFOL 10 MG/ML IV BOLUS
INTRAVENOUS | Status: AC
  Filled 2017-10-26: qty 60

## 2017-10-26 MED ORDER — ONDANSETRON HCL 4 MG PO TABS: 4.0000 mg | ORAL_TABLET | Freq: Four times a day (QID) | ORAL | Status: DC | PRN

## 2017-10-26 MED ORDER — MENTHOL 3 MG MT LOZG: 1.0000 | LOZENGE | OROMUCOSAL | Status: DC | PRN

## 2017-10-26 MED ORDER — GABAPENTIN 300 MG PO CAPS
300.0000 mg | ORAL_CAPSULE | Freq: Once | ORAL | Status: AC
  Administered 2017-10-26: 300 mg via ORAL
  Filled 2017-10-26: qty 1

## 2017-10-26 MED ORDER — STERILE WATER FOR IRRIGATION IR SOLN
Status: DC | PRN
  Administered 2017-10-26: 2000 mL

## 2017-10-26 MED ORDER — FUROSEMIDE 20 MG PO TABS: 20.0000 mg | ORAL_TABLET | Freq: Every day | ORAL | Status: DC | PRN

## 2017-10-26 MED ORDER — ONDANSETRON HCL 4 MG/2ML IJ SOLN
INTRAMUSCULAR | Status: DC | PRN
  Administered 2017-10-26: 4 mg via INTRAVENOUS

## 2017-10-26 MED ORDER — ROPIVACAINE HCL 7.5 MG/ML IJ SOLN
INTRAMUSCULAR | Status: DC | PRN
  Administered 2017-10-26: 20 mL via PERINEURAL

## 2017-10-26 MED ORDER — METOCLOPRAMIDE HCL 5 MG PO TABS: 5.0000 mg | ORAL_TABLET | Freq: Three times a day (TID) | ORAL | Status: DC | PRN

## 2017-10-26 MED ORDER — GABAPENTIN 300 MG PO CAPS
300.0000 mg | ORAL_CAPSULE | Freq: Three times a day (TID) | ORAL | Status: DC
  Administered 2017-10-26 – 2017-10-27 (×4): 300 mg via ORAL
  Filled 2017-10-26 (×4): qty 1

## 2017-10-26 MED ORDER — METOCLOPRAMIDE HCL 5 MG/ML IJ SOLN: 5.0000 mg | Freq: Three times a day (TID) | INTRAMUSCULAR | Status: DC | PRN

## 2017-10-26 MED ORDER — BISACODYL 10 MG RE SUPP: 10.0000 mg | Freq: Every day | RECTAL | Status: DC | PRN

## 2017-10-26 MED ORDER — SODIUM CHLORIDE 0.9 % IV SOLN
INTRAVENOUS | Status: DC
  Administered 2017-10-26: 19:00:00 via INTRAVENOUS

## 2017-10-26 MED ORDER — PROMETHAZINE HCL 25 MG/ML IJ SOLN: 6.2500 mg | INTRAMUSCULAR | Status: DC | PRN

## 2017-10-26 MED ORDER — DEXAMETHASONE SODIUM PHOSPHATE 10 MG/ML IJ SOLN
INTRAMUSCULAR | Status: AC
  Filled 2017-10-26: qty 1

## 2017-10-26 MED ORDER — FENTANYL CITRATE (PF) 100 MCG/2ML IJ SOLN
50.0000 ug | INTRAMUSCULAR | Status: DC
  Administered 2017-10-26: 50 ug via INTRAVENOUS
  Filled 2017-10-26: qty 2

## 2017-10-26 MED ORDER — PHENYLEPHRINE 40 MCG/ML (10ML) SYRINGE FOR IV PUSH (FOR BLOOD PRESSURE SUPPORT)
PREFILLED_SYRINGE | INTRAVENOUS | Status: DC | PRN
  Administered 2017-10-26: 80 ug via INTRAVENOUS

## 2017-10-26 MED ORDER — FERROUS SULFATE 325 (65 FE) MG PO TABS
325.0000 mg | ORAL_TABLET | Freq: Every day | ORAL | Status: DC
  Administered 2017-10-27: 08:00:00 325 mg via ORAL
  Filled 2017-10-26: qty 1

## 2017-10-26 MED ORDER — METHOCARBAMOL 1000 MG/10ML IJ SOLN
500.0000 mg | Freq: Four times a day (QID) | INTRAVENOUS | Status: DC | PRN
  Filled 2017-10-26: qty 5

## 2017-10-26 MED ORDER — DIPHENHYDRAMINE HCL 12.5 MG/5ML PO ELIX: 12.5000 mg | ORAL_SOLUTION | ORAL | Status: DC | PRN

## 2017-10-26 MED ORDER — HYDROMORPHONE HCL 1 MG/ML IJ SOLN: 0.2500 mg | INTRAMUSCULAR | Status: DC | PRN

## 2017-10-26 MED ORDER — VANCOMYCIN HCL IN DEXTROSE 1-5 GM/200ML-% IV SOLN
1000.0000 mg | INTRAVENOUS | Status: AC
  Administered 2017-10-26: 1000 mg via INTRAVENOUS
  Filled 2017-10-26: qty 200

## 2017-10-26 MED ORDER — PHENOL 1.4 % MT LIQD
1.0000 | OROMUCOSAL | Status: DC | PRN
  Filled 2017-10-26: qty 177

## 2017-10-26 SURGICAL SUPPLY — 53 items
BAG DECANTER FOR FLEXI CONT (MISCELLANEOUS) ×1 IMPLANT
BAG SPEC THK2 15X12 ZIP CLS (MISCELLANEOUS) ×1
BAG ZIPLOCK 12X15 (MISCELLANEOUS) ×3 IMPLANT
BANDAGE ACE 6X5 VEL STRL LF (GAUZE/BANDAGES/DRESSINGS) ×3 IMPLANT
BLADE SAG 18X100X1.27 (BLADE) ×3 IMPLANT
BLADE SAW SGTL 11.0X1.19X90.0M (BLADE) ×3 IMPLANT
BOWL SMART MIX CTS (DISPOSABLE) ×3 IMPLANT
CAPT KNEE TOTAL 3 ATTUNE ×2 IMPLANT
CEMENT HV SMART SET (Cement) ×4 IMPLANT
CLOSURE WOUND 1/2 X4 (GAUZE/BANDAGES/DRESSINGS) ×1
COVER SURGICAL LIGHT HANDLE (MISCELLANEOUS) ×3 IMPLANT
CUFF TOURN SGL QUICK 34 (TOURNIQUET CUFF) ×3
CUFF TRNQT CYL 34X4X40X1 (TOURNIQUET CUFF) ×1 IMPLANT
DECANTER SPIKE VIAL GLASS SM (MISCELLANEOUS) ×3 IMPLANT
DRAPE TOP 10253 STERILE (DRAPES) IMPLANT
DRAPE U-SHAPE 47X51 STRL (DRAPES) ×3 IMPLANT
DRSG ADAPTIC 3X8 NADH LF (GAUZE/BANDAGES/DRESSINGS) ×3 IMPLANT
DRSG PAD ABDOMINAL 8X10 ST (GAUZE/BANDAGES/DRESSINGS) ×3 IMPLANT
DURAPREP 26ML APPLICATOR (WOUND CARE) ×3 IMPLANT
ELECT REM PT RETURN 15FT ADLT (MISCELLANEOUS) ×3 IMPLANT
EVACUATOR 1/8 PVC DRAIN (DRAIN) ×3 IMPLANT
GAUZE SPONGE 4X4 12PLY STRL (GAUZE/BANDAGES/DRESSINGS) ×3 IMPLANT
GLOVE BIO SURGEON STRL SZ7.5 (GLOVE) IMPLANT
GLOVE BIO SURGEON STRL SZ8 (GLOVE) ×3 IMPLANT
GLOVE BIOGEL PI IND STRL 6.5 (GLOVE) IMPLANT
GLOVE BIOGEL PI IND STRL 7.0 (GLOVE) IMPLANT
GLOVE BIOGEL PI IND STRL 8 (GLOVE) ×1 IMPLANT
GLOVE BIOGEL PI INDICATOR 6.5 (GLOVE) ×2
GLOVE BIOGEL PI INDICATOR 7.0 (GLOVE) ×8
GLOVE BIOGEL PI INDICATOR 8 (GLOVE) ×2
GLOVE SURG SS PI 6.5 STRL IVOR (GLOVE) ×2 IMPLANT
GOWN STRL REUS W/TWL LRG LVL3 (GOWN DISPOSABLE) ×5 IMPLANT
GOWN STRL REUS W/TWL XL LVL3 (GOWN DISPOSABLE) ×4 IMPLANT
HANDPIECE INTERPULSE COAX TIP (DISPOSABLE) ×3
IMMOBILIZER KNEE 20 (SOFTGOODS) ×3
IMMOBILIZER KNEE 20 THIGH 36 (SOFTGOODS) ×1 IMPLANT
MANIFOLD NEPTUNE II (INSTRUMENTS) ×3 IMPLANT
NS IRRIG 1000ML POUR BTL (IV SOLUTION) ×3 IMPLANT
PACK TOTAL KNEE CUSTOM (KITS) ×3 IMPLANT
PADDING CAST COTTON 6X4 STRL (CAST SUPPLIES) ×5 IMPLANT
POSITIONER SURGICAL ARM (MISCELLANEOUS) ×3 IMPLANT
SET HNDPC FAN SPRY TIP SCT (DISPOSABLE) ×1 IMPLANT
STRIP CLOSURE SKIN 1/2X4 (GAUZE/BANDAGES/DRESSINGS) ×3 IMPLANT
SUT MNCRL AB 4-0 PS2 18 (SUTURE) ×3 IMPLANT
SUT STRATAFIX 0 PDS 27 VIOLET (SUTURE) ×3
SUT VIC AB 2-0 CT1 27 (SUTURE) ×9
SUT VIC AB 2-0 CT1 TAPERPNT 27 (SUTURE) ×3 IMPLANT
SUTURE STRATFX 0 PDS 27 VIOLET (SUTURE) ×1 IMPLANT
SYR 30ML LL (SYRINGE) ×6 IMPLANT
TRAY FOLEY CATH 14FR (SET/KITS/TRAYS/PACK) ×2 IMPLANT
WATER STERILE IRR 1000ML POUR (IV SOLUTION) ×6 IMPLANT
WRAP KNEE MAXI GEL POST OP (GAUZE/BANDAGES/DRESSINGS) ×3 IMPLANT
YANKAUER SUCT BULB TIP 10FT TU (MISCELLANEOUS) ×3 IMPLANT

## 2017-10-26 NOTE — Discharge Instructions (Addendum)
° °Dr. Frank Aluisio °Total Joint Specialist °[facility] °[address] °[place], [place] [address] °[phone] ° °TOTAL KNEE REPLACEMENT POSTOPERATIVE DIRECTIONS ° °Knee Rehabilitation, Guidelines Following Surgery  °Results after knee surgery are often greatly improved when you follow the exercise, range of motion and muscle strengthening exercises prescribed by your doctor. Safety measures are also important to protect the knee from further injury. Any time any of these exercises cause you to have increased pain or swelling in your knee joint, decrease the amount until you are comfortable again and slowly increase them. If you have problems or questions, call your caregiver or physical therapist for advice.  ° °HOME CARE INSTRUCTIONS  °Remove items at home which could result in a fall. This includes throw rugs or furniture in walking pathways.  °· ICE to the affected knee every three hours for 30 minutes at a time and then as needed for pain and swelling.  Continue to use ice on the knee for pain and swelling from surgery. You may notice swelling that will progress down to the foot and ankle.  This is normal after surgery.  Elevate the leg when you are not up walking on it.   °· Continue to use the breathing machine which will help keep your temperature down.  It is common for your temperature to cycle up and down following surgery, especially at night when you are not up moving around and exerting yourself.  The breathing machine keeps your lungs expanded and your temperature down. °· Do not place pillow under knee, focus on keeping the knee straight while resting ° °DIET °You may resume your previous home diet once your are discharged from the hospital. ° °DRESSING / WOUND CARE / SHOWERING °You may shower 3 days after surgery, but keep the wounds dry during showering.  You may use an occlusive plastic wrap (Press'n Seal for example), NO SOAKING/SUBMERGING IN THE BATHTUB.  If the bandage gets  wet, change with a clean dry gauze.  If the incision gets wet, pat the wound dry with a clean towel. °You may start showering once you are discharged home but do not submerge the incision under water. Just pat the incision dry and apply a dry gauze dressing on daily. °Change the surgical dressing daily and reapply a dry dressing each time. ° °ACTIVITY °Walk with your walker as instructed. °Use walker as long as suggested by your caregivers. °Avoid periods of inactivity such as sitting longer than an hour when not asleep. This helps prevent blood clots.  °You may resume a sexual relationship in one month or when given the OK by your doctor.  °You may return to work once you are cleared by your doctor.  °Do not drive a car for 6 weeks or until released by you surgeon.  °Do not drive while taking narcotics. ° °WEIGHT BEARING °Weight bearing as tolerated with assist device (walker, cane, etc) as directed, use it as long as suggested by your surgeon or therapist, typically at least 4-6 weeks. ° °POSTOPERATIVE CONSTIPATION PROTOCOL °Constipation - defined medically as fewer than three stools per week and severe constipation as less than one stool per week. ° °One of the most common issues patients have following surgery is constipation.  Even if you have a regular bowel pattern at home, your normal regimen is likely to be disrupted due to multiple reasons following surgery.  Combination of anesthesia, postoperative narcotics, change in appetite and fluid intake all can affect your bowels.    In order to avoid complications following surgery, here are some recommendations in order to help you during your recovery period. ° °Colace (docusate) - Pick up an over-the-counter form of Colace or another stool softener and take twice a day as long as you are requiring postoperative pain medications.  Take with a full glass of water daily.  If you experience loose stools or diarrhea, hold the colace until you stool forms back up.  If  your symptoms do not get better within 1 week or if they get worse, check with your doctor. ° °Dulcolax (bisacodyl) - Pick up over-the-counter and take as directed by the product packaging as needed to assist with the movement of your bowels.  Take with a full glass of water.  Use this product as needed if not relieved by Colace only.  ° °MiraLax (polyethylene glycol) - Pick up over-the-counter to have on hand.  MiraLax is a solution that will increase the amount of water in your bowels to assist with bowel movements.  Take as directed and can mix with a glass of water, juice, soda, coffee, or tea.  Take if you go more than two days without a movement. °Do not use MiraLax more than once per day. Call your doctor if you are still constipated or irregular after using this medication for 7 days in a row. ° °If you continue to have problems with postoperative constipation, please contact the office for further assistance and recommendations.  If you experience "the worst abdominal pain ever" or develop nausea or vomiting, please contact the office immediatly for further recommendations for treatment. ° °ITCHING ° If you experience itching with your medications, try taking only a single pain pill, or even half a pain pill at a time.  You can also use Benadryl over the counter for itching or also to help with sleep.  ° °TED HOSE STOCKINGS °Wear the elastic stockings on both legs for three weeks following surgery during the day but you may remove then at night for sleeping. ° °MEDICATIONS °See your medication summary on the “After Visit Summary” that the nursing staff will review with you prior to discharge.  You may have some home medications which will be placed on hold until you complete the course of blood thinner medication.  It is important for you to complete the blood thinner medication as prescribed by your surgeon.  Continue your approved medications as instructed at time of discharge. ° °PRECAUTIONS °If you  experience chest pain or shortness of breath - call 911 immediately for transfer to the hospital emergency department.  °If you develop a fever greater that 101 F, purulent drainage from wound, increased redness or drainage from wound, foul odor from the wound/dressing, or calf pain - CONTACT YOUR SURGEON.   °                                                °FOLLOW-UP APPOINTMENTS °Make sure you keep all of your appointments after your operation with your surgeon and caregivers. You should call the office at the above phone number and make an appointment for approximately two weeks after the date of your surgery or on the date instructed by your surgeon outlined in the "After Visit Summary". ° ° °RANGE OF MOTION AND STRENGTHENING EXERCISES  °Rehabilitation of the knee is important following a knee injury or   an operation. After just a few days of immobilization, the muscles of the thigh which control the knee become weakened and shrink (atrophy). Knee exercises are designed to build up the tone and strength of the thigh muscles and to improve knee motion. Often times heat used for twenty to thirty minutes before working out will loosen up your tissues and help with improving the range of motion but do not use heat for the first two weeks following surgery. These exercises can be done on a training (exercise) mat, on the floor, on a table or on a bed. Use what ever works the best and is most comfortable for you Knee exercises include:  °Leg Lifts - While your knee is still immobilized in a splint or cast, you can do straight leg raises. Lift the leg to 60 degrees, hold for 3 sec, and slowly lower the leg. Repeat 10-20 times 2-3 times daily. Perform this exercise against resistance later as your knee gets better.  °Quad and Hamstring Sets - Tighten up the muscle on the front of the thigh (Quad) and hold for 5-10 sec. Repeat this 10-20 times hourly. Hamstring sets are done by pushing the foot backward against an object  and holding for 5-10 sec. Repeat as with quad sets.  °· Leg Slides: Lying on your back, slowly slide your foot toward your buttocks, bending your knee up off the floor (only go as far as is comfortable). Then slowly slide your foot back down until your leg is flat on the floor again. °· Angel Wings: Lying on your back spread your legs to the side as far apart as you can without causing discomfort.  °A rehabilitation program following serious knee injuries can speed recovery and prevent re-injury in the future due to weakened muscles. Contact your doctor or a physical therapist for more information on knee rehabilitation.  ° °IF YOU ARE TRANSFERRED TO A SKILLED REHAB FACILITY °If the patient is transferred to a skilled rehab facility following release from the hospital, a list of the current medications will be sent to the facility for the patient to continue.  When discharged from the skilled rehab facility, please have the facility set up the patient's Home Health Physical Therapy prior to being released. Also, the skilled facility will be responsible for providing the patient with their medications at time of release from the facility to include their pain medication, the muscle relaxants, and their blood thinner medication. If the patient is still at the rehab facility at time of the two week follow up appointment, the skilled rehab facility will also need to assist the patient in arranging follow up appointment in our office and any transportation needs. ° °MAKE SURE YOU:  °Understand these instructions.  °Get help right away if you are not doing well or get worse.  ° ° °Pick up stool softner and laxative for home use following surgery while on pain medications. °Do not submerge incision under water. °Please use good hand washing techniques while changing dressing each day. °May shower starting three days after surgery. °Please use a clean towel to pat the incision dry following showers. °Continue to use ice for  pain and swelling after surgery. °Do not use any lotions or creams on the incision until instructed by your surgeon. ° °Take Xarelto for two and a half more weeks following discharge from the hospital, then discontinue Xarelto. °Once the patient has completed the blood thinner regimen, then take a Baby 81 mg Aspirin daily for three   more weeks. ° ° ° °Information on my medicine - XARELTO® (Rivaroxaban) ° °Why was Xarelto® prescribed for you? °Xarelto® was prescribed for you to reduce the risk of blood clots forming after orthopedic surgery. The medical term for these abnormal blood clots is venous thromboembolism (VTE). ° °What do you need to know about xarelto® ? °Take your Xarelto® ONCE DAILY at the same time every day. °You may take it either with or without food. ° °If you have difficulty swallowing the tablet whole, you may crush it and mix in applesauce just prior to taking your dose. ° °Take Xarelto® exactly as prescribed by your doctor and DO NOT stop taking Xarelto® without talking to the doctor who prescribed the medication.  Stopping without other VTE prevention medication to take the place of Xarelto® may increase your risk of developing a clot. ° °After discharge, you should have regular check-up appointments with your healthcare provider that is prescribing your Xarelto®.   ° °What do you do if you miss a dose? °If you miss a dose, take it as soon as you remember on the same day then continue your regularly scheduled once daily regimen the next day. Do not take two doses of Xarelto® on the same day.  ° °Important Safety Information °A possible side effect of Xarelto® is bleeding. You should call your healthcare provider right away if you experience any of the following: °? Bleeding from an injury or your nose that does not stop. °? Unusual colored urine (red or dark brown) or unusual colored stools (red or black). °? Unusual bruising for unknown reasons. °? A serious fall or if you hit your head (even  if there is no bleeding). ° °Some medicines may interact with Xarelto® and might increase your risk of bleeding while on Xarelto®. To help avoid this, consult your healthcare provider or pharmacist prior to using any new prescription or non-prescription medications, including herbals, vitamins, non-steroidal anti-inflammatory drugs (NSAIDs) and supplements. ° °This website has more information on Xarelto®: www.xarelto.com. ° ° ° °

## 2017-10-26 NOTE — Anesthesia Procedure Notes (Signed)
Spinal  Patient location during procedure: OR Start time: 10/26/2017 9:20 AM End time: 10/26/2017 9:23 AM Staffing Anesthesiologist: Eilene Ghaziose, George, MD Resident/CRNA: Epimenio SarinJarvela,  R, CRNA Performed: resident/CRNA  Preanesthetic Checklist Completed: patient identified, surgical consent, pre-op evaluation, timeout performed, IV checked, risks and benefits discussed and monitors and equipment checked Spinal Block Patient position: sitting Prep: DuraPrep Patient monitoring: heart rate, cardiac monitor, continuous pulse ox and blood pressure Approach: midline Location: L3-4 Injection technique: single-shot Needle Needle type: Pencan  Needle gauge: 24 G Needle length: 10 cm Needle insertion depth: 7 cm Assessment Sensory level: T6

## 2017-10-26 NOTE — Transfer of Care (Signed)
Immediate Anesthesia Transfer of Care Note  Patient: [name] [name]  Procedure(s) Performed: RIGHT TOTAL KNEE ARTHROPLASTY (Right Knee)  Patient Location: PACU  Anesthesia Type:Spinal  Level of Consciousness: drowsy and patient cooperative  Airway & Oxygen Therapy: Patient Spontanous Breathing and Patient connected to face mask oxygen  Post-op Assessment: Report given to RN and Post -op Vital signs reviewed and stable  Post vital signs: Reviewed and stable  Last Vitals:  Vitals Value Taken Time  BP    Temp    Pulse    Resp    SpO2      Last Pain:  Vitals:   10/26/17 0740  TempSrc:   PainSc: 0-No pain         Complications: No apparent anesthesia complications

## 2017-10-26 NOTE — Anesthesia Preprocedure Evaluation (Signed)
Anesthesia Evaluation  Patient identified by MRN, date of birth, ID band Patient awake    Reviewed: Allergy & Precautions, NPO status , Patient's Chart, lab work & pertinent test results  Airway Mallampati: II  TM Distance: >3 FB Neck ROM: Full    Dental no notable dental hx.    Pulmonary neg pulmonary ROS,    Pulmonary exam normal breath sounds clear to auscultation       Cardiovascular negative cardio ROS Normal cardiovascular exam Rhythm:Regular Rate:Normal     Neuro/Psych negative neurological ROS  negative psych ROS   GI/Hepatic negative GI ROS, Neg liver ROS,   Endo/Other  negative endocrine ROS  Renal/GU negative Renal ROS  negative genitourinary   Musculoskeletal  (+) Arthritis ,   Abdominal   Peds negative pediatric ROS (+)  Hematology negative hematology ROS (+)   Anesthesia Other Findings   Reproductive/Obstetrics negative OB ROS                            Anesthesia Physical Anesthesia Plan  ASA: II  Anesthesia Plan: Spinal   Post-op Pain Management:    Induction: Intravenous  PONV Risk Score and Plan: Ondansetron and Dexamethasone  Airway Management Planned: Simple Face Mask  Additional Equipment:   Intra-op Plan:   Post-operative Plan:   Informed Consent: I have reviewed the patients History and Physical, chart, labs and discussed the procedure including the risks, benefits and alternatives for the proposed anesthesia with the patient or authorized representative who has indicated his/her understanding and acceptance.   Dental advisory given  Plan Discussed with: CRNA and Surgeon  Anesthesia Plan Comments:         Anesthesia Quick Evaluation

## 2017-10-26 NOTE — Progress Notes (Signed)
Pharmacy Antibiotic Note  [name] [name] is a 57 y.o. female admitted on 10/26/2017 with surgical prophylaxis.  Pharmacy has been consulted for vancomycin dosing.  Vancomycin per pharmacy notes: received preop dose but no order for postop abx., to cover 24hr after surgery.  Plan: Vancomycin 1gm iv x1 at 0600 3/26 Pharmacy will sign off, as this will be only a one time post surgical dose.   Height: 5\' 5"  (165.1 cm) Weight: 186 lb 12.8 oz (84.7 kg) IBW/kg (Calculated) : 57  Temp (24hrs), Avg:97.7 F (36.5 C), Min:97.5 F (36.4 C), Max:98.1 F (36.7 C)  Recent Labs  Lab 10/20/17 1422  WBC 6.8  CREATININE 0.84    Estimated Creatinine Clearance: 79.4 mL/min (by C-G formula based on SCr of 0.84 mg/dL).    Allergies  Allergen Reactions  . Acetaminophen Other (See Comments)    Very sleepy  . Penicillins Hives and Other (See Comments)    FEB 2018 - broke out in hives Has patient had a PCN reaction causing immediate rash, facial/tongue/throat swelling, SOB or lightheadedness with hypotension: Yes Has patient had a PCN reaction causing severe rash involving mucus membranes or skin necrosis: No Has patient had a PCN reaction that required hospitalization: No Has patient had a PCN reaction occurring within the last 10 years: Yes If all of the above answers are "NO", then may proceed with Cephalosporin use.   . Topamax [Topiramate] Other (See Comments)    Kidney stones    Antimicrobials this admission: Vancomycin 10/26/2017 >> 10/27/2017   Dose adjustments this admission: -  Microbiology results: -  Thank you for allowing pharmacy to be a part of this patient's care.  Aleene DavidsonGrimsley Jr,  [place] 10/26/2017 11:11 PM

## 2017-10-26 NOTE — Anesthesia Procedure Notes (Signed)
Anesthesia Procedure Image    

## 2017-10-26 NOTE — Evaluation (Signed)
Physical Therapy Evaluation Patient Details Name: [name] [name] MRN: [id number] DOB: 01/16/1961 Today's Date: 10/26/2017   History of Present Illness  R TKA, H/O LTKA  Clinical Impression  The patient ambulated x 50'. Plans Dc home with family assisting. Pt admitted with above diagnosis. Pt currently with functional limitations due to the deficits listed below (see PT Problem List). Pt will benefit from skilled PT to increase their independence and safety with mobility to allow discharge to the venue listed below.       Follow Up Recommendations Follow surgeon's recommendation for DC plan and follow-up therapies    Equipment Recommendations  None recommended by PT    Recommendations for Other Services       Precautions / Restrictions Precautions Precautions: Knee Required Braces or Orthoses: Knee Immobilizer - Right Knee Immobilizer - Right: Discontinue once straight leg raise with < 10 degree lag      Mobility  Bed Mobility Overal bed mobility: Needs Assistance Bed Mobility: Supine to Sit;Sit to Supine     Supine to sit: Min guard Sit to supine: Min guard      Transfers Overall transfer level: Needs assistance Equipment used: Rolling walker (2 wheeled) Transfers: Sit to/from Stand Sit to Stand: Min assist         General transfer comment: cues for safety and technique, patient is impulsive  Ambulation/Gait Ambulation/Gait assistance: Min assist Ambulation Distance (Feet): 50 Feet Assistive device: Rolling walker (2 wheeled) Gait Pattern/deviations: Step-to pattern;Antalgic     General Gait Details: cues for sequence and safety  Stairs            Wheelchair Mobility    Modified Rankin (Stroke Patients Only)       Balance                                             Pertinent Vitals/Pain Pain Assessment: 0-10 Pain Score: 6  Pain Location: right knee Pain Descriptors / Indicators: Discomfort;Dull Pain  Intervention(s): Monitored during session;Premedicated before session;Limited activity within patient's tolerance;Ice applied    Home Living Family/patient expects to be discharged to:: Private residence Living Arrangements: Children Available Help at Discharge: Family Type of Home: Mobile home Home Access: Stairs to enter Entrance Stairs-Rails: Right;Left;Can reach both Entrance Stairs-Number of Steps: 5-6   Home Equipment: Walker - 2 wheels;Crutches;Cane - single point Additional Comments: high tub; Dr Despina Hick told pt not to shower for 6-8 weeks    Prior Function Level of Independence: Independent         Comments: very active,     Hand Dominance        Extremity/Trunk Assessment   Upper Extremity Assessment Upper Extremity Assessment: Overall WFL for tasks assessed         Cervical / Trunk Assessment Cervical / Trunk Assessment: Normal  Communication   Communication: No difficulties  Cognition Arousal/Alertness: Awake/alert Behavior During Therapy: Impulsive Overall Cognitive Status: Within Functional Limits for tasks assessed                                        General Comments      Exercises     Assessment/Plan    PT Assessment    PT Problem List         PT Treatment Interventions  PT Goals (Current goals can be found in the Care Plan section)  Acute Rehab PT Goals 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 : A Little Difficulty sitting down on and standing up from a chair with arms (e.g., wheelchair, bedside commode,  etc,.)?: A Lot Help needed moving to and from a bed to chair (including a wheelchair)?: A Lot Help needed walking in hospital room?: A Lot Help needed climbing 3-5 steps with a railing? : Total 6 Click Score: 13    End of Session Equipment Utilized During Treatment: Right knee immobilizer Activity Tolerance: Patient tolerated treatment well Patient left: in bed;in CPM;with call bell/phone within reach   PT Visit Diagnosis: Unsteadiness on feet (R26.81);Pain Pain - Right/Left: Right Pain - part of body: Knee    Time: [id number] PT Time Calculation (min) (ACUTE ONLY): 26 min   Charges:     PT Treatments $Gait Training: 8-22 mins   PT G CodesBlanchard Kelch:          PT [phone]   Rada Hay,  Elizabeth 10/26/2017, 6:24 PM

## 2017-10-26 NOTE — Progress Notes (Signed)
Assisted Dr. Rose with right, ultrasound guided, adductor canal block. Side rails up, monitors on throughout procedure. See vital signs in flow sheet. Tolerated Procedure well.  

## 2017-10-26 NOTE — Transfer of Care (Deleted)
Immediate Anesthesia Transfer of Care Note  Patient: [name] [name]  Procedure(s) Performed: RIGHT TOTAL KNEE ARTHROPLASTY (Right Knee)  Patient Location: PACU  Anesthesia Type:Spinal  Level of Consciousness: awake and patient cooperative  Airway & Oxygen Therapy: Patient Spontanous Breathing and Patient connected to face mask oxygen  Post-op Assessment: Report given to RN and Post -op Vital signs reviewed and stable  Post vital signs: Reviewed and stable  Last Vitals:  Vitals Value Taken Time  BP 137/74 10/26/2017  8:48 AM  Temp    Pulse 58 10/26/2017  8:50 AM  Resp 14 10/26/2017  8:50 AM  SpO2 99 % 10/26/2017  8:50 AM  Vitals shown include unvalidated device data.  Last Pain:  Vitals:   10/26/17 0740  TempSrc:   PainSc: 0-No pain         Complications: No apparent anesthesia complications

## 2017-10-26 NOTE — Op Note (Signed)
OPERATIVE REPORT-TOTAL KNEE ARTHROPLASTY   Pre-operative diagnosis- Osteoarthritis  Right knee(s)  Post-operative diagnosis- Osteoarthritis Right knee(s)  Procedure-  Right  Total Knee Arthroplasty (Depuy Attune)  Surgeon- Dione Plover. , MD  Assistant- Arlee Muslim, PA-C   Anesthesia-  Adductor canal block and spnal  EBL-50 mL   Drains Hemovac  Tourniquet time-  Total Tourniquet Time Documented: Thigh (Right) - 33 minutes Total: Thigh (Right) - 33 minutes     Complications- None  Condition-PACU - hemodynamically stable.   Brief Clinical Note  [name] [name] is a 57 y.o. year old female with end stage OA of her right knee with progressively worsening pain and dysfunction. She has constant pain, with activity and at rest and significant functional deficits with difficulties even with ADLs. She has had extensive non-op management including analgesics, injections of cortisone and viscosupplements, and home exercise program, but remains in significant pain with significant dysfunction.Radiographs show bone on bone arthritis medial and patellofemoral. She presents now for right Total Knee Arthroplasty.    Procedure in detail---   The patient is brought into the operating room and positioned supine on the operating table. After successful administration of   Adductor canal block and spinal,   a tourniquet is placed high on the  Right thigh(s) and the lower extremity is prepped and draped in the usual sterile fashion. Time out is performed by the operating team and then the  Right lower extremity is wrapped in Esmarch, knee flexed and the tourniquet inflated to 300 mmHg.       A midline incision is made with a ten blade through the subcutaneous tissue to the level of the extensor mechanism. A fresh blade is used to make a medial parapatellar arthrotomy. Soft tissue over the proximal medial tibia is subperiosteally elevated to the joint line with a knife and into the semimembranosus  bursa with a Cobb elevator. Soft tissue over the proximal lateral tibia is elevated with attention being paid to avoiding the patellar tendon on the tibial tubercle. The patella is everted, knee flexed 90 degrees and the ACL and PCL are removed. Findings are bone on bone medial and patellofemoral with large global ostoephytes.        The drill is used to create a starting hole in the distal femur and the canal is thoroughly irrigated with sterile saline to remove the fatty contents. The 5 degree Right  valgus alignment guide is placed into the femoral canal and the distal femoral cutting block is pinned to remove 10 mm off the distal femur. Resection is made with an oscillating saw.      The tibia is subluxed forward and the menisci are removed. The extramedullary alignment guide is placed referencing proximally at the medial aspect of the tibial tubercle and distally along the second metatarsal axis and tibial crest. The block is pinned to remove 55m off the more deficient medial  side. Resection is made with an oscillating saw. Size 4is the most appropriate size for the tibia and the proximal tibia is prepared with the modular drill and keel punch for that size.      The femoral sizing guide is placed and size 4 is most appropriate. Rotation is marked off the epicondylar axis and confirmed by creating a rectangular flexion gap at 90 degrees. The size 4 cutting block is pinned in this rotation and the anterior, posterior and chamfer cuts are made with the oscillating saw. The intercondylar block is then placed and that cut is  made.      Trial size 4 tibial component, trial size 4 narrow posterior stabilized femur and a 10  mm posterior stabilized rotating platform insert trial is placed. Full extension is achieved with excellent varus/valgus and anterior/posterior balance throughout full range of motion. The patella is everted and thickness measured to be 21  mm. Free hand resection is taken to 12 mm, a 35  template is placed, lug holes are drilled, trial patella is placed, and it tracks normally. Osteophytes are removed off the posterior femur with the trial in place. All trials are removed and the cut bone surfaces prepared with pulsatile lavage. Cement is mixed and once ready for implantation, the size 4 tibial implant, size  4 narrow posterior stabilized femoral component, and the size 35 patella are cemented in place and the patella is held with the clamp. The trial insert is placed and the knee held in full extension. The Exparel (20 ml mixed with 60 ml saline)is injected into the extensor mechanism, posterior capsule, medial and lateral gutters and subcutaneous tissues.  All extruded cement is removed and once the cement is hard the permanent 10 mm posterior stabilized rotating platform insert is placed into the tibial tray.      The wound is copiously irrigated with saline solution and the extensor mechanism closed over a hemovac drain with #1 V-loc suture. The tourniquet is released for a total tourniquet time of 33  minutes. Flexion against gravity is 140 degrees and the patella tracks normally. Subcutaneous tissue is closed with 2.0 vicryl and subcuticular with running 4.0 Monocryl. The incision is cleaned and dried and steri-strips and a bulky sterile dressing are applied. The limb is placed into a knee immobilizer and the patient is awakened and transported to recovery in stable condition.      Please note that a surgical assistant was a medical necessity for this procedure in order to perform it in a safe and expeditious manner. Surgical assistant was necessary to retract the ligaments and vital neurovascular structures to prevent injury to them and also necessary for proper positioning of the limb to allow for anatomic placement of the prosthesis.   Dione Plover , MD    10/26/2017, 10:19 AM

## 2017-10-26 NOTE — Anesthesia Procedure Notes (Signed)
Anesthesia Regional Block: Adductor canal block   Pre-Anesthetic Checklist: ,, timeout performed, Correct Patient, Correct Site, Correct Laterality, Correct Procedure, Correct Position, site marked, Risks and benefits discussed,  Surgical consent,  Pre-op evaluation,  At surgeon's request and post-op pain management  Laterality: Right  Prep: chloraprep       Needles:  Injection technique: Single-shot  Needle Type: Echogenic Needle     Needle Length: 9cm      Additional Needles:   Procedures:,,,, ultrasound used (permanent image in chart),,,,  Narrative:  Start time: 10/26/2017 8:40 AM End time: 10/26/2017 8:46 AM Injection made incrementally with aspirations every 5 mL.  Performed by: Personally  Anesthesiologist: Eilene Ghaziose, , MD  Additional Notes: Patient tolerated the procedure well without complications

## 2017-10-26 NOTE — Anesthesia Postprocedure Evaluation (Signed)
Anesthesia Post Note  Patient: [name] [name]  Procedure(s) Performed: RIGHT TOTAL KNEE ARTHROPLASTY (Right Knee)     Patient location during evaluation: PACU Anesthesia Type: Spinal Level of consciousness: oriented and awake and alert Pain management: pain level controlled Vital Signs Assessment: post-procedure vital signs reviewed and stable Respiratory status: spontaneous breathing, respiratory function stable and patient connected to nasal cannula oxygen Cardiovascular status: blood pressure returned to baseline and stable Postop Assessment: no headache, no backache and no apparent nausea or vomiting Anesthetic complications: no    Last Vitals:  Vitals:   10/26/17 1145 10/26/17 1200  BP: 134/70 124/71  Pulse: (!) 50 (!) 58  Resp: 12 16  Temp:    SpO2: 100% 100%    Last Pain:  Vitals:   10/26/17 1200  TempSrc:   PainSc: Asleep                 , S

## 2017-10-26 NOTE — Interval H&P Note (Signed)
History and Physical Interval Note:  10/26/2017 7:11 AM  [name] [name]  has presented today for surgery, with the diagnosis of Osteoarthritis Right Knee  The various methods of treatment have been discussed with the patient and family. After consideration of risks, benefits and other options for treatment, the patient has consented to  Procedure(s): RIGHT TOTAL KNEE ARTHROPLASTY (Right) as a surgical intervention .  The patient's history has been reviewed, patient examined, no change in status, stable for surgery.  I have reviewed the patient's chart and labs.  Questions were answered to the patient's satisfaction.     Pilar Plate 

## 2017-10-27 DIAGNOSIS — M1711 Unilateral primary osteoarthritis, right knee: Secondary | ICD-10-CM | POA: Diagnosis not present

## 2017-10-27 LAB — BASIC METABOLIC PANEL
ANION GAP: 8 (ref 5–15)
BUN: 11 mg/dL (ref 6–20)
CALCIUM: 9.4 mg/dL (ref 8.9–10.3)
CO2: 27 mmol/L (ref 22–32)
Chloride: 103 mmol/L (ref 101–111)
Creatinine, Ser: 0.63 mg/dL (ref 0.44–1.00)
Glucose, Bld: 146 mg/dL — ABNORMAL HIGH (ref 65–99)
Potassium: 4 mmol/L (ref 3.5–5.1)
Sodium: 138 mmol/L (ref 135–145)

## 2017-10-27 LAB — CBC
HEMATOCRIT: 37.5 % (ref 36.0–46.0)
Hemoglobin: 11.8 g/dL — ABNORMAL LOW (ref 12.0–15.0)
MCH: 29.6 pg (ref 26.0–34.0)
MCHC: 31.5 g/dL (ref 30.0–36.0)
MCV: 94 fL (ref 78.0–100.0)
PLATELETS: 197 10*3/uL (ref 150–400)
RBC: 3.99 MIL/uL (ref 3.87–5.11)
RDW: 14.4 % (ref 11.5–15.5)
WBC: 12.2 10*3/uL — AB (ref 4.0–10.5)

## 2017-10-27 MED ORDER — CALCIUM CARBONATE ANTACID 500 MG PO CHEW
1.0000 | CHEWABLE_TABLET | Freq: Four times a day (QID) | ORAL | Status: DC | PRN
  Administered 2017-10-27: 400 mg via ORAL
  Filled 2017-10-27: qty 2

## 2017-10-27 MED ORDER — RIVAROXABAN 10 MG PO TABS: 10.0000 mg | ORAL_TABLET | Freq: Every day | ORAL | 0 refills | Status: DC

## 2017-10-27 MED ORDER — TRAMADOL HCL 50 MG PO TABS: 50.0000 mg | ORAL_TABLET | Freq: Four times a day (QID) | ORAL | 0 refills | Status: DC | PRN

## 2017-10-27 MED ORDER — METHOCARBAMOL 500 MG PO TABS: 500.0000 mg | ORAL_TABLET | Freq: Four times a day (QID) | ORAL | 0 refills | Status: DC | PRN

## 2017-10-27 MED ORDER — OXYCODONE HCL 10 MG PO TABS: 10.0000 mg | ORAL_TABLET | ORAL | 0 refills | Status: DC | PRN

## 2017-10-27 MED ORDER — TRAMADOL HCL 50 MG PO TABS
50.0000 mg | ORAL_TABLET | Freq: Four times a day (QID) | ORAL | Status: DC | PRN
Start: 2017-10-27 — End: 2017-10-27
  Administered 2017-10-27: 50 mg via ORAL
  Filled 2017-10-27: qty 2

## 2017-10-27 NOTE — Care Management CC44 (Signed)
Condition Code 44 Documentation Completed  Patient Details  Name: [name] [name] MRN: [id number] Date of Birth: 1960/12/04   Condition Code 44 given:    Patient signature on Condition Code 44 notice:    Documentation of 2 MD's agreement:    Code 44 added to claim:       Alexis Goodelleele,  K, RN 10/27/2017, 11:10 AM

## 2017-10-27 NOTE — Progress Notes (Signed)
   Subjective: 1 Day Post-Op Procedure(s) (LRB): RIGHT TOTAL KNEE ARTHROPLASTY (Right) Patient reports pain as mild.   Patient seen in rounds for Dr. Lequita HaltAluisio. Sitting up in bed doing well. Patient is well, but has had some minor complaints of pain in the knee, requiring pain medications We will start therapy today.  If they do well with therapy and meets all goals, then will allow home later this afternoon following therapy. Plan is to go Home after hospital stay.  Objective: Vital signs in last 24 hours: Temp:  [97.5 F (36.4 C)-98.8 F (37.1 C)] 98.8 F (37.1 C) (03/26 0559) Pulse Rate:  [49-87] 68 (03/26 0559) Resp:  [12-19] 14 (03/26 0559) BP: (105-151)/(62-81) 139/70 (03/26 0559) SpO2:  [95 %-100 %] 95 % (03/26 0559)  Intake/Output from previous day:  Intake/Output Summary (Last 24 hours) at 10/27/2017 0904 Last data filed at 10/27/2017 0700 Gross per 24 hour  Intake 3730 ml  Output 5800 ml  Net -2070 ml    Intake/Output this shift: No intake/output data recorded.  Labs: Recent Labs    10/27/17 0613  HGB 11.8*   Recent Labs    10/27/17 0613  WBC 12.2*  RBC 3.99  HCT 37.5  PLT 197   Recent Labs    10/27/17 0613  NA 138  K 4.0  CL 103  CO2 27  BUN 11  CREATININE 0.63  GLUCOSE 146*  CALCIUM 9.4   No results for input(s): LABPT, INR in the last 72 hours.  EXAM General - Patient is Alert, Appropriate and Oriented Extremity - Neurovascular intact Sensation intact distally Intact pulses distally Dorsiflexion/Plantar flexion intact Dressing - dressing C/D/I Motor Function - intact, moving foot and toes well on exam.  Hemovac pulled without difficulty.  Past Medical History:  Diagnosis Date  . Anemia 07/2017   on meds  . Arthritis   . Black-out (not amnesia) 06/2017   black out spells due to anemia!  Marland Kitchen. Blood transfusion without reported diagnosis    Pt thinks she had 3 units blood in Dec, 2018  . GERD (gastroesophageal reflux disease)   .  History of kidney stones   . Kidney stones   . Migraine   . Restless leg syndrome   . Swelling of lower limb    bialteral lower leg swelling    Assessment/Plan: 1 Day Post-Op Procedure(s) (LRB): RIGHT TOTAL KNEE ARTHROPLASTY (Right) Active Problems:   OA (osteoarthritis) of knee  Estimated body mass index is 31.09 kg/m as calculated from the following:   Height as of this encounter: 5\' 5"  (1.651 m).   Weight as of this encounter: 84.7 kg (186 lb 12.8 oz). Up with therapy Discharge home - straight to outpatient therapy  DVT Prophylaxis - Xarelto Weight-Bearing as tolerated to right leg D/C O2 and Pulse OX and try on Room Air  If meets goals and able to go home: Diet - Regular diet Follow up - in 2 weeks Activity - WBAT Disposition - Home Condition Upon Discharge - pending therapy D/C Meds - See DC Summary DVT Prophylaxis - Xarelto  Avel Peacerew , PA-C Orthopaedic Surgery

## 2017-10-27 NOTE — Discharge Summary (Signed)
Physician Discharge Summary   Patient ID: [name] [name] MRN: [id number] DOB/AGE: 01-20-1961 57 y.o.  Admit date: 10/26/2017 Discharge date: 10-27-2017  Primary Diagnosis:  Osteoarthritis Right knee(s)   Admission Diagnoses:  Past Medical History:  Diagnosis Date  . Anemia 07/2017   on meds  . Arthritis   . Black-out (not amnesia) 06/2017   black out spells due to anemia!  Marland Kitchen Blood transfusion without reported diagnosis    Pt thinks she had 3 units blood in Dec, 2018  . GERD (gastroesophageal reflux disease)   . History of kidney stones   . Kidney stones   . Migraine   . Restless leg syndrome   . Swelling of lower limb    bialteral lower leg swelling   Discharge Diagnoses:   Active Problems:   OA (osteoarthritis) of knee  Estimated body mass index is 31.09 kg/m as calculated from the following:   Height as of this encounter: '5\' 5"'  (1.651 m).   Weight as of this encounter: 84.7 kg (186 lb 12.8 oz).  Procedure:  Procedure(s) (LRB): RIGHT TOTAL KNEE ARTHROPLASTY (Right)   Consults: None  HPI: Maeleigh TYREKA HENNEKE is a 57 y.o. year old female with end stage OA of her right knee with progressively worsening pain and dysfunction. She has constant pain, with activity and at rest and significant functional deficits with difficulties even with ADLs. She has had extensive non-op management including analgesics, injections of cortisone and viscosupplements, and home exercise program, but remains in significant pain with significant dysfunction.Radiographs show bone on bone arthritis medial and patellofemoral. She presents now for right Total Knee Arthroplasty.     Laboratory Data: Admission on 10/26/2017  Component Date Value Ref Range Status  . WBC 10/27/2017 12.2* 4.0 - 10.5 K/uL Final  . RBC 10/27/2017 3.99  3.87 - 5.11 MIL/uL Final  . Hemoglobin 10/27/2017 11.8* 12.0 - 15.0 g/dL Final  . HCT 10/27/2017 37.5  36.0 - 46.0 % Final  . MCV 10/27/2017 94.0  78.0 - 100.0 fL Final    . MCH 10/27/2017 29.6  26.0 - 34.0 pg Final  . MCHC 10/27/2017 31.5  30.0 - 36.0 g/dL Final  . RDW 10/27/2017 14.4  11.5 - 15.5 % Final  . Platelets 10/27/2017 197  150 - 400 K/uL Final   Performed at [facility], [place] [address], [place], [place] [address]  . Sodium 10/27/2017 138  135 - 145 mmol/L Final  . Potassium 10/27/2017 4.0  3.5 - 5.1 mmol/L Final  . Chloride 10/27/2017 103  101 - 111 mmol/L Final  . CO2 10/27/2017 27  22 - 32 mmol/L Final  . Glucose, Bld 10/27/2017 146* 65 - 99 mg/dL Final  . BUN 10/27/2017 11  6 - 20 mg/dL Final  . Creatinine, Ser 10/27/2017 0.63  0.44 - 1.00 mg/dL Final  . Calcium 10/27/2017 9.4  8.9 - 10.3 mg/dL Final  . GFR calc non Af Amer 10/27/2017 >60  >60 mL/min Final  . GFR calc Af Amer 10/27/2017 >60  >60 mL/min Final   Comment: (NOTE) The eGFR has been calculated using the CKD EPI equation. This calculation has not been validated in all clinical situations. eGFR's persistently <60 mL/min signify possible Chronic Kidney Disease.   Georgiann Hahn gap 10/27/2017 8  5 - 15 Final   Performed at [facility], [place] [address] [place], [place] [address]  Hospital Outpatient Visit on 10/20/2017  Component Date Value Ref Range Status  . MRSA, PCR  10/20/2017 NEGATIVE  NEGATIVE Final  . Staphylococcus aureus 10/20/2017 NEGATIVE  NEGATIVE Final   Comment: (NOTE) The Xpert SA Assay (FDA approved for NASAL specimens in patients 36 years of age and older), is one component of a comprehensive surveillance program. It is not intended to diagnose infection nor to guide or monitor treatment. Performed at [facility], [place] [address], [place], [place] [address]   . aPTT 10/20/2017 31  24 - 36 seconds Final   Performed at [facility], [place] [address], [place], [place] [address]  . WBC 10/20/2017 6.8  4.0 - 10.5 K/uL Final  . RBC 10/20/2017 4.66  3.87 - 5.11 MIL/uL Final  . Hemoglobin  10/20/2017 13.9  12.0 - 15.0 g/dL Final  . HCT 10/20/2017 43.8  36.0 - 46.0 % Final  . MCV 10/20/2017 94.0  78.0 - 100.0 fL Final  . MCH 10/20/2017 29.8  26.0 - 34.0 pg Final  . MCHC 10/20/2017 31.7  30.0 - 36.0 g/dL Final  . RDW 10/20/2017 15.2  11.5 - 15.5 % Final  . Platelets 10/20/2017 233  150 - 400 K/uL Final   Performed at [facility], [place] [address] [place], [place] [address]  . Sodium 10/20/2017 143  135 - 145 mmol/L Final  . Potassium 10/20/2017 4.4  3.5 - 5.1 mmol/L Final  . Chloride 10/20/2017 108  101 - 111 mmol/L Final  . CO2 10/20/2017 26  22 - 32 mmol/L Final  . Glucose, Bld 10/20/2017 113* 65 - 99 mg/dL Final  . BUN 10/20/2017 14  6 - 20 mg/dL Final  . Creatinine, Ser 10/20/2017 0.84  0.44 - 1.00 mg/dL Final  . Calcium 10/20/2017 9.8  8.9 - 10.3 mg/dL Final  . Total Protein 10/20/2017 7.6  6.5 - 8.1 g/dL Final  . Albumin 10/20/2017 4.3  3.5 - 5.0 g/dL Final  . AST 10/20/2017 21  15 - 41 U/L Final  . ALT 10/20/2017 11* 14 - 54 U/L Final  . Alkaline Phosphatase 10/20/2017 120  38 - 126 U/L Final  . Total Bilirubin 10/20/2017 0.4  0.3 - 1.2 mg/dL Final  . GFR calc non Af Amer 10/20/2017 >60  >60 mL/min Final  . GFR calc Af Amer 10/20/2017 >60  >60 mL/min Final   Comment: (NOTE) The eGFR has been calculated using the CKD EPI equation. This calculation has not been validated in all clinical situations. eGFR's persistently <60 mL/min signify possible Chronic Kidney Disease.   Georgiann Hahn gap 10/20/2017 9  5 - 15 Final   Performed at [facility], [place] [address], [place], [place] [address]  . Prothrombin Time 10/20/2017 12.3  11.4 - 15.2 seconds Final  . INR 10/20/2017 0.93   Final   Performed at [facility], [place] [address], [place], [place] [address]  . ABO/RH(D) 10/20/2017 O POS   Final  . Antibody Screen 10/20/2017 NEG   Final  . Sample Expiration 10/20/2017 10/29/2017   Final  . Extend sample reason 10/20/2017     Final                   Value:NO TRANSFUSIONS OR PREGNANCY IN THE PAST 3 MONTHS Performed at [facility], [place] [address], [place], [place] [address]   Appointment on 09/14/2017  Component Date Value Ref Range Status  . Hep B S Ab 09/14/2017 NON-REACTIVE  NON-REACTI Final  . Hepatitis C Ab 09/14/2017 NON-REACTIVE  NON-REACTI Final  .  SIGNAL TO CUT-OFF 09/14/2017 0.02  <1.00 Final  . Sed Rate 09/14/2017 39* 0 - 30 mm/hr Final  . CRP 09/14/2017 0.8  0.5 - 20.0 mg/dL Final  . QuantiFERON-TB Gold Plus 09/14/2017 NEGATIVE  NEGATIVE Final   Comment: Negative test result. M. tuberculosis complex  infection unlikely.   Marland Kitchen NIL 09/14/2017 0.02  IU/mL Final  . Mitogen-NIL 09/14/2017 >10.00  IU/mL Final  . TB1-NIL 09/14/2017 0.02  IU/mL Final  . TB2-NIL 09/14/2017 0.03  IU/mL Final   Comment: . The Nil tube value reflects the background interferon gamma immune response of the patient's blood sample. This value has been subtracted from the patient's displayed TB and Mitogen results. . Lower than expected results with the Mitogen tube prevent false-negative Quantiferon readings by detecting a patient with a potential immune suppressive condition and/or suboptimal pre-analytical specimen handling. . The TB1 Antigen tube is coated with the M. tuberculosis-specific antigens designed to elicit responses from TB antigen primed CD4+ helper T-lymphocytes. . The TB2 Antigen tube is coated with the M. tuberculosis-specific antigens designed to elicit responses from TB antigen primed CD4+ helper and CD8+ cytotoxic T-lymphocytes. . For additional information, please refer to http://education.questdiagnostics.com/faq/204 (This link is being provided for informational/ educational purposes only.) .   Appointment on 09/07/2017  Component Date Value Ref Range Status  . WBC Count 09/07/2017 4.4  3.9 - 10.3 K/uL Final  . RBC 09/07/2017 4.58  3.70 - 5.45 MIL/uL Final  .  Hemoglobin 09/07/2017 12.8  11.6 - 15.9 g/dL Final  . HCT 09/07/2017 41.3  34.8 - 46.6 % Final  . MCV 09/07/2017 90.2  79.5 - 101.0 fL Final  . MCH 09/07/2017 27.9  25.1 - 34.0 pg Final  . MCHC 09/07/2017 31.0* 31.5 - 36.0 g/dL Final  . RDW 09/07/2017 19.6* 11.2 - 14.5 % Final  . Platelet Count 09/07/2017 185  145 - 400 K/uL Final  . Neutrophils Relative % 09/07/2017 47  % Final  . Neutro Abs 09/07/2017 2.1  1.5 - 6.5 K/uL Final  . Lymphocytes Relative 09/07/2017 30  % Final  . Lymphs Abs 09/07/2017 1.3  0.9 - 3.3 K/uL Final  . Monocytes Relative 09/07/2017 8  % Final  . Monocytes Absolute 09/07/2017 0.4  0.1 - 0.9 K/uL Final  . Eosinophils Relative 09/07/2017 13  % Final  . Eosinophils Absolute 09/07/2017 0.6* 0.0 - 0.5 K/uL Final  . Basophils Relative 09/07/2017 2  % Final  . Basophils Absolute 09/07/2017 0.1  0.0 - 0.1 K/uL Final   Performed at [facility], [place] [address] [place], [place] [address]  . Iron 09/07/2017 86  41 - 142 ug/dL Final  . TIBC 09/07/2017 295  236 - 444 ug/dL Final  . Saturation Ratios 09/07/2017 29  21 - 57 % Final  . UIBC 09/07/2017 209  ug/dL Final   Performed at [facility], [place] [address], [place], [place] [address]  . Sodium 09/07/2017 141  136 - 145 mmol/L Final  . Potassium 09/07/2017 3.8  3.5 - 5.1 mmol/L Final  . Chloride 09/07/2017 106  98 - 109 mmol/L Final  . CO2 09/07/2017 26  22 - 29 mmol/L Final  . Glucose, Bld 09/07/2017 107  70 - 140 mg/dL Final  . BUN 09/07/2017 9  7 - 26 mg/dL Final  . Creatinine 09/07/2017 0.82  0.60 - 1.10 mg/dL Final  . Calcium 09/07/2017 9.4  8.4 - 10.4 mg/dL Final  . Total Protein 09/07/2017 6.9  6.4 - 8.3 g/dL Final  . Albumin 09/07/2017 3.8  3.5 - 5.0 g/dL Final  . AST 09/07/2017 12  5 - 34 U/L Final  . ALT 09/07/2017 7  0 - 55 U/L Final  . Alkaline Phosphatase 09/07/2017 135  40 - 150 U/L Final  . Total Bilirubin 09/07/2017 0.4  0.2 - 1.2 mg/dL Final  . GFR,  Est Non Af Am 09/07/2017 >60  >60 mL/min Final  . GFR, Est AFR Am 09/07/2017 >60  >60 mL/min Final   Comment: (NOTE) The eGFR has been calculated using the CKD EPI equation. This calculation has not been validated in all clinical situations. eGFR's persistently <60 mL/min signify possible Chronic Kidney Disease.   Georgiann Hahn gap 09/07/2017 9  3 - 11 Final   Performed at [facility], [place] [address], [place], [place] [address]  . Ferritin 09/07/2017 22  9 - 269 ng/mL Final   Performed at [facility], [place] [address] [place], [place] [address]     X-Rays:No results found.  EKG: Orders placed or performed during the hospital encounter of 07/09/17  . EKG 12-Lead  . EKG 12-Lead  . ED EKG  . ED EKG  . EKG     Hospital Course: Paytan RAMANDEEP ARINGTON is a 58 y.o. who was admitted to [facility]. They were brought to the operating room on 10/26/2017 and underwent Procedure(s): RIGHT TOTAL KNEE ARTHROPLASTY.  Patient tolerated the procedure well and was later transferred to the recovery room and then to the orthopaedic floor for postoperative care.  They were given PO and IV analgesics for pain control following their surgery.  They were given 24 hours of postoperative antibiotics of  Anti-infectives (From admission, onward)   Start     Dose/Rate Route Frequency Ordered Stop   10/27/17 0600  vancomycin (VANCOCIN) IVPB 1000 mg/200 mL premix     1,000 mg 200 mL/hr over 60 Minutes Intravenous  Once 10/26/17 2311 10/27/17 0757   10/26/17 0705  vancomycin (VANCOCIN) IVPB 1000 mg/200 mL premix     1,000 mg 200 mL/hr over 60 Minutes Intravenous On call to O.R. 10/26/17 1517 10/26/17 0951     and started on DVT prophylaxis in the form of Xarelto.   PT and OT were ordered for total joint protocol.  Discharge planning consulted to help with postop disposition and equipment needs.  Patient had a good night on the evening of surgery and walked about 50  feet that evening following the procedure.  They started to get up OOB with therapy on day one. Hemovac drain was pulled without difficulty.  Patient was seen in rounds on POD 1 and setup for discharge.  Received therapy and was ready to go home later that day.   Diet - Regular diet Follow up - in 2 weeks Activity - WBAT Disposition - Home Condition Upon Discharge - stable D/C Meds - See DC Summary DVT Prophylaxis - Xarelto     Discharge Instructions    Call MD / Call 911   Complete by:  As directed    If you experience chest pain or shortness of breath, CALL 911 and be transported to the hospital emergency room.  If you develope a fever above 101 F, pus (white drainage) or increased drainage or redness at the wound, or calf pain, call your surgeon's office.   Change dressing   Complete by:  As directed    Change dressing daily with sterile  4 x 4 inch gauze dressing and apply TED hose. Do not submerge the incision under water.   Constipation Prevention   Complete by:  As directed    Drink plenty of fluids.  Prune juice may be helpful.  You may use a stool softener, such as Colace (over the counter) 100 mg twice a day.  Use MiraLax (over the counter) for constipation as needed.   Diet general   Complete by:  As directed    Discharge instructions   Complete by:  As directed    Take Xarelto for two and a half more weeks, then discontinue Xarelto.  Pick up stool softner and laxative for home use following surgery while on pain medications. Do not submerge incision under water. Please use good hand washing techniques while changing dressing each day. May shower starting three days after surgery. Please use a clean towel to pat the incision dry following showers. Continue to use ice for pain and swelling after surgery. Do not use any lotions or creams on the incision until instructed by your surgeon.  Wear both TED hose on both legs during the day every day for three weeks, but may  remove the TED hose at night at home.  Postoperative Constipation Protocol  Constipation - defined medically as fewer than three stools per week and severe constipation as less than one stool per week.  One of the most common issues patients have following surgery is constipation.  Even if you have a regular bowel pattern at home, your normal regimen is likely to be disrupted due to multiple reasons following surgery.  Combination of anesthesia, postoperative narcotics, change in appetite and fluid intake all can affect your bowels.  In order to avoid complications following surgery, here are some recommendations in order to help you during your recovery period.  Colace (docusate) - Pick up an over-the-counter form of Colace or another stool softener and take twice a day as long as you are requiring postoperative pain medications.  Take with a full glass of water daily.  If you experience loose stools or diarrhea, hold the colace until you stool forms back up.  If your symptoms do not get better within 1 week or if they get worse, check with your doctor.  Dulcolax (bisacodyl) - Pick up over-the-counter and take as directed by the product packaging as needed to assist with the movement of your bowels.  Take with a full glass of water.  Use this product as needed if not relieved by Colace only.   MiraLax (polyethylene glycol) - Pick up over-the-counter to have on hand.  MiraLax is a solution that will increase the amount of water in your bowels to assist with bowel movements.  Take as directed and can mix with a glass of water, juice, soda, coffee, or tea.  Take if you go more than two days without a movement. Do not use MiraLax more than once per day. Call your doctor if you are still constipated or irregular after using this medication for 7 days in a row.  If you continue to have problems with postoperative constipation, please contact the office for further assistance and recommendations.  If you  experience "the worst abdominal pain ever" or develop nausea or vomiting, please contact the office immediatly for further recommendations for treatment.   Do not put a pillow under the knee. Place it under the heel.   Complete by:  As directed    Do not sit on low chairs, stoools  or toilet seats, as it may be difficult to get up from low surfaces   Complete by:  As directed    Driving restrictions   Complete by:  As directed    No driving until released by the physician.   Increase activity slowly as tolerated   Complete by:  As directed    Lifting restrictions   Complete by:  As directed    No lifting until released by the physician.   Patient may shower   Complete by:  As directed    You may shower without a dressing once there is no drainage.  Do not wash over the wound.  If drainage remains, do not shower until drainage stops.   TED hose   Complete by:  As directed    Use stockings (TED hose) for 3 weeks on both leg(s).  You may remove them at night for sleeping.   Weight bearing as tolerated   Complete by:  As directed    Laterality:  right   Extremity:  Lower     Allergies as of 10/27/2017      Reactions   Acetaminophen Other (See Comments)   Very sleepy   Penicillins Hives, Other (See Comments)   FEB 2018 - broke out in hives Has patient had a PCN reaction causing immediate rash, facial/tongue/throat swelling, SOB or lightheadedness with hypotension: Yes Has patient had a PCN reaction causing severe rash involving mucus membranes or skin necrosis: No Has patient had a PCN reaction that required hospitalization: No Has patient had a PCN reaction occurring within the last 10 years: Yes If all of the above answers are "NO", then may proceed with Cephalosporin use.   Topamax [topiramate] Other (See Comments)   Kidney stones      Medication List    STOP taking these medications   ICY HOT EX   NORCO 10-325 MG tablet Generic drug:  HYDROcodone-acetaminophen   WOMENS  50+ MULTI VITAMIN/MIN PO     TAKE these medications   ferrous sulfate 325 (65 FE) MG tablet Take 325 mg by mouth daily with breakfast.   gabapentin 400 MG capsule Commonly known as:  NEURONTIN Take 400-800 mg by mouth at bedtime as needed (for pain).   ipratropium-albuterol 0.5-2.5 (3) MG/3ML Soln Commonly known as:  DUONEB Take 3 mLs by nebulization every 6 (six) hours as needed.   LASIX 20 MG tablet Generic drug:  furosemide Take 20 mg by mouth daily as needed for fluid.   Melatonin 1 MG Caps Take 1-2 mg by mouth at bedtime as needed (for sleep).   mesalamine 0.375 g 24 hr capsule Commonly known as:  APRISO Take 4 capsules (1.5 g total) by mouth daily. Take for Ulcerative Colitis What changed:    how much to take  when to take this  additional instructions   methocarbamol 500 MG tablet Commonly known as:  ROBAXIN Take 1 tablet (500 mg total) by mouth every 6 (six) hours as needed for muscle spasms.   NEXIUM 20 MG capsule Generic drug:  esomeprazole Take 20 mg by mouth daily.   Oxycodone HCl 10 MG Tabs Take 1-1.5 tablets (10-15 mg total) by mouth every 4 (four) hours as needed for moderate pain or severe pain. What changed:    medication strength  how much to take   rivaroxaban 10 MG Tabs tablet Commonly known as:  XARELTO Take 1 tablet (10 mg total) by mouth daily with breakfast. Take Xarelto for two and a half more  weeks following discharge from the hospital, then discontinue Xarelto. Once the patient has completed the blood thinner regimen, then take a Baby 81 mg Aspirin daily for three more weeks. Start taking on:  10/28/2017   traMADol 50 MG tablet Commonly known as:  ULTRAM Take 1-2 tablets (50-100 mg total) by mouth every 6 (six) hours as needed (mild pain). What changed:  reasons to take this            Discharge Care Instructions  (From admission, onward)        Start     Ordered   10/27/17 0000  Weight bearing as tolerated    Question  Answer Comment  Laterality right   Extremity Lower      10/27/17 0913   10/27/17 0000  Change dressing    Comments:  Change dressing daily with sterile 4 x 4 inch gauze dressing and apply TED hose. Do not submerge the incision under water.   10/27/17 0913     Follow-up Information    Gaynelle Arabian, MD. Schedule an appointment as soon as possible for a visit on 11/10/2017.   Specialty:  Orthopedic Surgery Contact information: [address] [place] [address] [phone]           Signed: Arlee Muslim, PA-C Orthopaedic Surgery 10/27/2017, 9:14 AM

## 2017-10-27 NOTE — Progress Notes (Signed)
Spoke with patient at bedside. Confirmed plan for OP PT, already arranged. Has RW and 3n1. [phone] 

## 2017-10-27 NOTE — Care Management Obs Status (Signed)
MEDICARE OBSERVATION STATUS NOTIFICATION   Patient Details  Name: [name] [name] MRN: [id number] Date of Birth: 1961-05-10   Medicare Observation Status Notification Given:  Yes    Alexis Goodelleele,  K, RN 10/27/2017, 11:05 AM

## 2017-10-27 NOTE — Progress Notes (Signed)
Physical Therapy Treatment Patient Details Name: [name] [name] MRN: [id number] DOB: 1961/04/27 Today's Date: 10/27/2017    History of Present Illness R TKA, H/O LTKA    PT Comments    POD # 1 am session Assisted with amb a greater distance in hallway then returned to room to perform TKR TE's following handout HEP.  Instructed on proper tech, freq as well as use of ICE.   Follow Up Recommendations  Follow surgeon's recommendation for DC plan and follow-up therapies     Equipment Recommendations  None recommended by PT    Recommendations for Other Services       Precautions / Restrictions Precautions Precautions: Knee    Mobility  Bed Mobility               General bed mobility comments: OOB in recliner  Transfers Overall transfer level: Needs assistance Equipment used: Rolling walker (2 wheeled) Transfers: Sit to/from Stand Sit to Stand: Min assist         General transfer comment: cues for safety and technique, patient is impulsive  Ambulation/Gait Ambulation/Gait assistance: Min assist Ambulation Distance (Feet): 75 Feet Assistive device: Rolling walker (2 wheeled) Gait Pattern/deviations: Step-to pattern;Antalgic Gait velocity: WFL   General Gait Details: cues for sequence and safety   Stairs            Wheelchair Mobility    Modified Rankin (Stroke Patients Only)       Balance                                            Cognition Arousal/Alertness: Awake/alert Behavior During Therapy: WFL for tasks assessed/performed Overall Cognitive Status: Within Functional Limits for tasks assessed                                        Exercises   Total Knee Replacement TE's 10 reps B LE ankle pumps 10 reps towel squeezes 10 reps knee presses 10 reps heel slides  10 reps SAQ's 10 reps SLR's 10 reps ABD Followed by ICE    General Comments        Pertinent Vitals/Pain Pain Assessment:  0-10 Pain Score: 5  Pain Location: right knee Pain Descriptors / Indicators: Discomfort;Dull Pain Intervention(s): Monitored during session;Repositioned;Premedicated before session    Home Living                      Prior Function            PT Goals (current goals can now be found in the care plan section) Progress towards PT goals: Progressing toward goals    Frequency           PT Plan Current plan remains appropriate    Co-evaluation              AM-PAC PT "6 Clicks" Daily Activity  Outcome Measure  Difficulty turning over in bed (including adjusting bedclothes, sheets and blankets)?: A Little Difficulty moving from lying on back to sitting on the side of the bed? : A Little   Help needed moving to and from a bed to chair (including a wheelchair)?: A Lot Help needed walking in hospital room?: A Lot Help needed climbing 3-5 steps with a railing? : Total 6 Click  Score: 11    End of Session Equipment Utilized During Treatment: Gait belt Activity Tolerance: Patient tolerated treatment well Patient left: in chair;with call bell/phone within reach Nurse Communication: Mobility status PT Visit Diagnosis: Unsteadiness on feet (R26.81) Pain - Right/Left: Right Pain - part of body: Knee     Time: 1030-1059 PT Time Calculation (min) (ACUTE ONLY): 29 min  Charges:  $Gait Training: 8-22 mins $Therapeutic Exercise: 8-22 mins                    G Codes:       Felecia ShellingLori   PTA [facility]  Acute  Rehab Pager      [phone]

## 2017-10-27 NOTE — Progress Notes (Signed)
Physical Therapy Treatment Patient Details Name: [name] [name] MRN: [id number] DOB: [birth year] Today's Date: 10/27/2017    History of Present Illness R TKA, H/O LTKA    PT Comments    POD # 1 pm session Assisted with amb a greater distance, assisted with stair training then back to bed for CPM.  Pt plans to D/C to home today about 5 pm.   Follow Up Recommendations  Follow surgeon's recommendation for DC plan and follow-up therapies     Equipment Recommendations  None recommended by PT    Recommendations for Other Services       Precautions / Restrictions Precautions Precautions: Knee    Mobility  Bed Mobility               General bed mobility comments: assisted back to bed   Transfers Overall transfer level: Needs assistance Equipment used: Rolling walker (2 wheeled) Transfers: Sit to/from Stand Sit to Stand: Min assist         General transfer comment: cues for safety and technique, patient is impulsive  Ambulation/Gait Ambulation/Gait assistance: Min assist Ambulation Distance (Feet): 83 Feet Assistive device: Rolling walker (2 wheeled) Gait Pattern/deviations: Step-to pattern;Antalgic Gait velocity: WFL   General Gait Details: cues for sequence and safety   Stairs    MinGuard Assist B rails and 25% VC's on proper sequencing.  Performed twice.         Wheelchair Mobility    Modified Rankin (Stroke Patients Only)       Balance                                            Cognition Arousal/Alertness: Awake/alert Behavior During Therapy: WFL for tasks assessed/performed Overall Cognitive Status: Within Functional Limits for tasks assessed                                        Exercises      General Comments        Pertinent Vitals/Pain Pain Assessment: 0-10 Pain Score: 5  Pain Location: right knee Pain Descriptors / Indicators: Discomfort;Dull Pain Intervention(s): Monitored during  session;Repositioned;Premedicated before session    Home Living                      Prior Function            PT Goals (current goals can now be found in the care plan section) Progress towards PT goals: Progressing toward goals    Frequency           PT Plan Current plan remains appropriate    Co-evaluation              AM-PAC PT "6 Clicks" Daily Activity  Outcome Measure  Difficulty turning over in bed (including adjusting bedclothes, sheets and blankets)?: A Little Difficulty moving from lying on back to sitting on the side of the bed? : A Little   Help needed moving to and from a bed to chair (including a wheelchair)?: A Lot Help needed walking in hospital room?: A Lot Help needed climbing 3-5 steps with a railing? : Total 6 Click Score: 11    End of Session Equipment Utilized During Treatment: Gait belt Activity Tolerance: Patient tolerated treatment well Patient left: in chair;with  call bell/phone within reach Nurse Communication: Mobility status PT Visit Diagnosis: Unsteadiness on feet (R26.81) Pain - Right/Left: Right Pain - part of body: Knee     Time: 1350-1413 PT Time Calculation (min) (ACUTE ONLY): 23 min  Charges:  $Gait Training: 8-22 mins $Therapeutic Activity: 8-22 mins                    G Codes:       Felecia Shelling  PTA [facility]  Acute  Rehab Pager      [phone]

## 2017-10-30 ENCOUNTER — Ambulatory Visit: Payer: 59 | Attending: Orthopedic Surgery | Admitting: Physical Therapy

## 2017-10-30 ENCOUNTER — Encounter: Payer: Self-pay | Admitting: Physical Therapy

## 2017-10-30 DIAGNOSIS — R600 Localized edema: Secondary | ICD-10-CM | POA: Insufficient documentation

## 2017-10-30 DIAGNOSIS — G8929 Other chronic pain: Secondary | ICD-10-CM | POA: Diagnosis present

## 2017-10-30 DIAGNOSIS — M25661 Stiffness of right knee, not elsewhere classified: Secondary | ICD-10-CM | POA: Insufficient documentation

## 2017-10-30 DIAGNOSIS — M25561 Pain in right knee: Secondary | ICD-10-CM | POA: Insufficient documentation

## 2017-10-30 NOTE — Therapy (Signed)
[facility] [address] [place], [place], [address] Phone: [phone]   Fax:  [phone]  Physical Therapy Evaluation  Patient Details  Name: [name] [name] MRN: [id number] Date of Birth: 11/07/1960 Referring Provider: Ollen Gross MD.   Encounter Date: 10/30/2017  PT End of Session - 10/30/17 1125    Visit Number  1    Number of Visits  12    Date for PT Re-Evaluation  12/11/17    PT Start Time  1030    PT Stop Time  1115    PT Time Calculation (min)  45 min    Activity Tolerance  Patient tolerated treatment well    Behavior During Therapy  [facility] for tasks assessed/performed       Past Medical History:  Diagnosis Date  . Anemia 07/2017   on meds  . Arthritis   . Black-out (not amnesia) 06/2017   black out spells due to anemia!  Marland Kitchen Blood transfusion without reported diagnosis    Pt thinks she had 3 units blood in Dec, 2018  . GERD (gastroesophageal reflux disease)   . History of kidney stones   . Kidney stones   . Migraine   . Restless leg syndrome   . Swelling of lower limb    bialteral lower leg swelling    Past Surgical History:  Procedure Laterality Date  . MULTIPLE TOOTH EXTRACTIONS    . right eye lid surgery     . TOTAL KNEE ARTHROPLASTY Left 01/26/2017   Procedure: LEFT TOTAL KNEE ARTHROPLASTY;  Surgeon: Ollen Gross, MD;  Location: [facility];  Service: Orthopedics;  Laterality: Left;  with block  . TOTAL KNEE ARTHROPLASTY Right 10/26/2017   Procedure: RIGHT TOTAL KNEE ARTHROPLASTY;  Surgeon: Ollen Gross, MD;  Location: [facility];  Service: Orthopedics;  Laterality: Right;  . TUBAL LIGATION      There were no vitals filed for this visit.   Subjective Assessment - 10/30/17 1128    Subjective  Patient arrives to physical therapy with daughter status post right total knee arthroplasty 10/26/2017. Patient stated she received physical therapy in the hospital however she did not receive home therapy as expected.  Patient states she performs HEP provided by hospital PT, walks in home with walker, and negotiates 5 steps with bilateral railings with daughter daily. She reports pain is constant and pain medication helps. She reports pain in right lateral thigh and pain with movement. Patient's goals is to decrease pain, improve movement, and return to truck driving.    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  Yes    Pain Score  8     Pain Location  Knee    Pain Orientation  Right    Pain Descriptors / Indicators  Aching;Sore    Pain Type  Surgical pain    Pain Onset  In the past 7 days    Pain Frequency  Constant    Aggravating Factors   Movement    Pain Relieving Factors  "nothing" medication         [facility] PT Assessment - 10/30/17 0001      Assessment   Medical Diagnosis  Right total knee replacement    Onset Date/Surgical Date  10/26/17    Next MD  Visit  November 10, 2017    Prior Therapy  yes      Restrictions   Weight Bearing Restrictions  No      Balance Screen   Has the patient fallen in the past 6 months  No    Has the patient had a decrease in activity level because of a fear of falling?   No    Is the patient reluctant to leave their home because of a fear of falling?   No      Home Public house manager residence    Home Access  Stairs to enter    Entrance Stairs-Number of Steps  5    Entrance Stairs-Rails  Can reach both      Prior Function   Level of Independence  Independent      Observation/Other Assessments   Skin Integrity  Noted with ecchymosis, incision glued with steristrips    Focus on Therapeutic Outcomes (FOTO)   76% limited      Sensation   Light Touch  Impaired by gross assessment    Additional Comments  decreased senstation along medial knee       ROM / Strength   AROM / PROM / Strength  AROM;PROM;Strength      AROM   Overall AROM   Deficits;Due to pain    AROM Assessment Site  Knee    Right/Left Knee  Right    Right Knee Extension  -13    Right Knee Flexion  60      PROM   Overall PROM   Deficits;Due to pain    PROM Assessment Site  Knee    Right/Left Knee  Right    Right Knee Extension  -13    Right Knee Flexion  70      Strength   Overall Strength  Deficits    Overall Strength Comments  grossly assessed 3/5      Bed Mobility   Bed Mobility  Supine to Sit    Supine to Sit  4: Min assist    Supine to Sit Details (indicate cue type and reason)  Supine<>Sit: required PT assistance to lift leg onto and off plinth, with cuing to try to help as much as possible      Ambulation/Gait   Assistive device  Rolling walker    Gait Pattern  Step-to pattern;Decreased step length - left;Decreased step length - right;Decreased stance time - right;Decreased weight shift to right;Decreased stride length;Decreased hip/knee flexion - right;Right flexed knee in stance;Antalgic increased UE support on Rolling walker      Balance   Balance Assessed  Yes      Standardized Balance Assessment   Standardized Balance Assessment  -- (+) romberg NBOS 3seconds              No data recorded  Objective measurements completed on examination: See above findings.              PT Education - 10/30/17 1126    Education provided  Yes    Education Details  HEP, Quad sets, seated heel prop with overpressure, walk with walker, ambulate steps with assistance    Person(s) Educated  Patient;Child(ren)    Methods  Demonstration;Explanation;Tactile cues;Verbal cues    Comprehension  Verbalized understanding       PT Short Term Goals - 10/30/17 1622      PT SHORT TERM GOAL #1   Title  Independent with an initial HEP.  Time  2    Period  Weeks    Status  New        PT Long Term Goals - 10/30/17 1623      PT LONG TERM GOAL  #1   Title  Patient will improve left knee AROM to 0-120 degrees + to normalize gait pattern and to perform functional activities.    Time  4    Period  Weeks    Status  New      PT LONG TERM GOAL #2   Title  Patient will improve L knee MMT to 5/5 for stability during functional activities.    Time  4    Period  Weeks    Status  New      PT LONG TERM GOAL #3   Title  Patient will negotiate steps with step over step pattern with bilateral railings to safely enter and exit home.    Time  4    Period  Weeks    Status  New      PT LONG TERM GOAL #4   Title  Patient will demonstrate less than 2/10 pain with functional activities and ADLs.    Time  4    Period  Weeks    Status  New             Plan - 10/30/17 1618    Clinical Impression Statement  Patient is a 57 year old female who presents to physical therapy s/p right total knee arthroplasty 10/26/2017. Patient ambulates with rolling walker with step-to, antalgic gait pattern, decreased knee flexion during swing phase, decreased stance time and increased weightbearing on R LE. Patient (+) Romberg, unable to stand without support for greater than 3 seconds in narrow base of support standing. Patient's knee wrapped with ace bandage with gauze and surgical dressings on top of incision.  There is noted drainage at lateral knee with bruising due to tourniquet. Patient noted with decreased sensation to medial knee. Patient has poor quadriceps activation; patient stated she had a nerve block. Patient's strength, AROM and PROM limited secondary to pain. Patient would benefit from skilled physical therapy to address deficits and to address patient goals.     Clinical Presentation  Stable    Clinical Decision Making  Low    Rehab Potential  Excellent    PT Frequency  3x / week    PT Duration  4 weeks    PT Treatment/Interventions  ADLs/Self Care Home Management;Cryotherapy;Press photographer;Therapeutic activities;Therapeutic exercise;Balance training;Patient/family education;Neuromuscular re-education;Manual techniques;Dry needling;Passive range of motion;Taping;Vasopneumatic Device;Scar mobilization    PT Next Visit Plan  Assess compliance with HEP, TKA protocol, modalities PRN for pain relief    PT Home Exercise Plan  quad sets, seated heel prop for extension    Consulted and Agree with Plan of Care  Patient;Family member/caregiver    Family Member Consulted  Daughter, Gigi Gin.       Patient will benefit from skilled therapeutic intervention in order to improve the following deficits and impairments:  Abnormal gait, Decreased activity tolerance, Decreased range of motion, Decreased strength, Increased edema, Pain, Decreased balance  Visit Diagnosis: Stiffness of right knee, not elsewhere classified  Chronic pain of right knee  Localized edema     Problem List Patient Active Problem List   Diagnosis Date Noted  . Iron deficiency anemia due to chronic blood loss   . Symptomatic anemia 07/09/2017  . SIRS (systemic inflammatory response syndrome) (HCC) 07/09/2017  .  Hypokalemia 07/09/2017  . Bronchitis 07/09/2017  . Arthritis 07/09/2017  . URI (upper respiratory infection) 07/09/2017  . OA (osteoarthritis) of knee 01/26/2017  . Nephrolithiasis 11/15/2013  . Chest pain 10/22/2012  . Migraine 10/22/2012  . Numbness and tingling in hands 10/22/2012   Guss BundeKrystle , PT, DPT [facility] [address] [place], [place], [address] Phone: [phone]   Fax:  [phone]  Name: [name] [name] MRN: [id number] Date of Birth: 21-Jun-1961

## 2017-11-02 ENCOUNTER — Encounter: Payer: Self-pay | Admitting: Physical Therapy

## 2017-11-02 ENCOUNTER — Ambulatory Visit: Payer: 59 | Attending: Orthopedic Surgery | Admitting: Physical Therapy

## 2017-11-02 DIAGNOSIS — R600 Localized edema: Secondary | ICD-10-CM | POA: Insufficient documentation

## 2017-11-02 DIAGNOSIS — G8929 Other chronic pain: Secondary | ICD-10-CM | POA: Diagnosis present

## 2017-11-02 DIAGNOSIS — M25661 Stiffness of right knee, not elsewhere classified: Secondary | ICD-10-CM | POA: Insufficient documentation

## 2017-11-02 DIAGNOSIS — M25562 Pain in left knee: Secondary | ICD-10-CM | POA: Insufficient documentation

## 2017-11-02 DIAGNOSIS — M25561 Pain in right knee: Secondary | ICD-10-CM | POA: Diagnosis present

## 2017-11-02 DIAGNOSIS — M25662 Stiffness of left knee, not elsewhere classified: Secondary | ICD-10-CM | POA: Diagnosis present

## 2017-11-02 NOTE — Therapy (Signed)
[facility] [address] [place], [place], [address] Phone: [phone]   Fax:  [phone]  Physical Therapy Treatment  Patient Details  Name: [name] [name] MRN: [id number] Date of Birth: [birth year] Referring Provider: Ollen Gross MD.   Encounter Date: 11/02/2017  PT End of Session - 11/02/17 1540    Visit Number  2    Number of Visits  12    Date for PT Re-Evaluation  12/11/17    PT Start Time  1512    PT Stop Time  1556    PT Time Calculation (min)  44 min    Activity Tolerance  Patient tolerated treatment well    Behavior During Therapy  [facility] for tasks assessed/performed       Past Medical History:  Diagnosis Date  . Anemia 07/2017   on meds  . Arthritis   . Black-out (not amnesia) 06/2017   black out spells due to anemia!  Marland Kitchen Blood transfusion without reported diagnosis    Pt thinks she had 3 units blood in Dec, 2018  . GERD (gastroesophageal reflux disease)   . History of kidney stones   . Kidney stones   . Migraine   . Restless leg syndrome   . Swelling of lower limb    bialteral lower leg swelling    Past Surgical History:  Procedure Laterality Date  . MULTIPLE TOOTH EXTRACTIONS    . right eye lid surgery     . TOTAL KNEE ARTHROPLASTY Left 01/26/2017   Procedure: LEFT TOTAL KNEE ARTHROPLASTY;  Surgeon: Ollen Gross, MD;  Location: [facility];  Service: Orthopedics;  Laterality: Left;  with block  . TOTAL KNEE ARTHROPLASTY Right 10/26/2017   Procedure: RIGHT TOTAL KNEE ARTHROPLASTY;  Surgeon: Ollen Gross, MD;  Location: [facility];  Service: Orthopedics;  Laterality: Right;  . TUBAL LIGATION      There were no vitals filed for this visit.  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  Yes    Pain Score  8     Pain Location  Knee    Pain Orientation  Right    Pain Descriptors / Indicators  Discomfort    Pain Type  Surgical pain    Pain Onset  1 to 4 weeks ago    Pain Frequency  Constant    Aggravating Factors   movement    Pain Relieving Factors  rest         [facility] PT Assessment - 11/02/17 0001      PROM   PROM Assessment Site  Knee    Right/Left Knee  Right    Right Knee Flexion  80                   [facility] Adult PT Treatment/Exercise - 11/02/17 0001      Exercises   Exercises  Knee/Hip      Knee/Hip Exercises: Seated   Long Arc Quad  Strengthening;Right;20 reps      Knee/Hip Exercises: Supine   Heel Slides  Right;AROM;20 reps      Vasopneumatic   Number Minutes Vasopneumatic   15 minutes  Vasopnuematic Location   Knee    Vasopneumatic Pressure  Low      Manual Therapy   Manual Therapy  Passive ROM    Passive ROM  manual PROM for right knee flexion and ext with gentle holds both in sitting then supine             PT Education - 11/02/17 1546    Education provided  Yes    Education Details  HEP    Person(s) Educated  Patient;Child(ren)    Methods  Explanation;Demonstration;Handout    Comprehension  Verbalized understanding;Returned demonstration       PT Short Term Goals - 11/02/17 1543      PT SHORT TERM GOAL #1   Title  Independent with an initial HEP.    Time  2    Period  Weeks    Status  On-going        PT Long Term Goals - 11/02/17 1543      PT LONG TERM GOAL #1   Title  Patient will improve left knee AROM to 0-120 degrees + to normalize gait pattern and to perform functional activities.    Time  4    Period  Weeks    Status  On-going      PT LONG TERM GOAL #2   Title  Patient will improve L knee MMT to 5/5 for stability during functional activities.    Time  4    Status  On-going      PT LONG TERM GOAL #3   Title  Patient will  negotiate steps with step over step pattern with bilateral railings to safely enter and exit home.    Time  4    Period  Weeks    Status  On-going      PT LONG TERM GOAL #4   Title  Patient will demonstrate less than 2/10 pain with functional activities and ADLs.    Time  4    Period  Weeks    Status  On-going            Plan - 11/02/17 1541    Clinical Impression Statement  Patient tolerated treatment fair today. Patient limited with pain and weakness in right LE. Patient did not want to do nustep at this time. Today focused on ROM and quad activation exercise today. Educated patient and daughter on HEP and protocol/progression. Patient reported doing some exercises at home and will continue with ROM exercises for right knee. Current goals ongoing at this time.     Rehab Potential  Excellent    PT Frequency  3x / week    PT Duration  4 weeks    PT Treatment/Interventions  ADLs/Self Care Home Management;Cryotherapy;Publishing copy;Therapeutic activities;Therapeutic exercise;Balance training;Patient/family education;Neuromuscular re-education;Manual techniques;Dry needling;Passive range of motion;Taping;Vasopneumatic Device;Scar mobilization    PT Next Visit Plan  cont with TKA protocol, modalities PRN for pain relief    Consulted and Agree with Plan of Care  Patient       Patient will benefit from skilled therapeutic intervention in order to improve the following deficits and impairments:  Abnormal gait, Decreased activity tolerance, Decreased range of motion, Decreased strength, Increased edema, Pain, Decreased balance  Visit Diagnosis: Stiffness of right knee, not elsewhere classified  Chronic pain of right knee  Localized edema     Problem List Patient Active Problem List   Diagnosis Date Noted  . Iron deficiency anemia due to chronic blood loss   .  Symptomatic anemia 07/09/2017  . SIRS (systemic inflammatory response  syndrome) (HCC) 07/09/2017  . Hypokalemia 07/09/2017  . Bronchitis 07/09/2017  . Arthritis 07/09/2017  . URI (upper respiratory infection) 07/09/2017  . OA (osteoarthritis) of knee 01/26/2017  . Nephrolithiasis 11/15/2013  . Chest pain 10/22/2012  . Migraine 10/22/2012  . Numbness and tingling in hands 10/22/2012    ,  P, PTA 11/02/2017, 3:56 PM  [facility] [address] [place], [place], [address] Phone: [phone]   Fax:  [phone]  Name: [name] [name] MRN: [id number] Date of Birth: 01-29-1961

## 2017-11-02 NOTE — Patient Instructions (Signed)
Chair Knee Flexion   Keeping feet on floor, slide foot of operated leg back, bending knee. Hold ___30_ seconds. Repeat __5__ times. Do __2-4__ sessions a day.  Heel Slide   Bend left knee and pull heel toward buttocks. Use strap around foot and pull strap with arms to assist knee to bend further. Hold 10 secs.  Repeat ____ times. Do ____ sessions per day.      Knee Extension Mobilization: Towel Prop   With rolled towel under right ankle, place _1-5___ pound weight across knee. Hold __5+__ minutes. Repeat __2-3__ times per set. Do __2__ sets per session. Do __2-4__ sessions per day.  Sitting knee extension stretch    Place one foot on table. Straighten leg and attempt to keep it straight, then push down until feel a stretch. Hold _30__ seconds. Repeat __5-10_ times each leg, alternating. Do _2-4__ sessions per day.      Knee Flexion Stretch on Step  Place foot on step and lean forward until you feel a good stretch in front of knee.   hold 30 sec x 5-10 perform 2-4 x daily

## 2017-11-04 ENCOUNTER — Ambulatory Visit: Payer: 59 | Admitting: Physical Therapy

## 2017-11-04 ENCOUNTER — Encounter: Payer: Self-pay | Admitting: Physical Therapy

## 2017-11-04 DIAGNOSIS — M25661 Stiffness of right knee, not elsewhere classified: Secondary | ICD-10-CM | POA: Diagnosis not present

## 2017-11-04 DIAGNOSIS — M25561 Pain in right knee: Secondary | ICD-10-CM

## 2017-11-04 DIAGNOSIS — G8929 Other chronic pain: Secondary | ICD-10-CM

## 2017-11-04 DIAGNOSIS — R600 Localized edema: Secondary | ICD-10-CM

## 2017-11-04 NOTE — Therapy (Signed)
[facility] [address] [place], [place], [address] Phone: [phone]   Fax:  [phone]  Physical Therapy Treatment  Patient Details  Name: [name] [name] MRN: [id number] Date of Birth: 02/10/1961 Referring Provider: Ollen Gross MD.   Encounter Date: 11/04/2017  PT End of Session - 11/04/17 1516    Visit Number  3    Number of Visits  12    Date for PT Re-Evaluation  12/11/17    PT Start Time  1450    PT Stop Time  1530    PT Time Calculation (min)  40 min    Activity Tolerance  Patient tolerated treatment well    Behavior During Therapy  [facility] for tasks assessed/performed       Past Medical History:  Diagnosis Date  . Anemia 07/2017   on meds  . Arthritis   . Black-out (not amnesia) 06/2017   black out spells due to anemia!  Marland Kitchen Blood transfusion without reported diagnosis    Pt thinks she had 3 units blood in Dec, 2018  . GERD (gastroesophageal reflux disease)   . History of kidney stones   . Kidney stones   . Migraine   . Restless leg syndrome   . Swelling of lower limb    bialteral lower leg swelling    Past Surgical History:  Procedure Laterality Date  . MULTIPLE TOOTH EXTRACTIONS    . right eye lid surgery     . TOTAL KNEE ARTHROPLASTY Left 01/26/2017   Procedure: LEFT TOTAL KNEE ARTHROPLASTY;  Surgeon: Ollen Gross, MD;  Location: [facility];  Service: Orthopedics;  Laterality: Left;  with block  . TOTAL KNEE ARTHROPLASTY Right 10/26/2017   Procedure: RIGHT TOTAL KNEE ARTHROPLASTY;  Surgeon: Ollen Gross, MD;  Location: [facility];  Service: Orthopedics;  Laterality: Right;  . TUBAL LIGATION      There were no vitals filed for this visit.  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  Yes    Pain Score  4     Pain Location  Knee    Pain Orientation  Right    Pain Descriptors / Indicators  Discomfort;Sore;Aching    Pain Type  Surgical pain    Pain Onset  1 to 4 weeks ago    Pain Frequency  Constant    Aggravating Factors   activity or movement    Pain Relieving Factors  rest         [facility] PT Assessment - 11/04/17 0001      AROM   AROM Assessment Site  Knee    Right/Left Knee  Right    Right Knee Extension  -13    Right Knee Flexion  86      PROM   PROM Assessment Site  Knee    Right/Left Knee  Right    Right Knee Extension  -8    Right Knee Flexion  97                   [facility] Adult PT Treatment/Exercise - 11/04/17 0001      Knee/Hip Exercises: Seated   Long Arc [employer]  Strengthening;Right;AROM;10 reps      Knee/Hip Exercises: Supine   Short Arc Quad Sets  Strengthening;Right;10 reps;2 sets      Modalities   Modalities  Programmer, applications Location  right knee    Statistician Action  1-10hz  x67min    Statistician Parameters  IFC    Electrical Stimulation Goals  Edema;Pain      Vasopneumatic   Number Minutes Vasopneumatic   10 minutes    Vasopnuematic Location   Knee    Vasopneumatic Pressure  Low      Manual Therapy   Manual Therapy  Passive ROM    Passive ROM  manual PROM for right knee flexion and ext with gentle holds both in supine               PT Short Term Goals - 11/02/17 1543      PT SHORT TERM GOAL #1   Title  Independent with an initial HEP.    Time  2    Period  Weeks    Status  On-going        PT Long Term Goals - 11/02/17 1543      PT LONG TERM GOAL #1   Title  Patient will improve left knee AROM to 0-120 degrees + to normalize gait pattern and to perform functional activities.    Time  4    Period  Weeks    Status  On-going      PT LONG TERM  GOAL #2   Title  Patient will improve L knee MMT to 5/5 for stability during functional activities.    Time  4    Status  On-going      PT LONG TERM GOAL #3   Title  Patient will negotiate steps with step over step pattern with bilateral railings to safely enter and exit home.    Time  4    Period  Weeks    Status  On-going      PT LONG TERM GOAL #4   Title  Patient will demonstrate less than 2/10 pain with functional activities and ADLs.    Time  4    Period  Weeks    Status  On-going            Plan - 11/04/17 1521    Clinical Impression Statement  Patient tolerated treatment well today, yet did not want to do nustep today. Today focused on quad activation and ROM. Performed gentle manual PROM for right knee flex/ext with gentle range and was able to improve ROM today. Goals progressing.     Rehab Potential  Excellent    PT Frequency  3x / week    PT Duration  4 weeks    PT Treatment/Interventions  ADLs/Self Care Home Management;Cryotherapy;Publishing copy;Therapeutic activities;Therapeutic exercise;Balance training;Patient/family education;Neuromuscular re-education;Manual techniques;Dry needling;Passive range of motion;Taping;Vasopneumatic Device;Scar mobilization    PT Next Visit Plan  cont with TKA protocol, modalities PRN for pain relief, progress to nustep    Consulted and Agree with Plan of Care  Patient       Patient will benefit from skilled therapeutic intervention in order to improve the following deficits and impairments:  Abnormal gait, Decreased activity tolerance, Decreased range of motion, Decreased strength, Increased edema, Pain, Decreased balance  Visit Diagnosis: Stiffness of right knee, not elsewhere classified  Chronic pain of right knee  Localized edema  Problem List Patient Active Problem List   Diagnosis Date Noted  . Iron deficiency anemia due to chronic blood loss   . Symptomatic anemia  07/09/2017  . SIRS (systemic inflammatory response syndrome) (HCC) 07/09/2017  . Hypokalemia 07/09/2017  . Bronchitis 07/09/2017  . Arthritis 07/09/2017  . URI (upper respiratory infection) 07/09/2017  . OA (osteoarthritis) of knee 01/26/2017  . Nephrolithiasis 11/15/2013  . Chest pain 10/22/2012  . Migraine 10/22/2012  . Numbness and tingling in hands 10/22/2012    ,  P, PTA 11/04/2017, 3:30 PM  [facility] [address] [place], [place], [address] Phone: [phone]   Fax:  [phone]  Name: [name] [name] MRN: [id number] Date of Birth: [birth year]

## 2017-11-06 ENCOUNTER — Ambulatory Visit: Payer: 59 | Admitting: Physical Therapy

## 2017-11-06 ENCOUNTER — Encounter: Payer: Self-pay | Admitting: Physical Therapy

## 2017-11-06 DIAGNOSIS — R600 Localized edema: Secondary | ICD-10-CM

## 2017-11-06 DIAGNOSIS — M25661 Stiffness of right knee, not elsewhere classified: Secondary | ICD-10-CM | POA: Diagnosis not present

## 2017-11-06 DIAGNOSIS — M25561 Pain in right knee: Secondary | ICD-10-CM

## 2017-11-06 DIAGNOSIS — G8929 Other chronic pain: Secondary | ICD-10-CM

## 2017-11-06 NOTE — Therapy (Signed)
[facility] [address] [place], [place], [address] Phone: [phone]   Fax:  [phone]  Physical Therapy Treatment  Patient Details  Name: [name] [name] MRN: [id number] Date of Birth: January 03, 1961 Referring Provider: Ollen GrossFrank Aluisio MD.   Encounter Date: 11/06/2017  PT End of Session - 11/06/17 1102    Visit Number  4    Number of Visits  12    Date for PT Re-Evaluation  12/11/17    PT Start Time  0900    PT Stop Time  0953    PT Time Calculation (min)  53 min    Activity Tolerance  Patient tolerated treatment well    Behavior During Therapy  [facility] for tasks assessed/performed       Past Medical History:  Diagnosis Date  . Anemia 07/2017   on meds  . Arthritis   . Black-out (not amnesia) 06/2017   black out spells due to anemia!  Marland Kitchen. Blood transfusion without reported diagnosis    Pt thinks she had 3 units blood in Dec, 2018  . GERD (gastroesophageal reflux disease)   . History of kidney stones   . Kidney stones   . Migraine   . Restless leg syndrome   . Swelling of lower limb    bialteral lower leg swelling    Past Surgical History:  Procedure Laterality Date  . MULTIPLE TOOTH EXTRACTIONS    . right eye lid surgery     . TOTAL KNEE ARTHROPLASTY Left 01/26/2017   Procedure: LEFT TOTAL KNEE ARTHROPLASTY;  Surgeon: Ollen GrossAluisio, Frank, MD;  Location: [facility];  Service: Orthopedics;  Laterality: Left;  with block  . TOTAL KNEE ARTHROPLASTY Right 10/26/2017   Procedure: RIGHT TOTAL KNEE ARTHROPLASTY;  Surgeon: Ollen GrossAluisio, Frank, MD;  Location: [facility];  Service: Orthopedics;  Laterality: Right;  . TUBAL LIGATION      There were no vitals filed for this visit.  Subjective Assessment - 11/06/17 0941    Subjective  About the same.    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      Modalities   Modalities  Estate agentlectrical Stimulation;Vasopneumatic      Electrical Stimulation   Electrical Stimulation Location  Right knee.    Electrical Stimulation Action  1-10 Hz x 20 minutes.    Electrical Stimulation Parameters  IFC    Electrical Stimulation Goals  Edema;Pain      Vasopneumatic   Number Minutes Vasopneumatic   20 minutes    Vasopnuematic Location   -- Right knee.    Vasopneumatic Pressure  Medium      Manual Therapy   Manual Therapy  Passive ROM    Passive ROM  In supine PROM x 8 minutes into right knee flexion and extension.               PT  Short Term Goals - 11/02/17 1543      PT SHORT TERM GOAL #1   Title  Independent with an initial HEP.    Time  2    Period  Weeks    Status  On-going        PT Long Term Goals - 11/02/17 1543      PT LONG TERM GOAL #1   Title  Patient will improve left knee AROM to 0-120 degrees + to normalize gait pattern and to perform functional activities.    Time  4    Period  Weeks    Status  On-going      PT LONG TERM GOAL #2   Title  Patient will improve L knee MMT to 5/5 for stability during functional activities.    Time  4    Status  On-going      PT LONG TERM GOAL #3   Title  Patient will negotiate steps with step over step pattern with bilateral railings to safely enter and exit home.    Time  4    Period  Weeks    Status  On-going      PT LONG TERM GOAL #4   Title  Patient will demonstrate less than 2/10 pain with functional activities and ADLs.    Time  4    Period  Weeks    Status  On-going            Plan - 11/06/17 1024    Clinical Impression Statement  The patient did an excellent job on the [employer]. PROM easily past  90 degrees today.    PT Treatment/Interventions  ADLs/Self Care Home Management;Cryotherapy;Publishing copy;Therapeutic activities;Therapeutic exercise;Balance training;Patient/family education;Neuromuscular re-education;Manual techniques;Dry needling;Passive range of motion;Taping;Vasopneumatic Device;Scar mobilization    PT Next Visit Plan  cont with TKA protocol, modalities PRN for pain relief, progress to nustep    PT Home Exercise Plan  quad sets, seated heel prop for extension    Consulted and Agree with Plan of Care  Patient    Family Member Consulted  Daughter, Gigi Gin.       Patient will benefit from skilled therapeutic intervention in order to improve the following deficits and impairments:  Abnormal gait, Decreased activity tolerance, Decreased range of motion, Decreased strength, Increased edema, Pain, Decreased balance  Visit Diagnosis: Stiffness of right knee, not elsewhere classified  Chronic pain of right knee  Localized edema     Problem List Patient Active Problem List   Diagnosis Date Noted  . Iron deficiency anemia due to chronic blood loss   . Symptomatic anemia 07/09/2017  . SIRS (systemic inflammatory response syndrome) (HCC) 07/09/2017  . Hypokalemia 07/09/2017  . Bronchitis 07/09/2017  . Arthritis 07/09/2017  . URI (upper respiratory infection) 07/09/2017  . OA (osteoarthritis) of knee 01/26/2017  . Nephrolithiasis 11/15/2013  . Chest pain 10/22/2012  . Migraine 10/22/2012  . Numbness and tingling in hands 10/22/2012    , [place] 11/06/2017, 11:09 AM  [facility] [address] [place], [place], [address] Phone: [phone]   Fax:  [phone]  Name: [name] [name] MRN: [id number] Date of Birth: 19-Oct-1960

## 2017-11-09 ENCOUNTER — Encounter: Payer: Self-pay | Admitting: Physical Therapy

## 2017-11-09 ENCOUNTER — Ambulatory Visit: Payer: 59 | Admitting: Physical Therapy

## 2017-11-09 DIAGNOSIS — M25561 Pain in right knee: Secondary | ICD-10-CM

## 2017-11-09 DIAGNOSIS — M25661 Stiffness of right knee, not elsewhere classified: Secondary | ICD-10-CM | POA: Diagnosis not present

## 2017-11-09 DIAGNOSIS — R600 Localized edema: Secondary | ICD-10-CM

## 2017-11-09 DIAGNOSIS — G8929 Other chronic pain: Secondary | ICD-10-CM

## 2017-11-09 NOTE — Therapy (Signed)
[facility] [address] [place], [place], [address] Phone: [phone]   Fax:  [phone]  Physical Therapy Treatment  Patient Details  Name: [name] [name] MRN: [id number] Date of Birth: May 26, 1961 Referring Provider: Ollen GrossFrank Aluisio MD.   Encounter Date: 11/09/2017  PT End of Session - 11/09/17 1307    Visit Number  5    Number of Visits  12    Date for PT Re-Evaluation  12/11/17    PT Start Time  1302    PT Stop Time  1351    PT Time Calculation (min)  49 min    Activity Tolerance  Patient tolerated treatment well    Behavior During Therapy  [facility] for tasks assessed/performed       Past Medical History:  Diagnosis Date  . Anemia 07/2017   on meds  . Arthritis   . Black-out (not amnesia) 06/2017   black out spells due to anemia!  Marland Kitchen. Blood transfusion without reported diagnosis    Pt thinks she had 3 units blood in Dec, 2018  . GERD (gastroesophageal reflux disease)   . History of kidney stones   . Kidney stones   . Migraine   . Restless leg syndrome   . Swelling of lower limb    bialteral lower leg swelling    Past Surgical History:  Procedure Laterality Date  . MULTIPLE TOOTH EXTRACTIONS    . right eye lid surgery     . TOTAL KNEE ARTHROPLASTY Left 01/26/2017   Procedure: LEFT TOTAL KNEE ARTHROPLASTY;  Surgeon: Ollen GrossAluisio, Frank, MD;  Location: [facility];  Service: Orthopedics;  Laterality: Left;  with block  . TOTAL KNEE ARTHROPLASTY Right 10/26/2017   Procedure: RIGHT TOTAL KNEE ARTHROPLASTY;  Surgeon: Ollen GrossAluisio, Frank, MD;  Location: [facility];  Service: Orthopedics;  Laterality: Right;  . TUBAL LIGATION      There were no vitals filed for this visit.  Subjective Assessment - 11/09/17 1304    Subjective  Reports that she has had increased pain over the weekend to which she correlated with starting nustep and exercises. Reported no relief with any medication she took.    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  Yes    Pain Score  8     Pain Location  Knee    Pain Orientation  Right    Pain Descriptors / Indicators  Discomfort    Pain Type  Surgical pain    Pain Onset  1 to 4 weeks ago    Pain Frequency  Constant         [facility] PT Assessment - 11/09/17 0001      Assessment   Medical Diagnosis  Right total knee replacement    Onset Date/Surgical Date  10/26/17    Next MD Visit  11/10/2017    Prior Therapy  yes      Restrictions   Weight Bearing Restrictions  No      Observation/Other Assessments-Edema    Edema  Circumferential      Circumferential Edema   Circumferential - Right  48.5 cm    Circumferential - Left   45.6 cm      ROM / Strength   AROM / PROM / Strength  AROM  AROM   Overall AROM   Deficits    AROM Assessment Site  Knee    Right/Left Knee  Right    Right Knee Extension  -10    Right Knee Flexion  105                   [facility] Adult PT Treatment/Exercise - 11/09/17 0001      Knee/Hip Exercises: Aerobic   Nustep  L2, seat 9 x10 min      Knee/Hip Exercises: Standing   Forward Lunges  Right;15 reps;3 seconds    Rocker Board  2 minutes      Modalities   Modalities  Programmer, applications Location  R knee    Electrical Stimulation Action  IFC    Electrical Stimulation Parameters  1-10 hz x15 min    Electrical Stimulation Goals  Edema;Pain      Vasopneumatic   Number Minutes Vasopneumatic   15 minutes    Vasopnuematic Location   Knee    Vasopneumatic Pressure  Medium    Vasopneumatic Temperature   34      Manual Therapy   Manual Therapy  Passive ROM    Passive ROM  PROM of R knee into flexion, ext with oscillations to promote relaxation               PT Short Term Goals - 11/02/17  1543      PT SHORT TERM GOAL #1   Title  Independent with an initial HEP.    Time  2    Period  Weeks    Status  On-going        PT Long Term Goals - 11/02/17 1543      PT LONG TERM GOAL #1   Title  Patient will improve left knee AROM to 0-120 degrees + to normalize gait pattern and to perform functional activities.    Time  4    Period  Weeks    Status  On-going      PT LONG TERM GOAL #2   Title  Patient will improve L knee MMT to 5/5 for stability during functional activities.    Time  4    Status  On-going      PT LONG TERM GOAL #3   Title  Patient will negotiate steps with step over step pattern with bilateral railings to safely enter and exit home.    Time  4    Period  Weeks    Status  On-going      PT LONG TERM GOAL #4   Title  Patient will demonstrate less than 2/10 pain with functional activities and ADLs.    Time  4    Period  Weeks    Status  On-going            Plan - 11/09/17 1337    Clinical Impression Statement  Patient tolerated today's treatment fairly well with reports of increased pain over the weekend secondary to progressing PT. Light resistance and exercises completed with instructions to complete lightly as to not exaggerate pain. Gentle PROM of R knee into flexion and extension with oscillations to promote relaxation. AROM of R knee measured as 10-105 deg today. R knee edema 2.9 cm greater than L knee. Patient still ambulating with FWW and B TED hose donned. Normal modalities response noted following removal of the modalities.    Rehab Potential  Excellent  PT Frequency  3x / week    PT Duration  4 weeks    PT Treatment/Interventions  ADLs/Self Care Home Management;Cryotherapy;Publishing copy;Therapeutic activities;Therapeutic exercise;Balance training;Patient/family education;Neuromuscular re-education;Manual techniques;Dry needling;Passive range of motion;Taping;Vasopneumatic Device;Scar  mobilization    PT Next Visit Plan  cont with TKA protocol, modalities PRN for pain relief, progress to nustep    PT Home Exercise Plan  quad sets, seated heel prop for extension    Consulted and Agree with Plan of Care  Patient    Family Member Consulted  Daughter, Gigi Gin.       Patient will benefit from skilled therapeutic intervention in order to improve the following deficits and impairments:  Abnormal gait, Decreased activity tolerance, Decreased range of motion, Decreased strength, Increased edema, Pain, Decreased balance  Visit Diagnosis: Stiffness of right knee, not elsewhere classified  Chronic pain of right knee  Localized edema     Problem List Patient Active Problem List   Diagnosis Date Noted  . Iron deficiency anemia due to chronic blood loss   . Symptomatic anemia 07/09/2017  . SIRS (systemic inflammatory response syndrome) (HCC) 07/09/2017  . Hypokalemia 07/09/2017  . Bronchitis 07/09/2017  . Arthritis 07/09/2017  . URI (upper respiratory infection) 07/09/2017  . OA (osteoarthritis) of knee 01/26/2017  . Nephrolithiasis 11/15/2013  . Chest pain 10/22/2012  . Migraine 10/22/2012  . Numbness and tingling in hands 10/22/2012   Marvell Fuller, PTA 11/09/17 2:18 PM   [facility] [address] [place], [place], [address] Phone: [phone]   Fax:  [phone]  Name: [name] [name] MRN: [id number] Date of Birth: Oct 27, 1960

## 2017-11-11 ENCOUNTER — Encounter: Payer: 59 | Admitting: Physical Therapy

## 2017-11-12 ENCOUNTER — Ambulatory Visit: Payer: 59 | Admitting: Physical Therapy

## 2017-11-12 ENCOUNTER — Encounter: Payer: Self-pay | Admitting: Physical Therapy

## 2017-11-12 DIAGNOSIS — G8929 Other chronic pain: Secondary | ICD-10-CM

## 2017-11-12 DIAGNOSIS — M25661 Stiffness of right knee, not elsewhere classified: Secondary | ICD-10-CM | POA: Diagnosis not present

## 2017-11-12 DIAGNOSIS — R600 Localized edema: Secondary | ICD-10-CM

## 2017-11-12 DIAGNOSIS — M25561 Pain in right knee: Secondary | ICD-10-CM

## 2017-11-12 NOTE — Therapy (Signed)
[facility] [address] [place], [place], [address] Phone: [phone]   Fax:  [phone]  Physical Therapy Treatment  Patient Details  Name: [name] [name] MRN: [id number] Date of Birth: 1960-08-21 Referring Provider: Ollen Gross MD.   Encounter Date: 11/12/2017  PT End of Session - 11/12/17 1335    Visit Number  6    Number of Visits  12    Date for PT Re-Evaluation  12/11/17    PT Start Time  1259    PT Stop Time  1354    PT Time Calculation (min)  55 min    Activity Tolerance  Patient tolerated treatment well    Behavior During Therapy  [facility] for tasks assessed/performed       Past Medical History:  Diagnosis Date  . Anemia 07/2017   on meds  . Arthritis   . Black-out (not amnesia) 06/2017   black out spells due to anemia!  Marland Kitchen Blood transfusion without reported diagnosis    Pt thinks she had 3 units blood in Dec, 2018  . GERD (gastroesophageal reflux disease)   . History of kidney stones   . Kidney stones   . Migraine   . Restless leg syndrome   . Swelling of lower limb    bialteral lower leg swelling    Past Surgical History:  Procedure Laterality Date  . MULTIPLE TOOTH EXTRACTIONS    . right eye lid surgery     . TOTAL KNEE ARTHROPLASTY Left 01/26/2017   Procedure: LEFT TOTAL KNEE ARTHROPLASTY;  Surgeon: Ollen Gross, MD;  Location: [facility];  Service: Orthopedics;  Laterality: Left;  with block  . TOTAL KNEE ARTHROPLASTY Right 10/26/2017   Procedure: RIGHT TOTAL KNEE ARTHROPLASTY;  Surgeon: Ollen Gross, MD;  Location: [facility];  Service: Orthopedics;  Laterality: Right;  . TUBAL LIGATION      There were no vitals filed for this visit.  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  Yes    Pain Score  8     Pain Location  Knee    Pain Orientation  Right    Pain Descriptors / Indicators  Discomfort;Sore    Pain Type  Surgical pain    Pain Onset  1 to 4 weeks ago    Pain Frequency  Constant    Aggravating Factors   activity /ROM    Pain Relieving Factors  rest         [facility] PT Assessment - 11/12/17 0001      AROM   Right Knee Extension  -8    Right Knee Flexion  100      PROM   Right Knee Extension  -5    Right Knee Flexion  110                   [facility] Adult PT Treatment/Exercise - 11/12/17 0001      Knee/Hip Exercises: Stretches   Knee: Self-Stretch to increase Flexion  Right;3 reps;30 seconds      Knee/Hip Exercises: Aerobic   Nustep  x89min L3      Knee/Hip Exercises: Standing   Rocker Board  2 minutes      Electrical Stimulation  Electrical Stimulation Location  R knee    Engineer, manufacturing  IFC    Electrical Stimulation Parameters  1-10hz  x5min    Electrical Stimulation Goals  Edema;Pain      Vasopneumatic   Number Minutes Vasopneumatic   15 minutes    Vasopnuematic Location   Knee    Vasopneumatic Pressure  Medium      Manual Therapy   Manual Therapy  Passive ROM    Passive ROM  PROM of R knee into flexion, ext with oscillations to promote relaxation               PT Short Term Goals - 11/02/17 1543      PT SHORT TERM GOAL #1   Title  Independent with an initial HEP.    Time  2    Period  Weeks    Status  On-going        PT Long Term Goals - 11/02/17 1543      PT LONG TERM GOAL #1   Title  Patient will improve left knee AROM to 0-120 degrees + to normalize gait pattern and to perform functional activities.    Time  4    Period  Weeks    Status  On-going      PT LONG TERM GOAL #2   Title  Patient will improve L knee MMT to 5/5 for stability during functional activities.    Time  4    Status  On-going      PT LONG TERM GOAL #3   Title  Patient will negotiate steps with step  over step pattern with bilateral railings to safely enter and exit home.    Time  4    Period  Weeks    Status  On-going      PT LONG TERM GOAL #4   Title  Patient will demonstrate less than 2/10 pain with functional activities and ADLs.    Time  4    Period  Weeks    Status  On-going            Plan - 11/12/17 1336    Clinical Impression Statement  Patient tolerated treatment well today. Patient progressing with ROM in right knee for both flexion and ext. Patient able to progress with right LE exercises. Patient is now off FWW and using a SPC. Patient progressing toward goals.     Rehab Potential  Excellent    PT Frequency  3x / week    PT Duration  4 weeks    PT Treatment/Interventions  ADLs/Self Care Home Management;Cryotherapy;Publishing copy;Therapeutic activities;Therapeutic exercise;Balance training;Patient/family education;Neuromuscular re-education;Manual techniques;Dry needling;Passive range of motion;Taping;Vasopneumatic Device;Scar mobilization    PT Next Visit Plan  cont with TKA protocol, modalities PRN for pain relief, progress nustep    Consulted and Agree with Plan of Care  Patient       Patient will benefit from skilled therapeutic intervention in order to improve the following deficits and impairments:  Abnormal gait, Decreased activity tolerance, Decreased range of motion, Decreased strength, Increased edema, Pain, Decreased balance  Visit Diagnosis: Stiffness of right knee, not elsewhere classified  Chronic pain of right knee  Localized edema     Problem List Patient Active Problem List   Diagnosis Date Noted  . Iron deficiency anemia due to chronic blood loss   . Symptomatic anemia 07/09/2017  . SIRS (systemic inflammatory response syndrome) (HCC) 07/09/2017  . Hypokalemia 07/09/2017  . Bronchitis 07/09/2017  .  Arthritis 07/09/2017  . URI (upper respiratory infection) 07/09/2017  . OA  (osteoarthritis) of knee 01/26/2017  . Nephrolithiasis 11/15/2013  . Chest pain 10/22/2012  . Migraine 10/22/2012  . Numbness and tingling in hands 10/22/2012    ,  P, PTA 11/12/2017, 1:54 PM  [facility] [address] [place], [place], [address] Phone: [phone]   Fax:  [phone]  Name: [name] [name] MRN: [id number] Date of Birth: June 01, 1961

## 2017-11-13 ENCOUNTER — Ambulatory Visit (INDEPENDENT_AMBULATORY_CARE_PROVIDER_SITE_OTHER): Payer: 59 | Admitting: Gastroenterology

## 2017-11-13 ENCOUNTER — Encounter: Payer: Self-pay | Admitting: Gastroenterology

## 2017-11-13 VITALS — BP 118/78 | HR 72 | Ht 63.75 in | Wt 184.4 lb

## 2017-11-13 DIAGNOSIS — K5100 Ulcerative (chronic) pancolitis without complications: Secondary | ICD-10-CM

## 2017-11-13 MED ORDER — MESALAMINE ER 0.375 G PO CP24: 1.5000 g | ORAL_CAPSULE | Freq: Every day | ORAL | 3 refills | Status: DC

## 2017-11-13 NOTE — Progress Notes (Signed)
[name] [name]    [id number]    02-03-1961  Primary Care Physician:Butler, Caren Griffins, DO  Referring Physician: Octavio Graves, [address] [place], [place] [address]  Chief complaint: Ulcerative colitis  HPI:  57 year old female with history of chronic GERD, osteoarthritis here for follow-up visit after findings of ulcerative colitis incidentally on screening colonoscopy August 31, 2017.  She had findings suggestive of inflammation with mild erythema and erosions throughout the colon, biopsies showed chronic active colitis consistent with inflammatory bowel disease.  Patient denies excessive use of NSAIDs. Denies any nausea, vomiting, abdominal pain, melena or bright red blood per rectum.  She is currently on Apriso 1.5 g daily and is asymptomatic.  She recently underwent right knee replacement.    Outpatient Encounter Medications as of 11/13/2017  Medication Sig  . esomeprazole (NEXIUM) 20 MG capsule Take 20 mg by mouth daily.  . ferrous sulfate 325 (65 FE) MG tablet Take 325 mg by mouth daily with breakfast.  . furosemide (LASIX) 20 MG tablet Take 20 mg by mouth daily as needed for fluid.   Marland Kitchen gabapentin (NEURONTIN) 400 MG capsule Take 400-800 mg by mouth at bedtime as needed (for pain).  . Melatonin 5 MG TABS Take 1-2 tablets by mouth at bedtime as needed (sleep).  . mesalamine (APRISO) 0.375 g 24 hr capsule Take 4 capsules (1.5 g total) by mouth daily. Take for Ulcerative Colitis  . methocarbamol (ROBAXIN) 500 MG tablet Take 1 tablet (500 mg total) by mouth every 6 (six) hours as needed for muscle spasms.  Marland Kitchen oxyCODONE (OXY IR/ROXICODONE) 5 MG immediate release tablet Take 1-2 tablets by mouth every 6 (six) hours.  . rivaroxaban (XARELTO) 10 MG TABS tablet Take 1 tablet (10 mg total) by mouth daily with breakfast. Take Xarelto for two and a half more weeks following discharge from the hospital, then discontinue Xarelto. Once the patient has completed the blood  thinner regimen, then take a Baby 81 mg Aspirin daily for three more weeks.  . [DISCONTINUED] mesalamine (APRISO) 0.375 g 24 hr capsule Take 4 capsules (1.5 g total) by mouth daily. Take for Ulcerative Colitis (Patient taking differently: Take 375 mg by mouth 4 (four) times daily. Take for Ulcerative Colitis)  . Multiple Vitamins-Minerals (CENTRUM SILVER PO) Take 1 tablet by mouth daily.  . [DISCONTINUED] ipratropium-albuterol (DUONEB) 0.5-2.5 (3) MG/3ML SOLN Take 3 mLs by nebulization every 6 (six) hours as needed. (Patient not taking: Reported on 10/12/2017)  . [DISCONTINUED] Melatonin 1 MG CAPS Take 1-2 mg by mouth at bedtime as needed (for sleep).   . [DISCONTINUED] oxyCODONE 10 MG TABS Take 1-1.5 tablets (10-15 mg total) by mouth every 4 (four) hours as needed for moderate pain or severe pain.  . [DISCONTINUED] traMADol (ULTRAM) 50 MG tablet Take 1-2 tablets (50-100 mg total) by mouth every 6 (six) hours as needed (mild pain).   No facility-administered encounter medications on file as of 11/13/2017.     Allergies as of 11/13/2017 - Review Complete 11/13/2017  Allergen Reaction Noted  . Acetaminophen Other (See Comments) 08/31/2015  . Penicillins Hives and Other (See Comments) 01/14/2017  . Topamax [topiramate] Other (See Comments) 08/31/2015    Past Medical History:  Diagnosis Date  . Anemia 07/2017   on meds  . Arthritis   . Black-out (not amnesia) 06/2017   black out spells due to anemia!  Marland Kitchen Blood transfusion without reported diagnosis    Pt thinks  she had 3 units blood in Dec, 2018  . GERD (gastroesophageal reflux disease)   . History of kidney stones   . Kidney stones   . Migraine   . Restless leg syndrome   . Swelling of lower limb    bialteral lower leg swelling    Past Surgical History:  Procedure Laterality Date  . MULTIPLE TOOTH EXTRACTIONS    . right eye lid surgery     . TOTAL KNEE ARTHROPLASTY Left 01/26/2017   Procedure: LEFT TOTAL KNEE ARTHROPLASTY;  Surgeon:  Gaynelle Arabian, MD;  Location: [facility];  Service: Orthopedics;  Laterality: Left;  with block  . TOTAL KNEE ARTHROPLASTY Right 10/26/2017   Procedure: RIGHT TOTAL KNEE ARTHROPLASTY;  Surgeon: Gaynelle Arabian, MD;  Location: [facility];  Service: Orthopedics;  Laterality: Right;  . TUBAL LIGATION      Family History  Problem Relation Age of Onset  . Hypertension Mother   . Diabetes Mellitus II Mother   . Hypertension Father   . Diabetes Mellitus II Father   . Colon polyps Father     Social History   Socioeconomic History  . Marital status: Divorced    Spouse name: Not on file  . Number of children: Not on file  . Years of education: Not on file  . Highest education level: Not on file  Occupational History  . Not on file  Social Needs  . Financial resource strain: Not on file  . Food insecurity:    Worry: Not on file    Inability: Not on file  . Transportation needs:    Medical: Not on file    Non-medical: Not on file  Tobacco Use  . Smoking status: Never Smoker  . Smokeless tobacco: Never Used  Substance and Sexual Activity  . Alcohol use: No    Frequency: Never  . Drug use: No  . Sexual activity: Yes    Birth control/protection: Surgical  Lifestyle  . Physical activity:    Days per week: Not on file    Minutes per session: Not on file  . Stress: Not on file  Relationships  . Social connections:    Talks on phone: Not on file    Gets together: Not on file    Attends religious service: Not on file    Active member of club or organization: Not on file    Attends meetings of clubs or organizations: Not on file    Relationship status: Not on file  . Intimate partner violence:    Fear of current or ex partner: Not on file    Emotionally abused: Not on file    Physically abused: Not on file    Forced sexual activity: Not on file  Other Topics Concern  . Not on file  Social History Narrative  . Not on file      Review of systems: Review of Systems    Constitutional: Negative for fever and chills.  HENT: Negative.   Eyes: Negative for blurred vision.  Respiratory: Negative for cough, shortness of breath and wheezing.   Cardiovascular: Negative for chest pain and palpitations.  Gastrointestinal: as per HPI Genitourinary: Negative for dysuria, urgency, frequency and hematuria.  Musculoskeletal: Negative for myalgias, back pain and joint pain.  Skin: Negative for itching and rash.  Neurological: Negative for dizziness, tremors, focal weakness, seizures and loss of consciousness.  Endo/Heme/Allergies: Positive for seasonal allergies.  Psychiatric/Behavioral: Negative for depression, suicidal ideas and hallucinations.  All other systems reviewed and are  negative.   Physical Exam: Vitals:   11/13/17 0935  BP: 118/78  Pulse: 72   Body mass index is 31.9 kg/m. 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 Greater than 50% of the time used for counseling as well as treatment plan and follow-up. She had multiple questions which were answered to her satisfaction  K. Denzil Magnuson , MD [phone]    CC: Octavio Graves, DO

## 2017-11-13 NOTE — Patient Instructions (Addendum)
We will refill Apriso today   AVOID NSAIDS  Continue Multivitamins and Iron  Follow up in 6 months   If you are age 58 or older, your body mass index should be between 23-30. Your Body mass index is 31.9 kg/m. If this is out of the aforementioned range listed, please consider follow up with your Primary Care Provider.  If you are age 58 or younger, your body mass index should be between 19-25. Your Body mass index is 31.9 kg/m. If this is out of the aformentioned range listed, please consider follow up with your Primary Care Provider.    Thank You,  Marsa ArisKavitha Nandigam, MD

## 2017-11-16 ENCOUNTER — Ambulatory Visit: Payer: 59 | Admitting: Physical Therapy

## 2017-11-16 DIAGNOSIS — G8929 Other chronic pain: Secondary | ICD-10-CM

## 2017-11-16 DIAGNOSIS — M25661 Stiffness of right knee, not elsewhere classified: Secondary | ICD-10-CM | POA: Diagnosis not present

## 2017-11-16 DIAGNOSIS — M25561 Pain in right knee: Secondary | ICD-10-CM

## 2017-11-16 DIAGNOSIS — R600 Localized edema: Secondary | ICD-10-CM

## 2017-11-16 NOTE — Patient Instructions (Signed)
[name] [name]    Lie on side, legs bent 90. Open top knee to ceiling, rotating leg outward. Touch toes to ankle of bottom leg.  Repeat __10-30__ times. Repeat on other side. Do __1__ sessions per day.   Hip Flexion / Knee Extension: Straight-Leg Raise (Eccentric)   Lie on back. Lift leg with knee straight. Slowly lower leg for 3-5 seconds. _10__ reps per set, _1-3__ sets. 2x/day. Lower like elevator, stopping at each floor.   ABDUCTION: Side-Lying (Active)   Lie on left side, top leg straight. Raise top leg as far as possible. Complete 1-3__ sets of _10__ repetitions. Perform _2-3__ sessions per day.   [name] , PT 11/16/17 2:29 PM; [facility] [address] [place], [place], [address] Phone: [phone]   Fax:  [phone]

## 2017-11-16 NOTE — Therapy (Signed)
[facility] [address] [place], [place], [address] Phone: [phone]   Fax:  [phone]  Physical Therapy Treatment  Patient Details  Name: [name] PassyGinger S [name] MRN: [id number] Date of Birth: [birth year] Referring Provider: Ollen GrossFrank Aluisio MD.   Encounter Date: 11/16/2017  PT End of Session - 11/16/17 1352    Visit Number  7    Number of Visits  12    Date for PT Re-Evaluation  12/11/17    PT Start Time  1345    PT Stop Time  1440    PT Time Calculation (min)  55 min    Activity Tolerance  Patient tolerated treatment well    Behavior During Therapy  [facility] for tasks assessed/performed       Past Medical History:  Diagnosis Date  . Anemia 07/2017   on meds  . Arthritis   . Black-out (not amnesia) 06/2017   black out spells due to anemia!  Marland Kitchen. Blood transfusion without reported diagnosis    Pt thinks she had 3 units blood in Dec, 2018  . GERD (gastroesophageal reflux disease)   . History of kidney stones   . Kidney stones   . Migraine   . Restless leg syndrome   . Swelling of lower limb    bialteral lower leg swelling    Past Surgical History:  Procedure Laterality Date  . MULTIPLE TOOTH EXTRACTIONS    . right eye lid surgery     . TOTAL KNEE ARTHROPLASTY Left 01/26/2017   Procedure: LEFT TOTAL KNEE ARTHROPLASTY;  Surgeon: Ollen GrossAluisio, Frank, MD;  [facility];  Service: Orthopedics;  Laterality: Left;  with block  . TOTAL KNEE ARTHROPLASTY Right 10/26/2017   Procedure: RIGHT TOTAL KNEE ARTHROPLASTY;  Surgeon: Ollen GrossAluisio, Frank, MD;  Location: [facility];  Service: Orthopedics;  Laterality: Right;  . TUBAL LIGATION      There were no vitals filed for this visit.  Subjective Assessment - 11/16/17 1352    Subjective  Patient states she has stopped using her walker and cane. She has pain intermittently like after PT. She states she has a little bit of pain but rates it 7/10.    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  Yes    Pain Score  7     Pain Location  Knee    Pain Orientation  Right    Pain Descriptors / Indicators  Sore                       [facility] Adult PT Treatment/Exercise - 11/16/17 0001      Knee/Hip Exercises: Aerobic   Nustep  L5 x 11 min      Knee/Hip Exercises: Standing   Forward Lunges  Right;20 reps    Rocker Board  2 minutes post knee strech holding 20 sec at a time      Knee/Hip Exercises: Supine   Straight Leg Raises  Strengthening;Right;2 sets;10 reps      Knee/Hip Exercises: Sidelying   Hip ABduction  Strengthening;Right;2 sets;10 reps    Clams  2 x 10 Right      Modalities   Modalities  Electrical Stimulation;Vasopneumatic      Electrical Stimulation   Electrical Stimulation Location  R knee IFC 1-10 Hz x 15min    Electrical Stimulation Goals  Edema;Pain      Vasopneumatic   Number Minutes Vasopneumatic   15 minutes    Vasopnuematic Location   Knee    Vasopneumatic Pressure  Medium    Vasopneumatic Temperature   34      Manual Therapy   Manual Therapy  Passive ROM;Myofascial release    Myofascial Release  R ITB release     Passive ROM  Passive stretch into extension             PT Education - 11/16/17 1429    Education provided  Yes    Education Details  HEP    Person(s) Educated  Patient    Methods  Explanation;Demonstration;Handout    Comprehension  Verbalized understanding;Returned demonstration       PT Short Term Goals - 11/02/17 1543      PT SHORT TERM GOAL #1   Title  Independent with an initial HEP.    Time  2    Period  Weeks    Status  On-going        PT Long Term Goals - 11/02/17 1543      PT LONG TERM GOAL #1   Title  Patient will improve left knee AROM to 0-120 degrees + to normalize gait pattern and to perform functional activities.    Time  4    Period  Weeks    Status  On-going       PT LONG TERM GOAL #2   Title  Patient will improve L knee MMT to 5/5 for stability during functional activities.    Time  4    Status  On-going      PT LONG TERM GOAL #3   Title  Patient will negotiate steps with step over step pattern with bilateral railings to safely enter and exit home.    Time  4    Period  Weeks    Status  On-going      PT LONG TERM GOAL #4   Title  Patient will demonstrate less than 2/10 pain with functional activities and ADLs.    Time  4    Period  Weeks    Status  On-going            Plan - 11/16/17 1425    Clinical Impression Statement  Patient did very well with strengthening today. She also responded well to MFR to R lateral leg.     PT Treatment/Interventions  ADLs/Self Care Home Management;Cryotherapy;Publishing copy;Therapeutic activities;Therapeutic exercise;Balance training;Patient/family education;Neuromuscular re-education;Manual techniques;Dry needling;Passive range of motion;Taping;Vasopneumatic Device;Scar mobilization    PT Next Visit Plan  cont with TKA protocol, strengthening for hip and knee; modalities PRN for pain relief, progress nustep    PT Home Exercise Plan  quad sets, seated heel prop for extension, clams and SDLY hip ABD       Patient will benefit from skilled therapeutic intervention in order to improve the following deficits and impairments:  Abnormal gait, Decreased activity tolerance, Decreased range of motion, Decreased strength, Increased edema, Pain, Decreased balance  Visit Diagnosis: Stiffness of right knee, not elsewhere classified  Chronic pain of right knee  Localized edema     Problem List Patient Active Problem List   Diagnosis Date Noted  . Iron deficiency anemia due to chronic blood loss   . Symptomatic anemia 07/09/2017  . SIRS (systemic inflammatory response syndrome) (HCC) 07/09/2017  . Hypokalemia 07/09/2017  . Bronchitis 07/09/2017  .  Arthritis 07/09/2017  . URI (upper  respiratory infection) 07/09/2017  . OA (osteoarthritis) of knee 01/26/2017  . Nephrolithiasis 11/15/2013  . Chest pain 10/22/2012  . Migraine 10/22/2012  . Numbness and tingling in hands 10/22/2012    Solon Palm PT 11/16/2017, 2:48 PM  [facility] [address] [place], [place], [address] Phone: [phone]   Fax:  [phone]  Name: JAHNI PAUL MRN: [id number] Date of Birth: Jul 03, 1961

## 2017-11-18 ENCOUNTER — Encounter: Payer: 59 | Admitting: Physical Therapy

## 2017-11-23 ENCOUNTER — Encounter: Payer: Self-pay | Admitting: Physical Therapy

## 2017-11-23 ENCOUNTER — Ambulatory Visit: Payer: 59 | Admitting: Physical Therapy

## 2017-11-23 DIAGNOSIS — G8929 Other chronic pain: Secondary | ICD-10-CM

## 2017-11-23 DIAGNOSIS — R600 Localized edema: Secondary | ICD-10-CM

## 2017-11-23 DIAGNOSIS — M25661 Stiffness of right knee, not elsewhere classified: Secondary | ICD-10-CM | POA: Diagnosis not present

## 2017-11-23 DIAGNOSIS — M25561 Pain in right knee: Secondary | ICD-10-CM

## 2017-11-23 NOTE — Therapy (Signed)
[facility] [address] [place], [place], [address] Phone: [phone]   Fax:  [phone]  Physical Therapy Treatment  Patient Details  Name: [name] [name] MRN: [id number] Date of Birth: 15-Jun-1961 Referring Provider: Ollen GrossFrank Aluisio MD.   Encounter Date: 11/23/2017  PT End of Session - 11/23/17 1337    Visit Number  8    Number of Visits  12    Date for PT Re-Evaluation  12/11/17    PT Start Time  1300    PT Stop Time  1351    PT Time Calculation (min)  51 min    Activity Tolerance  Patient tolerated treatment well    Behavior During Therapy  [facility] for tasks assessed/performed       Past Medical History:  Diagnosis Date  . Anemia 07/2017   on meds  . Arthritis   . Black-out (not amnesia) 06/2017   black out spells due to anemia!  Marland Kitchen. Blood transfusion without reported diagnosis    Pt thinks she had 3 units blood in Dec, 2018  . GERD (gastroesophageal reflux disease)   . History of kidney stones   . Kidney stones   . Migraine   . Restless leg syndrome   . Swelling of lower limb    bialteral lower leg swelling    Past Surgical History:  Procedure Laterality Date  . MULTIPLE TOOTH EXTRACTIONS    . right eye lid surgery     . TOTAL KNEE ARTHROPLASTY Left 01/26/2017   Procedure: LEFT TOTAL KNEE ARTHROPLASTY;  Surgeon: Ollen GrossAluisio, Frank, MD;  Location: [facility];  Service: Orthopedics;  Laterality: Left;  with block  . TOTAL KNEE ARTHROPLASTY Right 10/26/2017   Procedure: RIGHT TOTAL KNEE ARTHROPLASTY;  Surgeon: Ollen GrossAluisio, Frank, MD;  Location: [facility];  Service: Orthopedics;  Laterality: Right;  . TUBAL LIGATION      There were no vitals filed for this visit.  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  Yes    Pain Score  6     Pain Location  Knee    Pain Orientation  Right    Pain Descriptors / Indicators  Sore;Discomfort    Pain Type  Surgical pain    Pain Onset  More than a month ago    Pain Frequency  Constant    Aggravating Factors   activity or ROM    Pain Relieving Factors  rest         [facility] PT Assessment - 11/23/17 0001      AROM   AROM Assessment Site  Knee    Right/Left Knee  Right    Right Knee Extension  -6    Right Knee Flexion  106      PROM   PROM Assessment Site  Knee    Right/Left Knee  Right    Right Knee Extension  -2    Right Knee Flexion  118                   [facility] Adult PT Treatment/Exercise - 11/23/17 0001      Knee/Hip Exercises:  Aerobic   Nustep  L5 x 10 min      Knee/Hip Exercises: Standing   Lateral Step Up  Right;Step Height: 6";2 sets;10 reps    Forward Step Up  Right;3 sets;10 reps;Step Height: 6"    Rocker Board  2 minutes      Electrical Stimulation   Electrical Stimulation Location  R knee IFC 1-10 Hz x    Electrical Stimulation Goals  Edema;Pain      Vasopneumatic   Number Minutes Vasopneumatic   15 minutes    Vasopnuematic Location   Knee    Vasopneumatic Pressure  Medium      Manual Therapy   Manual Therapy  Passive ROM    Passive ROM  PROM to right knee flexion/ext with gentle holds               PT Short Term Goals - 11/02/17 1543      PT SHORT TERM GOAL #1   Title  Independent with an initial HEP.    Time  2    Period  Weeks    Status  On-going        PT Long Term Goals - 11/02/17 1543      PT LONG TERM GOAL #1   Title  Patient will improve left knee AROM to 0-120 degrees + to normalize gait pattern and to perform functional activities.    Time  4    Period  Weeks    Status  On-going      PT LONG TERM GOAL #2   Title  Patient will improve L knee MMT to 5/5 for  stability during functional activities.    Time  4    Status  On-going      PT LONG TERM GOAL #3   Title  Patient will negotiate steps with step over step pattern with bilateral railings to safely enter and exit home.    Time  4    Period  Weeks    Status  On-going      PT LONG TERM GOAL #4   Title  Patient will demonstrate less than 2/10 pain with functional activities and ADLs.    Time  4    Period  Weeks    Status  On-going            Plan - 11/23/17 1338    Clinical Impression Statement  Patient tolerated treatment well today. Patient continues to progress with right knee ROM active and passively today. patient progressing with with right LE strengthening and able to perfom standing activities today. 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 Goals progressing.     Rehab Potential  Excellent    PT Frequency  3x / week    PT Duration  4 weeks    PT Treatment/Interventions  ADLs/Self Care Home Management;Cryotherapy;Publishing copy;Therapeutic activities;Therapeutic exercise;Balance training;Patient/family education;Neuromuscular re-education;Manual techniques;Dry needling;Passive range of motion;Taping;Vasopneumatic Device;Scar mobilization    PT Next Visit Plan  cont with TKA protocol, strengthening for hip and knee; modalities PRN for pain relief, progress nustep    Consulted and Agree with Plan of Care  Patient       Patient will benefit from skilled therapeutic intervention in order to improve the following deficits and impairments:  Abnormal gait, Decreased activity tolerance, Decreased range of motion, Decreased strength, Increased edema, Pain, Decreased balance  Visit Diagnosis: Stiffness of right knee, not elsewhere classified  Chronic pain of right knee  Localized edema     Problem List Patient Active Problem List   Diagnosis Date Noted  . Iron deficiency anemia due to  chronic blood loss   . Symptomatic anemia 07/09/2017  . SIRS (systemic inflammatory response syndrome) (H[facility]) 07/09/2017  . Hypokalemia 07/09/2017  . Bronchitis 07/09/2017  . Arthritis 07/09/2017  . URI (upper respiratory infection) 07/09/2017  . OA (osteoarthritis) of knee 01/26/2017  . Nephrolithiasis 11/15/2013  . Chest pain 10/22/2012  . Migraine 10/22/2012  . Numbness and tingling in hands 10/22/2012    ,  P, PTA 11/23/2017, 2:01 PM  [facility] [address] [place], [place], [address] Phone: [phone]   Fax:  [phone]  Name: [name] [name] MRN: [id number] Date of Birth: 08-Jun-1961

## 2017-11-25 ENCOUNTER — Encounter: Payer: Self-pay | Admitting: Physical Therapy

## 2017-11-25 ENCOUNTER — Ambulatory Visit: Payer: 59 | Admitting: Physical Therapy

## 2017-11-25 DIAGNOSIS — M25561 Pain in right knee: Secondary | ICD-10-CM

## 2017-11-25 DIAGNOSIS — M25661 Stiffness of right knee, not elsewhere classified: Secondary | ICD-10-CM

## 2017-11-25 DIAGNOSIS — R600 Localized edema: Secondary | ICD-10-CM

## 2017-11-25 DIAGNOSIS — G8929 Other chronic pain: Secondary | ICD-10-CM

## 2017-11-25 NOTE — Therapy (Signed)
[facility] [place], [place], [address] Phone: [phone]   Fax:  [phone]  Physical Therapy Treatment  Patient Details  Name: [name] [name] MRN: [id number] Date of Birth: 28-Oct-1960 Referring Provider: Gaynelle Arabian MD.   Encounter Date: 11/25/2017  PT End of Session - 11/25/17 1349    Visit Number  9    Number of Visits  12    Date for PT Re-Evaluation  12/11/17    PT Start Time  1344    PT Stop Time  1436    PT Time Calculation (min)  52 min    Activity Tolerance  Patient tolerated treatment well    Behavior During Therapy  [facility] for tasks assessed/performed       Past Medical History:  Diagnosis Date  . Anemia 07/2017   on meds  . Arthritis   . Black-out (not amnesia) 06/2017   black out spells due to anemia!  Marland Kitchen Blood transfusion without reported diagnosis    Pt thinks she had 3 units blood in Dec, 2018  . GERD (gastroesophageal reflux disease)   . History of kidney stones   . Kidney stones   . Migraine   . Restless leg syndrome   . Swelling of lower limb    bialteral lower leg swelling    Past Surgical History:  Procedure Laterality Date  . MULTIPLE TOOTH EXTRACTIONS    . right eye lid surgery     . TOTAL KNEE ARTHROPLASTY Left 01/26/2017   Procedure: LEFT TOTAL KNEE ARTHROPLASTY;  Surgeon: Gaynelle Arabian, MD;  Location: [facility];  Service: Orthopedics;  Laterality: Left;  with block  . TOTAL KNEE ARTHROPLASTY Right 10/26/2017   Procedure: RIGHT TOTAL KNEE ARTHROPLASTY;  Surgeon: Gaynelle Arabian, MD;  Location: [facility];  Service: Orthopedics;  Laterality: Right;  . TUBAL LIGATION      There were no vitals filed for this visit.  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   Yes    Pain Score  5     Pain Location  Ankle    Pain Orientation  Right    Pain Descriptors / Indicators  Discomfort    Pain Type  Surgical pain    Pain Onset  More than a month ago         [facility] PT Assessment - 11/25/17 0001      Assessment   Medical Diagnosis  Right total knee replacement    Onset Date/Surgical Date  10/26/17    Prior Therapy  yes      Restrictions   Weight Bearing Restrictions  No      ROM / Strength   AROM / PROM / Strength  AROM      AROM   Overall AROM   Deficits    AROM Assessment Site  Knee    Right/Left Knee  Right    Right Knee Extension  -5    Right Knee Flexion  120                   [facility] Adult PT Treatment/Exercise - 11/25/17 0001      Knee/Hip Exercises: Aerobic   Nustep  L5 x10 min  Knee/Hip Exercises: Standing   Forward Lunges  Right;20 reps    Forward Step Up  Right;2 sets;10 reps;Hand Hold: 2;Step Height: 6"    Rocker Board  2 minutes      Modalities   Modalities  Electrical Stimulation;Vasopneumatic      Acupuncturist Location  R knee     Electrical Stimulation Action  IFC    Electrical Stimulation Parameters  1-10 hz x15 min    Electrical Stimulation Goals  Edema;Pain      Vasopneumatic   Number Minutes Vasopneumatic   15 minutes    Vasopnuematic Location   Knee    Vasopneumatic Pressure  Medium    Vasopneumatic Temperature   34      Manual Therapy   Manual Therapy  Passive ROM    Passive ROM  PROM to right knee flexion/ext with gentle holds               PT Short Term Goals - 11/25/17 1421      PT SHORT TERM GOAL #1   Title  Independent with an initial HEP.    Time  2    Period  Weeks    Status  Achieved        PT Long Term Goals - 11/25/17 1420      PT LONG TERM GOAL #1   Title  Patient will improve left knee AROM to 0-120 degrees + to normalize gait pattern and to perform functional activities.    Time  4    Period  Weeks    Status   Partially Met AROM R knee 5-120 deg 11/25/2017      PT LONG TERM GOAL #2   Title  Patient will improve L knee MMT to 5/5 for stability during functional activities.    Time  4    Status  On-going      PT LONG TERM GOAL #3   Title  Patient will negotiate steps with step over step pattern with bilateral railings to safely enter and exit home.    Time  4    Period  Weeks    Status  On-going      PT LONG TERM GOAL #4   Title  Patient will demonstrate less than 2/10 pain with functional activities and ADLs.    Time  4    Period  Weeks    Status  On-going 3/10 as of 11/25/2017            Plan - 11/25/17 1511    Clinical Impression Statement  Patient tolerated today's treatment well with only complaints at end range R knee ROM. Patient able to tolerate exercises as directed and encouraged to complete within functional range. AROM of R knee measured as 5-120 deg today. Patient stil ambulating with antalgic gait and worried regarding R ankle pain that occurs predominately at night. Normal modalities response noted following removal of the modalities.     Rehab Potential  Excellent    PT Frequency  3x / week    PT Duration  4 weeks    PT Treatment/Interventions  ADLs/Self Care Home Management;Cryotherapy;Dentist;Therapeutic activities;Therapeutic exercise;Balance training;Patient/family education;Neuromuscular re-education;Manual techniques;Dry needling;Passive range of motion;Taping;Vasopneumatic Device;Scar mobilization    PT Next Visit Plan  cont with TKA protocol, strengthening for hip and knee; modalities PRN for pain relief, progress nustep    PT Home Exercise Plan  quad sets, seated heel prop for extension, clams and SDLY hip  ABD    Consulted and Agree with Plan of Care  Patient       Patient will benefit from skilled therapeutic intervention in order to improve the following deficits and impairments:  Abnormal gait, Decreased  activity tolerance, Decreased range of motion, Decreased strength, Increased edema, Pain, Decreased balance  Visit Diagnosis: Stiffness of right knee, not elsewhere classified  Chronic pain of right knee  Localized edema     Problem List Patient Active Problem List   Diagnosis Date Noted  . Iron deficiency anemia due to chronic blood loss   . Symptomatic anemia 07/09/2017  . SIRS (systemic inflammatory response syndrome) ([place]) 07/09/2017  . Hypokalemia 07/09/2017  . Bronchitis 07/09/2017  . Arthritis 07/09/2017  . URI (upper respiratory infection) 07/09/2017  . OA (osteoarthritis) of knee 01/26/2017  . Nephrolithiasis 11/15/2013  . Chest pain 10/22/2012  . Migraine 10/22/2012  . Numbness and tingling in hands 10/22/2012    Standley Brooking, PTA 11/25/2017, 3:30 PM  [facility] [place], [place], [address] Phone: [phone]   Fax:  [phone]  Name: [name] [name] MRN: [id number] Date of Birth: 1961/03/14

## 2017-11-26 ENCOUNTER — Ambulatory Visit: Payer: 59 | Admitting: *Deleted

## 2017-11-26 DIAGNOSIS — R600 Localized edema: Secondary | ICD-10-CM

## 2017-11-26 DIAGNOSIS — M25661 Stiffness of right knee, not elsewhere classified: Secondary | ICD-10-CM

## 2017-11-26 DIAGNOSIS — M25662 Stiffness of left knee, not elsewhere classified: Secondary | ICD-10-CM

## 2017-11-26 DIAGNOSIS — G8929 Other chronic pain: Secondary | ICD-10-CM

## 2017-11-26 DIAGNOSIS — M25562 Pain in left knee: Secondary | ICD-10-CM

## 2017-11-26 DIAGNOSIS — M25561 Pain in right knee: Secondary | ICD-10-CM

## 2017-11-26 NOTE — Therapy (Addendum)
[facility] [place], [place], [address] Phone: [phone]   Fax:  [phone]  Physical Therapy Treatment  Patient Details  Name: [name] [name] MRN: [id number] Date of Birth: [birth year] Referring Provider: Gaynelle Arabian MD.   Encounter Date: 11/26/2017  PT End of Session - 11/26/17 1402    Visit Number  10    Number of Visits  12    Date for PT Re-Evaluation  12/11/17    PT Start Time  1351    PT Stop Time  1450    PT Time Calculation (min)  59 min       Past Medical History:  Diagnosis Date  . Anemia 07/2017   on meds  . Arthritis   . Black-out (not amnesia) 06/2017   black out spells due to anemia!  Marland Kitchen Blood transfusion without reported diagnosis    Pt thinks she had 3 units blood in Dec, 2018  . GERD (gastroesophageal reflux disease)   . History of kidney stones   . Kidney stones   . Migraine   . Restless leg syndrome   . Swelling of lower limb    bialteral lower leg swelling    Past Surgical History:  Procedure Laterality Date  . MULTIPLE TOOTH EXTRACTIONS    . right eye lid surgery     . TOTAL KNEE ARTHROPLASTY Left 01/26/2017   Procedure: LEFT TOTAL KNEE ARTHROPLASTY;  Surgeon: Gaynelle Arabian, MD;  Location: [facility];  Service: Orthopedics;  Laterality: Left;  with block  . TOTAL KNEE ARTHROPLASTY Right 10/26/2017   Procedure: RIGHT TOTAL KNEE ARTHROPLASTY;  Surgeon: Gaynelle Arabian, MD;  Location: [facility];  Service: Orthopedics;  Laterality: Right;  . TUBAL LIGATION      There were no vitals filed for this visit.   Progress Note Reporting Period 10/30/17  to 11/27/17  See note below for Objective Data and Assessment of Progress/Goals.     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  Yes    Pain Score  5     Pain Location  Ankle    Pain Orientation  Right    Pain Descriptors / Indicators  Discomfort    Pain Type  Surgical pain    Pain Onset  More than a month ago                       [facility] Adult PT Treatment/Exercise - 11/26/17 0001      Knee/Hip Exercises: Aerobic   Stationary Bike  bike x 15 mins L1-2      Knee/Hip Exercises: Standing   Forward Lunges  Right;20 reps    Forward Step Up  Right;2 sets;10 reps;Hand Hold: 2;Step Height: 6"    Rocker Board  3 minutes      Electrical Stimulation   Electrical Stimulation Location  R knee IFC 1-10 Hz x 18mn    Electrical Stimulation Goals  Edema;Pain      Vasopneumatic   Number Minutes Vasopneumatic   15 minutes    Vasopnuematic Location   Knee    Vasopneumatic Pressure  Medium    Vasopneumatic Temperature   34      Manual Therapy  Manual Therapy  Passive ROM    Passive ROM  PROM to right knee ext with gentle holds   PROM 5-110 degrees today               PT Short Term Goals - 11/25/17 1421      PT SHORT TERM GOAL #1   Title  Independent with an initial HEP.    Time  2    Period  Weeks    Status  Achieved        PT Long Term Goals - 11/25/17 1420      PT LONG TERM GOAL #1   Title  Patient will improve left knee AROM to 0-120 degrees + to normalize gait pattern and to perform functional activities.    Time  4    Period  Weeks    Status  Partially Met AROM R knee 5-120 deg 11/25/2017      PT LONG TERM GOAL #2   Title  Patient will improve L knee MMT to 5/5 for stability during functional activities.    Time  4    Status  On-going      PT LONG TERM GOAL #3   Title  Patient will negotiate steps with step over step pattern with bilateral railings to safely enter and exit home.    Time  4    Period  Weeks    Status  On-going      PT LONG TERM GOAL #4   Title  Patient will demonstrate less than 2/10 pain with functional activities and ADLs.    Time   4    Period  Weeks    Status  On-going 3/10 as of 11/25/2017            Plan - 11/26/17 1406    Clinical Impression Statement  Pt arrived today doing fairly well with mainly moderate pain 4-6/10 during Rx. Her ROM was 5-110 degrees today and she was able to perform all exs with mainly fatigue. She  still ambulates with antalgic pattern    Clinical Presentation  Stable    Clinical Decision Making  Low    Rehab Potential  Excellent    PT Frequency  3x / week    PT Duration  4 weeks    PT Treatment/Interventions  ADLs/Self Care Home Management;Cryotherapy;Dentist;Therapeutic activities;Therapeutic exercise;Balance training;Patient/family education;Neuromuscular re-education;Manual techniques;Dry needling;Passive range of motion;Taping;Vasopneumatic Device;Scar mobilization    PT Next Visit Plan  cont with TKA protocol, strengthening for hip and knee; modalities PRN for pain relief, progress nustep    PT Home Exercise Plan  quad sets, seated heel prop for extension, clams and SDLY hip ABD    Consulted and Agree with Plan of Care  Patient       Patient will benefit from skilled therapeutic intervention in order to improve the following deficits and impairments:  Abnormal gait, Decreased activity tolerance, Decreased range of motion, Decreased strength, Increased edema, Pain, Decreased balance  Visit Diagnosis: Stiffness of right knee, not elsewhere classified  Chronic pain of right knee  Localized edema  Chronic pain of left knee  Stiffness of left knee, not elsewhere classified     Problem List Patient Active Problem List   Diagnosis Date Noted  . Iron deficiency anemia due to chronic blood loss   . Symptomatic anemia 07/09/2017  . SIRS (systemic inflammatory response syndrome) ([place]) 07/09/2017  . Hypokalemia 07/09/2017  . Bronchitis 07/09/2017  . Arthritis 07/09/2017  . URI (  upper respiratory infection) 07/09/2017   . OA (osteoarthritis) of knee 01/26/2017  . Nephrolithiasis 11/15/2013  . Chest pain 10/22/2012  . Migraine 10/22/2012  . Numbness and tingling in hands 10/22/2012    RAMSEUR,CHRIS, PTA 11/26/2017, 3:43 PM  [facility] [address] [place], [place], [address] Phone: [phone]   Fax:  [phone]  Name: [name] [name] MRN: [id number] Date of Birth: 03-27-1961  PHYSICAL THERAPY DISCHARGE SUMMARY  Visits from Start of Care: 10.  Current functional level related to goals / functional outcomes: See above.   Remaining deficits: See goal section.   Education / Equipment: HEP. Plan: Patient agrees to discharge.  Patient goals were not met. Patient is being discharged due to not returning since the last visit.  ?????         Mali Applegate MPT

## 2017-11-28 ENCOUNTER — Encounter: Payer: Self-pay | Admitting: Gastroenterology

## 2017-11-29 ENCOUNTER — Emergency Department (HOSPITAL_COMMUNITY)
Admission: EM | Admit: 2017-11-29 | Discharge: 2017-11-30 | Disposition: A | Discharge status: Home / Self Care | Payer: 59 | Attending: Emergency Medicine | Admitting: Emergency Medicine

## 2017-11-29 ENCOUNTER — Other Ambulatory Visit: Payer: Self-pay

## 2017-11-29 ENCOUNTER — Encounter (HOSPITAL_COMMUNITY): Payer: Self-pay | Admitting: Nurse Practitioner

## 2017-11-29 DIAGNOSIS — R0602 Shortness of breath: Secondary | ICD-10-CM | POA: Diagnosis not present

## 2017-11-29 DIAGNOSIS — K449 Diaphragmatic hernia without obstruction or gangrene: Secondary | ICD-10-CM

## 2017-11-29 DIAGNOSIS — M546 Pain in thoracic spine: Secondary | ICD-10-CM | POA: Diagnosis present

## 2017-11-29 DIAGNOSIS — Z96653 Presence of artificial knee joint, bilateral: Secondary | ICD-10-CM | POA: Insufficient documentation

## 2017-11-29 DIAGNOSIS — Z79899 Other long term (current) drug therapy: Secondary | ICD-10-CM | POA: Insufficient documentation

## 2017-11-29 LAB — CBC WITH DIFFERENTIAL/PLATELET
Basophils Absolute: 0 10*3/uL (ref 0.0–0.1)
Basophils Relative: 0 %
Eosinophils Absolute: 0.1 10*3/uL (ref 0.0–0.7)
Eosinophils Relative: 2 %
HCT: 41.6 % (ref 36.0–46.0)
Hemoglobin: 13.3 g/dL (ref 12.0–15.0)
Lymphocytes Relative: 15 %
Lymphs Abs: 0.6 10*3/uL — ABNORMAL LOW (ref 0.7–4.0)
MCH: 29.8 pg (ref 26.0–34.0)
MCHC: 32 g/dL (ref 30.0–36.0)
MCV: 93.3 fL (ref 78.0–100.0)
Monocytes Absolute: 0.2 10*3/uL (ref 0.1–1.0)
Monocytes Relative: 6 %
Neutro Abs: 3 10*3/uL (ref 1.7–7.7)
Neutrophils Relative %: 77 %
Platelets: 187 10*3/uL (ref 150–400)
RBC: 4.46 MIL/uL (ref 3.87–5.11)
RDW: 14.4 % (ref 11.5–15.5)
WBC: 3.9 10*3/uL — ABNORMAL LOW (ref 4.0–10.5)

## 2017-11-29 MED ORDER — SODIUM CHLORIDE 0.9 % IV SOLN
INTRAVENOUS | Status: DC
  Administered 2017-11-29: 23:00:00 via INTRAVENOUS

## 2017-11-29 NOTE — ED Provider Notes (Signed)
[facility] DEPT Provider Note   CSN: [id number] Arrival date & time: 11/29/17  2142     History   Chief Complaint Chief Complaint  Patient presents with  . Shortness of Breath  . Flank Pain    HPI [name] [name] is a 57 y.o. female.  HPI    57 year old female with vague bilateral flank to mid back pain shortness of breath.  Symptom onset yesterday.  Persistent since then.  She has dyspnea with only mild exertion.  She states that the feels similar to this when she felt with symptomatic anemia from ulcerative colitis previously.  She denies any abdominal pain though.  No blood in her stool or melena.  No fevers or chills.  No cough.  She did have right knee replacement 4 weeks ago.  She was on Xarelto prophylactically postop but has been off it for over a week.   Past Medical History:  Diagnosis Date  . Anemia 07/2017   on meds  . Arthritis   . Black-out (not amnesia) 06/2017   black out spells due to anemia!  Marland Kitchen Blood transfusion without reported diagnosis    Pt thinks she had 3 units blood in Dec, 2018  . GERD (gastroesophageal reflux disease)   . History of kidney stones   . Kidney stones   . Migraine   . Restless leg syndrome   . Swelling of lower limb    bialteral lower leg swelling    Patient Active Problem List   Diagnosis Date Noted  . Iron deficiency anemia due to chronic blood loss   . Symptomatic anemia 07/09/2017  . SIRS (systemic inflammatory response syndrome) ([place]) 07/09/2017  . Hypokalemia 07/09/2017  . Bronchitis 07/09/2017  . Arthritis 07/09/2017  . URI (upper respiratory infection) 07/09/2017  . OA (osteoarthritis) of knee 01/26/2017  . Nephrolithiasis 11/15/2013  . Chest pain 10/22/2012  . Migraine 10/22/2012  . Numbness and tingling in hands 10/22/2012    Past Surgical History:  Procedure Laterality Date  . MULTIPLE TOOTH EXTRACTIONS    . right eye lid surgery     . TOTAL KNEE ARTHROPLASTY Left  01/26/2017   Procedure: LEFT TOTAL KNEE ARTHROPLASTY;  Surgeon: Gaynelle Arabian, MD;  Location: [facility];  Service: Orthopedics;  Laterality: Left;  with block  . TOTAL KNEE ARTHROPLASTY Right 10/26/2017   Procedure: RIGHT TOTAL KNEE ARTHROPLASTY;  Surgeon: Gaynelle Arabian, MD;  Location: [facility];  Service: Orthopedics;  Laterality: Right;  . TUBAL LIGATION       OB History   None      Home Medications    Prior to Admission medications   Medication Sig Start Date End Date Taking? Authorizing Provider  esomeprazole (NEXIUM) 20 MG capsule Take 20 mg by mouth daily.    [provider]  ferrous sulfate 325 (65 FE) MG tablet Take 325 mg by mouth daily with breakfast.    [provider]  furosemide (LASIX) 20 MG tablet Take 20 mg by mouth daily as needed for fluid.     [provider]  gabapentin (NEURONTIN) 400 MG capsule Take 400-800 mg by mouth at bedtime as needed (for pain).    [provider]  Melatonin 5 MG TABS Take 1-2 tablets by mouth at bedtime as needed (sleep).    [provider]  mesalamine (APRISO) 0.375 g 24 hr capsule Take 4 capsules (1.5 g total) by mouth daily. Take for Ulcerative Colitis 11/13/17   Mauri Pole, MD  methocarbamol (ROBAXIN) 500 MG tablet Take 1 tablet (500 mg total) by mouth every 6 (six) hours as needed for muscle spasms. 10/27/17   Perkins, Alexzandrew L, PA-C  Multiple Vitamins-Minerals (CENTRUM SILVER PO) Take 1 tablet by mouth daily.    [provider]  oxyCODONE (OXY IR/ROXICODONE) 5 MG immediate release tablet Take 1-2 tablets by mouth every 6 (six) hours. 11/10/17   [provider]  rivaroxaban (XARELTO) 10 MG TABS tablet Take 1 tablet (10 mg total) by mouth daily with breakfast. Take Xarelto for two and a half more weeks following discharge from the hospital, then discontinue Xarelto. Once the patient has completed the blood thinner regimen, then take a Baby 81 mg Aspirin daily for three more  weeks. 10/28/17   Perkins, Alexzandrew L, PA-C    Family History Family History  Problem Relation Age of Onset  . Hypertension Mother   . Diabetes Mellitus II Mother   . Hypertension Father   . Diabetes Mellitus II Father   . Colon polyps Father     Social History Social History   Tobacco Use  . Smoking status: Never Smoker  . Smokeless tobacco: Never Used  Substance Use Topics  . Alcohol use: No    Frequency: Never  . Drug use: No     Allergies   Acetaminophen; Penicillins; and Topamax [topiramate]   Review of Systems Review of Systems  All systems reviewed and negative, other than as noted in HPI.  Physical Exam Updated Vital Signs BP 135/89   Pulse (!) 131   Temp 98.9 F (37.2 C) (Oral)   Resp (!) 22   LMP 06/24/2012   SpO2 100%   Physical Exam  Constitutional: She appears well-developed and well-nourished. No distress.  HENT:  Head: Normocephalic and atraumatic.  Eyes: Conjunctivae are normal. Right eye exhibits no discharge. Left eye exhibits no discharge.  Neck: Neck supple.  Cardiovascular: Regular rhythm and normal heart sounds. Exam reveals no gallop and no friction rub.  No murmur heard. Tachycardic  Pulmonary/Chest: Effort normal and breath sounds normal. No respiratory distress.  Abdominal: Soft. She exhibits no distension. There is no tenderness.  Musculoskeletal: She exhibits no edema or tenderness.  Right knee surgical site appears to be healing well.  Neurological: She is alert.  Skin: Skin is warm and dry.  Psychiatric: She has a normal mood and affect. Her behavior is normal. Thought content normal.  Nursing note and vitals reviewed.    ED Treatments / Results  Labs (all labs ordered are listed, but only abnormal results are displayed) Labs Reviewed  CBC WITH DIFFERENTIAL/PLATELET - Abnormal; Notable for the following components:      Result Value   WBC 3.9 (*)    Lymphs Abs 0.6 (*)    All other components within normal limits    BASIC METABOLIC PANEL - Abnormal; Notable for the following components:   CO2 20 (*)    Glucose, Bld 117 (*)    All other components within normal limits  TROPONIN I  URINALYSIS, ROUTINE W REFLEX MICROSCOPIC    EKG EKG Interpretation  Date/Time:  Sunday November 29 2017 21:52:01 EDT Ventricular Rate:  122 PR Interval:    QRS Duration: 69 QT Interval:  299 QTC Calculation: 426 R Axis:   1 Text Interpretation:  Sinus tachycardia Borderline repolarization abnormality Confirmed by Virgel Manifold [phone]) on 11/29/2017 10:50:44 PM   Radiology Ct Angio Chest Pe W And/or Wo Contrast  Result Date: 11/30/2017 CLINICAL DATA:  Sudden onset  severe right flank pain with shortness of breath. EXAM: CT ANGIOGRAPHY CHEST WITH CONTRAST TECHNIQUE: Multidetector CT imaging of the chest was performed using the standard protocol during bolus administration of intravenous contrast. Multiplanar CT image reconstructions and MIPs were obtained to evaluate the vascular anatomy. CONTRAST:  127m ISOVUE-370 IOPAMIDOL (ISOVUE-370) INJECTION 76% COMPARISON:  07/09/2017 FINDINGS: Cardiovascular: Good opacification of the central and segmental pulmonary arteries. No focal filling defects. No evidence of significant pulmonary embolus. Normal caliber thoracic aorta. No aortic dissection. Great vessel origins are patent. Normal heart size. No pericardial effusion. Mediastinum/Nodes: Large esophageal hiatal hernia. Esophagus is decompressed. No significant lymphadenopathy in the chest. Lungs/Pleura: Evaluation is limited due to motion artifact. Azygos lobe. No focal consolidation in the lungs. No pleural effusions. No pneumothorax. Airways are patent. Upper Abdomen: No acute process demonstrated. Musculoskeletal: Degenerative changes in the spine. No destructive bone lesions. Review of the MIP images confirms the above findings. IMPRESSION: 1. No evidence of significant pulmonary embolus. 2. No evidence of active pulmonary disease.  3. Large esophageal hiatal hernia. Electronically Signed   By: WLucienne CapersM.D.   On: 11/30/2017 01:35    Procedures Procedures (including critical care time)  Medications Ordered in ED Medications  0.9 %  sodium chloride infusion ( Intravenous New Bag/Given 11/29/17 2305)     Initial Impression / Assessment and Plan / ED Course  I have reviewed the triage vital signs and the nursing notes.  Pertinent labs & imaging results that were available during my care of the patient were reviewed by me and considered in my medical decision making (see chart for details).     57year old female with vague back pain and dyspnea.  High clinical suspicion for pulmonary embolism.  She has resting tachycardia up to the 140s.  Recent right knee replacement and no longer on prophylactic xarelto.  High pre-test probability. Will CTa.  Abdominal exam is benign.  Final Clinical Impressions(s) / ED Diagnoses   Final diagnoses:  Hiatal hernia    ED Discharge Orders    None      KVirgel Manifold MD 11/30/17 [phone]

## 2017-11-29 NOTE — ED Triage Notes (Signed)
Pt is c/o a sudden onset sharp pain right flank pain. So much so that she states that it is causing her to be short of breath. Remarks on a similar encounter last year that ended with her hospitalization for anemia and blood transfusion secondary ulcerative colitis bleed.

## 2017-11-30 ENCOUNTER — Encounter (HOSPITAL_COMMUNITY): Payer: Self-pay

## 2017-11-30 ENCOUNTER — Encounter: Payer: 59 | Admitting: Physical Therapy

## 2017-11-30 ENCOUNTER — Emergency Department (HOSPITAL_COMMUNITY): Payer: 59

## 2017-11-30 DIAGNOSIS — K449 Diaphragmatic hernia without obstruction or gangrene: Secondary | ICD-10-CM | POA: Diagnosis not present

## 2017-11-30 LAB — BASIC METABOLIC PANEL
Anion gap: 10 (ref 5–15)
BUN: 13 mg/dL (ref 6–20)
CO2: 20 mmol/L — ABNORMAL LOW (ref 22–32)
Calcium: 9.5 mg/dL (ref 8.9–10.3)
Chloride: 110 mmol/L (ref 101–111)
Creatinine, Ser: 0.65 mg/dL (ref 0.44–1.00)
GFR calc Af Amer: >60 mL/min (ref >60)
GFR calc non Af Amer: >60 mL/min (ref >60)
Glucose, Bld: 117 mg/dL — ABNORMAL HIGH (ref 65–99)
Potassium: 3.8 mmol/L (ref 3.5–5.1)
Sodium: 140 mmol/L (ref 135–145)

## 2017-11-30 LAB — I-STAT TROPONIN, ED: Troponin i, poc: 0 ng/mL (ref 0.00–0.08)

## 2017-11-30 LAB — TROPONIN I: Troponin I: <0.03 ng/mL (ref <0.03)

## 2017-11-30 MED ORDER — IOPAMIDOL (ISOVUE-370) INJECTION 76%
100.0000 mL | Freq: Once | INTRAVENOUS | Status: AC | PRN
  Administered 2017-11-30: 100 mL via INTRAVENOUS

## 2017-11-30 MED ORDER — OMEPRAZOLE 20 MG PO CPDR: 20.0000 mg | DELAYED_RELEASE_CAPSULE | Freq: Every day | ORAL | 0 refills | Status: DC

## 2017-11-30 MED ORDER — IOPAMIDOL (ISOVUE-370) INJECTION 76%
INTRAVENOUS | Status: AC
  Administered 2017-11-30: 100 mL via INTRAVENOUS
  Filled 2017-11-30: qty 100

## 2017-11-30 MED ORDER — KETOROLAC TROMETHAMINE 15 MG/ML IJ SOLN
15.0000 mg | Freq: Once | INTRAMUSCULAR | Status: AC
  Administered 2017-11-30: 15 mg via INTRAVENOUS
  Filled 2017-11-30: qty 1

## 2017-11-30 MED ORDER — OXYCODONE HCL 5 MG PO TABS
5.0000 mg | ORAL_TABLET | ORAL | Status: AC
  Administered 2017-11-30: 5 mg via ORAL
  Filled 2017-11-30: qty 1

## 2018-01-06 ENCOUNTER — Other Ambulatory Visit: Payer: Self-pay | Admitting: *Deleted

## 2018-01-06 DIAGNOSIS — Z1231 Encounter for screening mammogram for malignant neoplasm of breast: Secondary | ICD-10-CM

## 2018-01-26 ENCOUNTER — Ambulatory Visit
Admission: RE | Admit: 2018-01-26 | Discharge: 2018-01-26 | Disposition: A | Discharge status: Home / Self Care | Payer: 59 | Source: Ambulatory Visit | Attending: *Deleted | Admitting: *Deleted

## 2018-01-26 DIAGNOSIS — Z1231 Encounter for screening mammogram for malignant neoplasm of breast: Secondary | ICD-10-CM

## 2018-05-12 ENCOUNTER — Telehealth: Payer: Self-pay | Admitting: Gastroenterology

## 2018-05-12 MED ORDER — MESALAMINE ER 0.375 G PO CP24: 1.5000 g | ORAL_CAPSULE | Freq: Every day | ORAL | 3 refills | Status: DC

## 2018-05-12 NOTE — Telephone Encounter (Signed)
Pt needs rf for apriso sent to [facility] on [address].

## 2018-05-12 NOTE — Telephone Encounter (Signed)
apriso sent to pharmacy

## 2018-06-17 ENCOUNTER — Ambulatory Visit: Payer: 59 | Admitting: Gastroenterology

## 2018-07-15 ENCOUNTER — Encounter (INDEPENDENT_AMBULATORY_CARE_PROVIDER_SITE_OTHER): Payer: Self-pay

## 2018-07-15 ENCOUNTER — Ambulatory Visit (INDEPENDENT_AMBULATORY_CARE_PROVIDER_SITE_OTHER): Payer: 59 | Admitting: Gastroenterology

## 2018-07-15 ENCOUNTER — Encounter: Payer: Self-pay | Admitting: Gastroenterology

## 2018-07-15 ENCOUNTER — Other Ambulatory Visit (INDEPENDENT_AMBULATORY_CARE_PROVIDER_SITE_OTHER): Payer: 59

## 2018-07-15 VITALS — BP 124/82 | HR 84 | Ht 60.0 in | Wt 193.0 lb

## 2018-07-15 DIAGNOSIS — R197 Diarrhea, unspecified: Secondary | ICD-10-CM | POA: Diagnosis not present

## 2018-07-15 DIAGNOSIS — K529 Noninfective gastroenteritis and colitis, unspecified: Secondary | ICD-10-CM

## 2018-07-15 DIAGNOSIS — K5100 Ulcerative (chronic) pancolitis without complications: Secondary | ICD-10-CM | POA: Diagnosis not present

## 2018-07-15 LAB — COMPREHENSIVE METABOLIC PANEL
ALK PHOS: 101 U/L (ref 39–117)
ALT: 11 U/L (ref 0–35)
AST: 13 U/L (ref 0–37)
Albumin: 4.4 g/dL (ref 3.5–5.2)
BUN: 13 mg/dL (ref 6–23)
CALCIUM: 9.3 mg/dL (ref 8.4–10.5)
CO2: 32 mEq/L (ref 19–32)
Chloride: 104 mEq/L (ref 96–112)
Creatinine, Ser: 0.74 mg/dL (ref 0.40–1.20)
GFR: 85.65 mL/min (ref >60.00)
Glucose, Bld: 92 mg/dL (ref 70–99)
Potassium: 3.9 mEq/L (ref 3.5–5.1)
Sodium: 140 mEq/L (ref 135–145)
Total Bilirubin: 0.5 mg/dL (ref 0.2–1.2)
Total Protein: 7.2 g/dL (ref 6.0–8.3)

## 2018-07-15 LAB — CBC WITH DIFFERENTIAL/PLATELET
BASOS ABS: 0.1 10*3/uL (ref 0.0–0.1)
BASOS PCT: 2.2 % (ref 0.0–3.0)
EOS ABS: 0.4 10*3/uL (ref 0.0–0.7)
EOS PCT: 7.1 % — AB (ref 0.0–5.0)
HEMATOCRIT: 39.1 % (ref 36.0–46.0)
HEMOGLOBIN: 13.3 g/dL (ref 12.0–15.0)
Lymphocytes Relative: 28.7 % (ref 12.0–46.0)
Lymphs Abs: 1.6 10*3/uL (ref 0.7–4.0)
MCHC: 34 g/dL (ref 30.0–36.0)
MCV: 92 fl (ref 78.0–100.0)
MONO ABS: 0.3 10*3/uL (ref 0.1–1.0)
Monocytes Relative: 6 % (ref 3.0–12.0)
NEUTROS ABS: 3 10*3/uL (ref 1.4–7.7)
NEUTROS PCT: 56 % (ref 43.0–77.0)
PLATELETS: 209 10*3/uL (ref 150.0–400.0)
RBC: 4.25 Mil/uL (ref 3.87–5.11)
RDW: 14.8 % (ref 11.5–15.5)
WBC: 5.4 10*3/uL (ref 4.0–10.5)

## 2018-07-15 LAB — HIGH SENSITIVITY CRP: CRP, High Sensitivity: 1.69 mg/L (ref 0.000–5.000)

## 2018-07-15 NOTE — Progress Notes (Signed)
[name] [name]    [id number]    Jul 25, 1961  Primary Care Physician:Butler, Caren Griffins, DO  Referring Physician: Octavio Graves, [address] [place], [place] [address]  Chief complaint: Ulcerative colitis HPI: 57 year old female with history of pancolitis here for follow-up visit. Colonoscopy August 31, 2017 for colorectal cancer screening with incidental findings suggestive of ulcerative colitis, had erythema and scattered erosions throughout the colon.  Biopsies showed chronic active colitis consistent with inflammatory bowel disease.  She was started on Apriso 1.5 g daily.  She was asymptomatic but in the past few months started having increased bowel frequency with diarrhea almost daily.  She is having 4-5 semi-formed bowel movements with intermittent watery liquid stool.  Denies any blood in stool or blood per rectum.  No nausea, dysphagia, vomiting, loss of appetite, abdominal pain or weight loss. Denies use of NSAIDs.   Outpatient Encounter Medications as of 07/15/2018  Medication Sig  . esomeprazole (NEXIUM) 10 MG packet Take 10 mg by mouth 2 (two) times daily.  . ferrous sulfate 325 (65 FE) MG tablet Take 325 mg by mouth daily with breakfast.  . HYDROcodone-acetaminophen (NORCO) 10-325 MG tablet Take 1 tablet by mouth every 4 (four) hours as needed for moderate pain or severe pain.   . mesalamine (APRISO) 0.375 g 24 hr capsule Take 4 capsules (1.5 g total) by mouth daily. Take for Ulcerative Colitis  . methocarbamol (ROBAXIN) 500 MG tablet Take 1 tablet (500 mg total) by mouth every 6 (six) hours as needed for muscle spasms.  . vitamin E 400 UNIT capsule Take 400 Units by mouth daily.  . [DISCONTINUED] omeprazole (PRILOSEC) 20 MG capsule Take 1 capsule (20 mg total) by mouth daily. (Patient not taking: Reported on 07/15/2018)  . [DISCONTINUED] rivaroxaban (XARELTO) 10 MG TABS tablet Take 1 tablet (10 mg total) by mouth daily with breakfast. Take Xarelto for two  and a half more weeks following discharge from the hospital, then discontinue Xarelto. Once the patient has completed the blood thinner regimen, then take a Baby 81 mg Aspirin daily for three more weeks. (Patient not taking: Reported on 07/15/2018)   No facility-administered encounter medications on file as of 07/15/2018.     Allergies as of 07/15/2018 - Review Complete 07/15/2018  Allergen Reaction Noted  . Acetaminophen Other (See Comments) 08/31/2015  . Penicillins Hives and Other (See Comments) 01/14/2017  . Topamax [topiramate] Other (See Comments) 08/31/2015    Past Medical History:  Diagnosis Date  . Anemia 07/2017   on meds  . Arthritis   . Black-out (not amnesia) 06/2017   black out spells due to anemia!  Marland Kitchen Blood transfusion without reported diagnosis    Pt thinks she had 3 units blood in Dec, 2018  . GERD (gastroesophageal reflux disease)   . History of kidney stones   . Kidney stones   . Migraine   . Restless leg syndrome   . Swelling of lower limb    bialteral lower leg swelling    Past Surgical History:  Procedure Laterality Date  . MULTIPLE TOOTH EXTRACTIONS    . right eye lid surgery     . TOTAL KNEE ARTHROPLASTY Left 01/26/2017   Procedure: LEFT TOTAL KNEE ARTHROPLASTY;  Surgeon: Gaynelle Arabian, MD;  Location: [facility];  Service: Orthopedics;  Laterality: Left;  with block  . TOTAL KNEE ARTHROPLASTY Right 10/26/2017   Procedure: RIGHT TOTAL KNEE ARTHROPLASTY;  Surgeon: Gaynelle Arabian, MD;  Location: [facility];  Service: Orthopedics;  Laterality: Right;  . TUBAL LIGATION      Family History  Problem Relation Age of Onset  . Hypertension Mother   . Diabetes Mellitus II Mother   . Hypertension Father   . Diabetes Mellitus II Father   . Colon polyps Father     Social History   Socioeconomic History  . Marital status: Divorced    Spouse name: Not on file  . Number of children: Not on file  . Years of education: Not on file  . Highest education level: Not  on file  Occupational History  . Not on file  Social Needs  . Financial resource strain: Not on file  . Food insecurity:    Worry: Not on file    Inability: Not on file  . Transportation needs:    Medical: Not on file    Non-medical: Not on file  Tobacco Use  . Smoking status: Never Smoker  . Smokeless tobacco: Never Used  Substance and Sexual Activity  . Alcohol use: No    Frequency: Never  . Drug use: No  . Sexual activity: Yes    Birth control/protection: Surgical  Lifestyle  . Physical activity:    Days per week: Not on file    Minutes per session: Not on file  . Stress: Not on file  Relationships  . Social connections:    Talks on phone: Not on file    Gets together: Not on file    Attends religious service: Not on file    Active member of club or organization: Not on file    Attends meetings of clubs or organizations: Not on file    Relationship status: Not on file  . Intimate partner violence:    Fear of current or ex partner: Not on file    Emotionally abused: Not on file    Physically abused: Not on file    Forced sexual activity: Not on file  Other Topics Concern  . Not on file  Social History Narrative  . Not on file      Review of systems: Review of Systems  Constitutional: Negative for fever and chills.  HENT: Negative.   Eyes: Negative for blurred vision.  Respiratory: Negative for cough, shortness of breath and wheezing.   Cardiovascular: Negative for chest pain and palpitations.  Gastrointestinal: as per HPI Genitourinary: Negative for dysuria, urgency, frequency and hematuria.  Musculoskeletal: Negative for myalgias, back pain and joint pain.  Skin: Negative for itching and rash.  Neurological: Negative for dizziness, tremors, focal weakness, seizures and loss of consciousness.  Endo/Heme/Allergies: Positive for seasonal allergies.  Psychiatric/Behavioral: Negative for depression, suicidal ideas and hallucinations.  All other systems  reviewed and are negative.   Physical Exam: Vitals:   07/15/18 1043  BP: 124/82  Pulse: 84   Body mass index is 37.69 kg/m. 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 Greater than 50% of the time used for counseling as well as treatment plan and follow-up. She had multiple questions which were answered to her satisfaction  K. Denzil Magnuson , MD [phone]    CC: Octavio Graves, DO

## 2018-07-15 NOTE — Patient Instructions (Signed)
Go to the basement for labs today   STOP Apriso   If you are age 265 or older, your body mass index should be between 23-30. Your Body mass index is 37.69 kg/m. If this is out of the aforementioned range listed, please consider follow up with your Primary Care Provider.  If you are age 58 or younger, your body mass index should be between 19-25. Your Body mass index is 37.69 kg/m. If this is out of the aformentioned range listed, please consider follow up with your Primary Care Provider.    Thank you for choosing [facility]  Philbert RiserKavitha Nandigam,MD

## 2018-07-21 ENCOUNTER — Encounter: Payer: Self-pay | Admitting: Gastroenterology

## 2018-07-26 ENCOUNTER — Other Ambulatory Visit: Payer: 59

## 2018-07-26 DIAGNOSIS — R197 Diarrhea, unspecified: Secondary | ICD-10-CM

## 2018-07-26 DIAGNOSIS — K5100 Ulcerative (chronic) pancolitis without complications: Secondary | ICD-10-CM

## 2018-08-02 LAB — GASTROINTESTINAL PATHOGEN PANEL PCR
C. DIFFICILE TOX A/B, PCR: NOT DETECTED
CRYPTOSPORIDIUM, PCR: NOT DETECTED
Campylobacter, PCR: NOT DETECTED
E COLI (STEC) STX1/STX2, PCR: NOT DETECTED
E coli (ETEC) LT/ST PCR: NOT DETECTED
E coli 0157, PCR: NOT DETECTED
Giardia lamblia, PCR: NOT DETECTED
Norovirus, PCR: NOT DETECTED
ROTAVIRUS, PCR: NOT DETECTED
Salmonella, PCR: NOT DETECTED
Shigella, PCR: NOT DETECTED

## 2018-08-18 ENCOUNTER — Ambulatory Visit: Payer: 59 | Admitting: Gastroenterology

## 2018-09-29 ENCOUNTER — Ambulatory Visit (INDEPENDENT_AMBULATORY_CARE_PROVIDER_SITE_OTHER): Payer: 59 | Admitting: Gastroenterology

## 2018-09-29 ENCOUNTER — Encounter: Payer: Self-pay | Admitting: Gastroenterology

## 2018-09-29 VITALS — BP 128/80 | HR 76 | Ht 65.0 in | Wt 191.5 lb

## 2018-09-29 DIAGNOSIS — K5100 Ulcerative (chronic) pancolitis without complications: Secondary | ICD-10-CM

## 2018-09-29 NOTE — Patient Instructions (Signed)
Follow up in 1 year  If you are age 59 or older, your body mass index should be between 23-30. Your Body mass index is 31.87 kg/m. If this is out of the aforementioned range listed, please consider follow up with your Primary Care Provider.  If you are age 35 or younger, your body mass index should be between 19-25. Your Body mass index is 31.87 kg/m. If this is out of the aformentioned range listed, please consider follow up with your Primary Care Provider.    I appreciate the  opportunity to care for you  Thank You   Marsa Aris , MD

## 2018-09-29 NOTE — Progress Notes (Signed)
[name] [name]    [id number]    1961/07/23  Primary Care Physician:Butler, Aram Beecham, DO  Referring Physician: Samuel Jester, DO [address] [place], [place] [address]  Chief complaint:  Ulcerative Colitis  HPI: 58 year old female with history of pan ulcerative colitis for follow-up visit  Colonoscopy August 31, 2017 for colorectal cancer screening with incidental findings suggestive of ulcerative colitis, had erythema and scattered erosions throughout the colon.  Biopsies showed chronic active colitis consistent with inflammatory bowel disease.  Colitis with incidental finding on routine screening colonoscopy.  She was asymptomatic but since starting Apriso she was having diarrhea with watery liquid stools 4-5 times a day.  Discontinued Apriso 2 months ago with improvement of symptoms.  She is having regular formed bowel movements daily, overall feels better Denies any nausea, vomiting, abdominal pain, melena or bright red blood per rectum     Outpatient Encounter Medications as of 09/29/2018  Medication Sig  . esomeprazole (NEXIUM) 10 MG packet Take 10 mg by mouth 2 (two) times daily.  . ferrous sulfate 325 (65 FE) MG tablet Take 325 mg by mouth daily with breakfast.  . gabapentin (NEURONTIN) 400 MG capsule Take 400 mg by mouth 3 (three) times daily.  Marland Kitchen HYDROcodone-acetaminophen (NORCO) 10-325 MG tablet Take 1 tablet by mouth every 4 (four) hours as needed for moderate pain or severe pain.   . vitamin E 400 UNIT capsule Take 400 Units by mouth daily.  . [DISCONTINUED] mesalamine (APRISO) 0.375 g 24 hr capsule Take 4 capsules (1.5 g total) by mouth daily. Take for Ulcerative Colitis  . [DISCONTINUED] methocarbamol (ROBAXIN) 500 MG tablet Take 1 tablet (500 mg total) by mouth every 6 (six) hours as needed for muscle spasms.   No facility-administered encounter medications on file as of 09/29/2018.     Allergies as of 09/29/2018 - Review Complete 09/29/2018    Allergen Reaction Noted  . Acetaminophen Other (See Comments) 08/31/2015  . Penicillins Hives and Other (See Comments) 01/14/2017  . Topamax [topiramate] Other (See Comments) 08/31/2015    Past Medical History:  Diagnosis Date  . Anemia 07/2017   on meds  . Arthritis   . Black-out (not amnesia) 06/2017   black out spells due to anemia!  Marland Kitchen Blood transfusion without reported diagnosis    Pt thinks she had 3 units blood in Dec, 2018  . GERD (gastroesophageal reflux disease)   . History of kidney stones   . Kidney stones   . Migraine   . Restless leg syndrome   . Swelling of lower limb    bialteral lower leg swelling    Past Surgical History:  Procedure Laterality Date  . MULTIPLE TOOTH EXTRACTIONS    . right eye lid surgery     . TOTAL KNEE ARTHROPLASTY Left 01/26/2017   Procedure: LEFT TOTAL KNEE ARTHROPLASTY;  Surgeon: Ollen Gross, MD;  Location: [facility];  Service: Orthopedics;  Laterality: Left;  with block  . TOTAL KNEE ARTHROPLASTY Right 10/26/2017   Procedure: RIGHT TOTAL KNEE ARTHROPLASTY;  Surgeon: Ollen Gross, MD;  Location: [facility];  Service: Orthopedics;  Laterality: Right;  . TUBAL LIGATION      Family History  Problem Relation Age of Onset  . Hypertension Mother   . Diabetes Mellitus II Mother   . Hypertension Father   . Diabetes Mellitus II Father   . Colon polyps Father     Social History   Socioeconomic History  .  Marital status: Divorced    Spouse name: Not on file  . Number of children: Not on file  . Years of education: Not on file  . Highest education level: Not on file  Occupational History  . Not on file  Social Needs  . Financial resource strain: Not on file  . Food insecurity:    Worry: Not on file    Inability: Not on file  . Transportation needs:    Medical: Not on file    Non-medical: Not on file  Tobacco Use  . Smoking status: Never Smoker  . Smokeless tobacco: Never Used  Substance and Sexual Activity  . Alcohol use: No     Frequency: Never  . Drug use: No  . Sexual activity: Yes    Birth control/protection: Surgical  Lifestyle  . Physical activity:    Days per week: Not on file    Minutes per session: Not on file  . Stress: Not on file  Relationships  . Social connections:    Talks on phone: Not on file    Gets together: Not on file    Attends religious service: Not on file    Active member of club or organization: Not on file    Attends meetings of clubs or organizations: Not on file    Relationship status: Not on file  . Intimate partner violence:    Fear of current or ex partner: Not on file    Emotionally abused: Not on file    Physically abused: Not on file    Forced sexual activity: Not on file  Other Topics Concern  . Not on file  Social History Narrative  . Not on file      Review of systems: Review of Systems  Constitutional: Negative for fever and chills.  HENT: Negative.   Eyes: Negative for blurred vision.  Respiratory: Negative for cough, shortness of breath and wheezing.   Cardiovascular: Negative for chest pain and palpitations.  Gastrointestinal: as per HPI Genitourinary: Negative for dysuria, urgency, frequency and hematuria.  Musculoskeletal: Negative for myalgias, back pain and joint pain.  Skin: Negative for itching and rash.  Neurological: Negative for dizziness, tremors, focal weakness, seizures and loss of consciousness.  Endo/Heme/Allergies: Negative for seasonal allergies.  Psychiatric/Behavioral: Negative for depression, suicidal ideas and hallucinations.  All other systems reviewed and are negative.   Physical Exam: Vitals:   09/29/18 0954  BP: 128/80  Pulse: 76   Body mass index is 31.87 kg/m. 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 Did not tolerate Apriso GI pathogen panel negative for infectious etiology. CRP within normal limits December 2019 CBC and CMP within normal limits Currently asymptomatic of maintenance therapy for UC We will continue to monitor off therapy, given lack of symptoms and intolerance of mesalamine Return in  1 year or sooner if needed    15 minutes was spent face-to-face with the patient. Greater than 50% of the time used for counseling as well as treatment plan and follow-up. She had multiple questions which were answered to her satisfaction  K. Scherry Ran , MD [phone]    CC: Samuel Jester, DO

## 2019-02-02 ENCOUNTER — Other Ambulatory Visit: Payer: Self-pay | Admitting: *Deleted

## 2019-02-02 DIAGNOSIS — Z1231 Encounter for screening mammogram for malignant neoplasm of breast: Secondary | ICD-10-CM

## 2019-03-16 ENCOUNTER — Other Ambulatory Visit: Payer: Self-pay

## 2019-03-16 ENCOUNTER — Ambulatory Visit
Admission: RE | Admit: 2019-03-16 | Discharge: 2019-03-16 | Disposition: A | Discharge status: Home / Self Care | Payer: 59 | Source: Ambulatory Visit | Attending: *Deleted | Admitting: *Deleted

## 2019-03-16 DIAGNOSIS — Z1231 Encounter for screening mammogram for malignant neoplasm of breast: Secondary | ICD-10-CM

## 2019-06-14 ENCOUNTER — Emergency Department (HOSPITAL_COMMUNITY): Payer: 59

## 2019-06-14 ENCOUNTER — Emergency Department (HOSPITAL_COMMUNITY)
Admission: EM | Admit: 2019-06-14 | Discharge: 2019-06-14 | Disposition: A | Discharge status: Home / Self Care | Payer: 59 | Attending: Emergency Medicine | Admitting: Emergency Medicine

## 2019-06-14 ENCOUNTER — Other Ambulatory Visit: Payer: Self-pay

## 2019-06-14 ENCOUNTER — Encounter (HOSPITAL_COMMUNITY): Payer: Self-pay | Admitting: Emergency Medicine

## 2019-06-14 DIAGNOSIS — R0602 Shortness of breath: Secondary | ICD-10-CM | POA: Diagnosis not present

## 2019-06-14 DIAGNOSIS — Z79899 Other long term (current) drug therapy: Secondary | ICD-10-CM | POA: Diagnosis not present

## 2019-06-14 DIAGNOSIS — Z20828 Contact with and (suspected) exposure to other viral communicable diseases: Secondary | ICD-10-CM | POA: Insufficient documentation

## 2019-06-14 LAB — CBC WITH DIFFERENTIAL/PLATELET
Abs Immature Granulocytes: 0.01 10*3/uL (ref 0.00–0.07)
Basophils Absolute: 0 10*3/uL (ref 0.0–0.1)
Basophils Relative: 1 %
Eosinophils Absolute: 0.1 10*3/uL (ref 0.0–0.5)
Eosinophils Relative: 1 %
HCT: 25.6 % — ABNORMAL LOW (ref 36.0–46.0)
Hemoglobin: 7.1 g/dL — ABNORMAL LOW (ref 12.0–15.0)
Immature Granulocytes: 0 %
Lymphocytes Relative: 18 %
Lymphs Abs: 1.2 10*3/uL (ref 0.7–4.0)
MCH: 23.7 pg — ABNORMAL LOW (ref 26.0–34.0)
MCHC: 27.7 g/dL — ABNORMAL LOW (ref 30.0–36.0)
MCV: 85.6 fL (ref 80.0–100.0)
Monocytes Absolute: 0.3 10*3/uL (ref 0.1–1.0)
Monocytes Relative: 5 %
Neutro Abs: 5.2 10*3/uL (ref 1.7–7.7)
Neutrophils Relative %: 75 %
Platelets: 260 10*3/uL (ref 150–400)
RBC: 2.99 MIL/uL — ABNORMAL LOW (ref 3.87–5.11)
RDW: 18 % — ABNORMAL HIGH (ref 11.5–15.5)
WBC: 6.8 10*3/uL (ref 4.0–10.5)
nRBC: 0 % (ref 0.0–0.2)

## 2019-06-14 LAB — COMPREHENSIVE METABOLIC PANEL
ALT: 11 U/L (ref 0–44)
AST: 16 U/L (ref 15–41)
Albumin: 3.9 g/dL (ref 3.5–5.0)
Alkaline Phosphatase: 81 U/L (ref 38–126)
Anion gap: 10 (ref 5–15)
BUN: 18 mg/dL (ref 6–20)
CO2: 19 mmol/L — ABNORMAL LOW (ref 22–32)
Calcium: 8.9 mg/dL (ref 8.9–10.3)
Chloride: 110 mmol/L (ref 98–111)
Creatinine, Ser: 0.84 mg/dL (ref 0.44–1.00)
GFR calc Af Amer: >60 mL/min (ref >60)
GFR calc non Af Amer: >60 mL/min (ref >60)
Glucose, Bld: 99 mg/dL (ref 70–99)
Potassium: 3.9 mmol/L (ref 3.5–5.1)
Sodium: 139 mmol/L (ref 135–145)
Total Bilirubin: 0.3 mg/dL (ref 0.3–1.2)
Total Protein: 6.9 g/dL (ref 6.5–8.1)

## 2019-06-14 LAB — BRAIN NATRIURETIC PEPTIDE: B Natriuretic Peptide: 21.9 pg/mL (ref 0.0–100.0)

## 2019-06-14 LAB — TROPONIN I (HIGH SENSITIVITY)
Troponin I (High Sensitivity): <2 ng/L (ref <18)
Troponin I (High Sensitivity): 2 ng/L (ref <18)

## 2019-06-14 LAB — D-DIMER, QUANTITATIVE: D-Dimer, Quant: 0.52 ug/mL-FEU — ABNORMAL HIGH (ref 0.00–0.50)

## 2019-06-14 MED ORDER — ALBUTEROL SULFATE HFA 108 (90 BASE) MCG/ACT IN AERS
2.0000 | INHALATION_SPRAY | Freq: Four times a day (QID) | RESPIRATORY_TRACT | Status: DC
  Administered 2019-06-14: 2 via RESPIRATORY_TRACT
  Filled 2019-06-14: qty 6.7

## 2019-06-14 MED ORDER — SODIUM CHLORIDE (PF) 0.9 % IJ SOLN
INTRAMUSCULAR | Status: AC
  Filled 2019-06-14: qty 50

## 2019-06-14 MED ORDER — PREDNISONE 20 MG PO TABS
60.0000 mg | ORAL_TABLET | Freq: Once | ORAL | Status: AC
  Administered 2019-06-14: 60 mg via ORAL
  Filled 2019-06-14: qty 3

## 2019-06-14 MED ORDER — PREDNISONE 20 MG PO TABS: 40.0000 mg | ORAL_TABLET | Freq: Every day | ORAL | 0 refills | Status: DC

## 2019-06-14 MED ORDER — IOHEXOL 350 MG/ML SOLN
100.0000 mL | Freq: Once | INTRAVENOUS | Status: AC | PRN
  Administered 2019-06-14: 100 mL via INTRAVENOUS

## 2019-06-14 NOTE — ED Notes (Signed)
Pt back from CT

## 2019-06-14 NOTE — ED Triage Notes (Signed)
Patient reports SOB today. Reports worsens on exertion. Denies cough and fever.

## 2019-06-14 NOTE — Discharge Instructions (Signed)
As discussed, your evaluation today has been largely reassuring.  But, it is important that you monitor your condition carefully, and do not hesitate to return to the ED if you develop new, or concerning changes in your condition. ? ?Otherwise, please follow-up with your physician for appropriate ongoing care. ? ?

## 2019-06-14 NOTE — ED Provider Notes (Signed)
[facility] DEPT Provider Note   CSN: [id number] Arrival date & time: 06/14/19  1635     History   Chief Complaint Chief Complaint  Patient presents with   Shortness of Breath    HPI [name] [name] is a 58 y.o. female.     HPI  Patient presents with concern of dyspnea and fatigue. Onset was today, without clear precipitant. She notes that today, whereas she typically can perform ADL, and walk, ascending stairs without difficulty she has had persistent dyspnea, worse with exertion. No chest pain worse with exertion, nor at rest. No fever, no cough. Patient has no history of smoking, asthma, bronchitis. 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   Past Medical History:  Diagnosis Date   Anemia 07/2017   on meds   Arthritis    Black-out (not amnesia) 06/2017   black out spells due to anemia!   Blood transfusion without reported diagnosis    Pt thinks she had 3 units blood in Dec, 2018   GERD (gastroesophageal reflux disease)    History of kidney stones    Kidney stones    Migraine    Restless leg syndrome    Swelling of lower limb    bialteral lower leg swelling    Patient Active Problem List   Diagnosis Date Noted   Iron deficiency anemia due to chronic blood loss    Symptomatic anemia 07/09/2017   SIRS (systemic inflammatory response syndrome) ([place]) 07/09/2017   Hypokalemia 07/09/2017   Bronchitis 07/09/2017   Arthritis 07/09/2017   URI (upper respiratory infection) 07/09/2017   OA (osteoarthritis) of knee 01/26/2017   Nephrolithiasis 11/15/2013   Chest pain 10/22/2012   Migraine 10/22/2012   Numbness and tingling in hands 10/22/2012    Past Surgical History:  Procedure Laterality Date   MULTIPLE TOOTH EXTRACTIONS     right eye lid surgery      TOTAL KNEE ARTHROPLASTY Left 01/26/2017   Procedure: LEFT TOTAL KNEE ARTHROPLASTY;   Surgeon: Gaynelle Arabian, MD;  Location: [facility];  Service: Orthopedics;  Laterality: Left;  with block   TOTAL KNEE ARTHROPLASTY Right 10/26/2017   Procedure: RIGHT TOTAL KNEE ARTHROPLASTY;  Surgeon: Gaynelle Arabian, MD;  Location: [facility];  Service: Orthopedics;  Laterality: Right;   TUBAL LIGATION       OB History   No obstetric history on file.      Home Medications    Prior to Admission medications   Medication Sig Start Date End Date Taking? Authorizing Provider  cetirizine (ZYRTEC) 10 MG tablet Take 10 mg by mouth at bedtime.   Yes [provider]  esomeprazole (NEXIUM) 10 MG packet Take 10 mg by mouth 2 (two) times daily.   Yes [provider]  ferrous sulfate 325 (65 FE) MG tablet Take 162.5 mg by mouth daily with breakfast.    Yes [provider]  gabapentin (NEURONTIN) 400 MG capsule Take 800 mg by mouth at bedtime.    Yes [provider]  traZODone (DESYREL) 100 MG tablet Take 50 mg by mouth at bedtime. 05/06/19  Yes [provider]  vitamin E 400 UNIT capsule Take 400 Units by mouth daily.   Yes [provider]  predniSONE (DELTASONE) 20 MG tablet Take 2 tablets (40 mg total) by mouth daily with breakfast. For the next four days 06/14/19   Carmin Muskrat, MD    Family History Family  History  Problem Relation Age of Onset   Hypertension Mother    Diabetes Mellitus II Mother    Hypertension Father    Diabetes Mellitus II Father    Colon polyps Father     Social History Social History   Tobacco Use   Smoking status: Never Smoker   Smokeless tobacco: Never Used  Substance Use Topics   Alcohol use: No    Frequency: Never   Drug use: No     Allergies   Acetaminophen, Penicillins, and Topamax [topiramate]   Review of Systems Review of Systems  Constitutional:       Per HPI, otherwise negative  HENT:       Per HPI, otherwise negative  Respiratory:       Per HPI, otherwise negative    Cardiovascular:       Per HPI, otherwise negative  Gastrointestinal: Negative for vomiting.  Endocrine:       Negative aside from HPI  Genitourinary:       Neg aside from HPI   Musculoskeletal:       Per HPI, otherwise negative  Skin: Negative.   Neurological: Positive for weakness. Negative for syncope.     Physical Exam Updated Vital Signs BP 118/81    Pulse 80    Temp 98.9 F (37.2 C) (Oral)    Resp (!) 21    LMP 06/24/2012    SpO2 98%   Physical Exam Vitals signs and nursing note reviewed.  Constitutional:      General: She is not in acute distress.    Appearance: She is well-developed.  HENT:     Head: Normocephalic and atraumatic.  Eyes:     Conjunctiva/sclera: Conjunctivae normal.  Cardiovascular:     Rate and Rhythm: Normal rate and regular rhythm.  Pulmonary:     Effort: Pulmonary effort is normal. No respiratory distress.     Breath sounds: Normal breath sounds. No stridor.  Abdominal:     General: There is no distension.  Skin:    General: Skin is warm and dry.  Neurological:     Mental Status: She is alert and oriented to person, place, and time.     Cranial Nerves: No cranial nerve deficit.      ED Treatments / Results  Labs (all labs ordered are listed, but only abnormal results are displayed) Labs Reviewed  COMPREHENSIVE METABOLIC PANEL - Abnormal; Notable for the following components:      Result Value   CO2 19 (*)    All other components within normal limits  CBC WITH DIFFERENTIAL/PLATELET - Abnormal; Notable for the following components:   RBC 2.99 (*)    Hemoglobin 7.1 (*)    HCT 25.6 (*)    MCH 23.7 (*)    MCHC 27.7 (*)    RDW 18.0 (*)    All other components within normal limits  D-DIMER, QUANTITATIVE (NOT AT [facility]) - Abnormal; Notable for the following components:   D-Dimer, Quant 0.52 (*)    All other components within normal limits  SARS CORONAVIRUS 2 (TAT 6-24 HRS)  BRAIN NATRIURETIC PEPTIDE  URINALYSIS, ROUTINE W REFLEX  MICROSCOPIC  TROPONIN I (HIGH SENSITIVITY)  TROPONIN I (HIGH SENSITIVITY)    EKG EKG Interpretation  Date/Time:  Tuesday June 14 2019 16:55:51 EST Ventricular Rate:  101 PR Interval:    QRS Duration: 94 QT Interval:  317 QTC Calculation: 411 R Axis:   12 Text Interpretation: Sinus tachycardia Low voltage, precordial leads Minimal ST depression,  anterolateral leads Baseline wander in lead(s) V3 No significant change since last tracing Confirmed by Dorie Rank [phone]) on 06/14/2019 5:04:30 PM   Radiology Dg Chest 2 View  Result Date: 06/14/2019 CLINICAL DATA:  Dyspnea EXAM: CHEST - 2 VIEW COMPARISON:  07/09/2017 chest radiograph. FINDINGS: Stable cardiomediastinal silhouette with normal heart size and moderate hiatal hernia. No pneumothorax. No pleural effusion. Lungs appear clear, with no acute consolidative airspace disease and no pulmonary edema. IMPRESSION: No active cardiopulmonary disease.  Stable moderate hiatal hernia. Electronically Signed   By: Ilona Sorrel M.D.   On: 06/14/2019 17:14   Ct Angio Chest Pe W And/or Wo Contrast  Result Date: 06/14/2019 CLINICAL DATA:  Chest pain and shortness of breath EXAM: CT ANGIOGRAPHY CHEST WITH CONTRAST TECHNIQUE: Multidetector CT imaging of the chest was performed using the standard protocol during bolus administration of intravenous contrast. Multiplanar CT image reconstructions and MIPs were obtained to evaluate the vascular anatomy. CONTRAST:  151m OMNIPAQUE IOHEXOL 350 MG/ML SOLN COMPARISON:  11/30/2017 FINDINGS: Cardiovascular: Thoracic aorta demonstrates a normal branching pattern. No aneurysmal dilatation is noted. No cardiac enlargement is seen. Pulmonary artery is somewhat limited by patient motion artifact although no filling defects to suggest pulmonary emboli are noted. Mediastinum/Nodes: Hypodense lesions are noted within the right lobe of the thyroid stable from the prior exam. No hilar or mediastinal adenopathy is noted.  Large hiatal hernia is noted stable from the prior exam. Lungs/Pleura: Lungs are well aerated bilaterally. Azygos lobe is seen. No focal infiltrate or sizable effusion is noted. No parenchymal nodule is seen. Upper Abdomen: No acute abnormality. Musculoskeletal: Degenerative changes of the thoracic spine are noted. No acute bony abnormality is seen. Review of the MIP images confirms the above findings. IMPRESSION: Mild motion artifact limitations although no pulmonary emboli are seen. Hypodense lesions within the right lobe of the thyroid stable from the prior exam as well as a prior CT from 2018. These are felt to be benign in etiology given their long-term stability. Electronically Signed   By: MInez CatalinaM.D.   On: 06/14/2019 22:16    Procedures Procedures (including critical care time)  Medications Ordered in ED Medications  sodium chloride (PF) 0.9 % injection (has no administration in time range)  albuterol (VENTOLIN HFA) 108 (90 Base) MCG/ACT inhaler 2 puff (2 puffs Inhalation Provided for home use 06/14/19 2257)  iohexol (OMNIPAQUE) 350 MG/ML injection 100 mL (100 mLs Intravenous Contrast Given 06/14/19 2156)  predniSONE (DELTASONE) tablet 60 mg (60 mg Oral Given 06/14/19 2257)     Initial Impression / Assessment and Plan / ED Course  I have reviewed the triage vital signs and the nursing notes.  Pertinent labs & imaging results that were available during my care of the patient were reviewed by me and considered in my medical decision making (see chart for details).    D-dimer positive, initial troponin negative  Second troponin negative as well. She continues to complain of dyspnea.    10:59 PM Patient in no distress, hemodynamically unremarkable sitting upright reading a book. Very lengthy conversation about all findings including 2 normal troponin, nonischemic EKG, CT without PE, pneumonia or other notable findings. With concern for dyspnea, some suspicion for bronchitis  versus Covid, absent hemodynamic instability, patient is appropriate for close outpatient follow-up. Steroids, albuterol started, patient discharged in stable condition.  [name] S LVentresswas evaluated in Emergency Department on 06/14/2019 for the symptoms described in the history of present illness. She was evaluated in the context of  the global COVID-19 pandemic, which necessitated consideration that the patient might be at risk for infection with the SARS-CoV-2 virus that causes COVID-19. Institutional protocols and algorithms that pertain to the evaluation of patients at risk for COVID-19 are in a state of rapid change based on information released by regulatory bodies including the CDC and federal and state organizations. These policies and algorithms were followed during the patient's care in the ED.   Final Clinical Impressions(s) / ED Diagnoses   Final diagnoses:  Shortness of breath    ED Discharge Orders         Ordered    predniSONE (DELTASONE) 20 MG tablet  Daily with breakfast     06/14/19 2251           Carmin Muskrat, MD 06/14/19 2300

## 2019-06-14 NOTE — ED Notes (Signed)
Patient able to speak on her cell phone and speak to this RN while O2 showing 99% RA.

## 2019-06-15 LAB — SARS CORONAVIRUS 2 (TAT 6-24 HRS): SARS Coronavirus 2: NEGATIVE

## 2019-06-17 ENCOUNTER — Telehealth: Payer: Self-pay | Admitting: General Practice

## 2019-06-17 NOTE — Telephone Encounter (Signed)
Negative COVID results given. Patient results "NOT Detected." Caller expressed understanding. ° °

## 2019-10-18 ENCOUNTER — Telehealth: Payer: Self-pay | Admitting: Gastroenterology

## 2019-10-18 NOTE — Telephone Encounter (Signed)
Spoke to the patient who recently started a new medication (metoprolol). She has been having black stools for a few days, stopped the medication and has continue to have black stools. Unsure if this is a side effect of medication. Its been over a year since she has been seen by GI. Appt scheduled with PG tomorrow at 2:00 pm.

## 2019-10-19 ENCOUNTER — Ambulatory Visit (INDEPENDENT_AMBULATORY_CARE_PROVIDER_SITE_OTHER): Payer: Medicare Other | Admitting: Nurse Practitioner

## 2019-10-19 ENCOUNTER — Other Ambulatory Visit (INDEPENDENT_AMBULATORY_CARE_PROVIDER_SITE_OTHER): Payer: Medicare Other

## 2019-10-19 ENCOUNTER — Encounter: Payer: Self-pay | Admitting: Nurse Practitioner

## 2019-10-19 VITALS — BP 130/74 | HR 80 | Temp 98.6°F | Ht 63.75 in | Wt 177.1 lb

## 2019-10-19 DIAGNOSIS — K5100 Ulcerative (chronic) pancolitis without complications: Secondary | ICD-10-CM | POA: Diagnosis not present

## 2019-10-19 DIAGNOSIS — K921 Melena: Secondary | ICD-10-CM

## 2019-10-19 LAB — BASIC METABOLIC PANEL
BUN: 6 mg/dL (ref 6–23)
CO2: 29 mEq/L (ref 19–32)
Calcium: 9.3 mg/dL (ref 8.4–10.5)
Chloride: 104 mEq/L (ref 96–112)
Creatinine, Ser: 0.79 mg/dL (ref 0.40–1.20)
GFR: 74.41 mL/min (ref >60.00)
Glucose, Bld: 99 mg/dL (ref 70–99)
Potassium: 3.8 mEq/L (ref 3.5–5.1)
Sodium: 141 mEq/L (ref 135–145)

## 2019-10-19 LAB — CBC
HCT: 27.1 % — ABNORMAL LOW (ref 36.0–46.0)
Hemoglobin: 8.4 g/dL — ABNORMAL LOW (ref 12.0–15.0)
MCHC: 30.8 g/dL (ref 30.0–36.0)
MCV: 77.2 fl — ABNORMAL LOW (ref 78.0–100.0)
Platelets: 277 10*3/uL (ref 150.0–400.0)
RBC: 3.51 Mil/uL — ABNORMAL LOW (ref 3.87–5.11)
RDW: 23.5 % — ABNORMAL HIGH (ref 11.5–15.5)
WBC: 4.5 10*3/uL (ref 4.0–10.5)

## 2019-10-19 NOTE — Progress Notes (Signed)
IMPRESSION and PLAN:    [name] [name] is a 59 y.o. female with a pmh significant for, not necessarily limited to arthritis, restless leg syndrome, history of kidney stones, GERD, pan ulcerative colitis  # Black stools / dark emesis ( last weekend) --No stool or residual in anal vault.  Gloved finger heme-negative --She feels okay.  Will check CBC, BMP get back with her right results.  Depending on results and clinical course she may need EGD --Continue PPI --She does not take NSAIDs  # Pan Ulcerative Colitis -Incidental finding on screening colonoscopy in 2019 -She has been asymptomatic off treatment for nearly 2 years  HPI:    Primary GI: Dr. Silverio Decamp  Chief complaint : black stool  **History comes from the chart and patient [name] [name] is a 59 year old female who was found on screening colonoscopy January 2019 to have findings of ulcerative colitis.  This was an incidental finding and she was asymptomatic.  Biopsies compatible with chronic, active colitis consistent with IBD.  She was started on mesalamine but discontinued it with improvement in symptoms.  At her follow-up visit February 2020 she was having formed bowel movements and feeling well off treatment.  Plan was just to monitor her off treatment and return for follow-up in 1 year.  She called the office yesterday with complaints of black stool for 7 days after starting metoprolol.  Prior to that her stools were normal. She takes iron every 3 days but it doesn't generally turn stools black.  No bismuth use. She vomited black emesis over weekend. Stools on loose side over the last few days. She had abdominal pain a few days ago but points to the lower abdomen. No NSAID use.  She feels okay.  After starting Lopressor she has had a little bit of lightheadedness   Review of systems:     No chest pain, no SOB, no fevers, no urinary sx   Past Medical History:  Diagnosis Date  . Anemia 07/2017   on meds  . Arthritis    . Black-out (not amnesia) 06/2017   black out spells due to anemia!  Marland Kitchen Blood transfusion without reported diagnosis    Pt thinks she had 3 units blood in Dec, 2018  . GERD (gastroesophageal reflux disease)   . History of kidney stones   . Kidney stones   . Migraine   . Restless leg syndrome   . Swelling of lower limb    bialteral lower leg swelling    Patient's surgical history, family medical history, social history, medications and allergies were all reviewed in Epic   Creatinine clearance cannot be calculated (Patient's most recent lab result is older than the maximum 21 days allowed.)  Current Outpatient Medications  Medication Sig Dispense Refill  . cetirizine (ZYRTEC) 10 MG tablet Take 10 mg by mouth at bedtime.    Marland Kitchen esomeprazole (NEXIUM) 10 MG packet Take 10 mg by mouth 2 (two) times daily.    Marland Kitchen estradiol (ESTRACE) 0.1 MG/GM vaginal cream Place 1 Applicatorful vaginally as needed.    . ferrous sulfate 325 (65 FE) MG tablet Take 162.5 mg by mouth. Every 3 days    . furosemide (LASIX) 20 MG tablet Take 40 mg by mouth as needed.     . gabapentin (NEURONTIN) 400 MG capsule Take 800 mg by mouth at bedtime.     . metoprolol tartrate (LOPRESSOR) 25 MG tablet Take 25 mg by mouth 2 (two)  times daily.    Marland Kitchen PROAIR RESPICLICK 173 (90 Base) MCG/ACT AEPB Inhale 1 puff into the lungs every 4 (four) hours as needed.    . traZODone (DESYREL) 100 MG tablet Take 50 mg by mouth at bedtime.    . vitamin E 400 UNIT capsule Take 400 Units by mouth daily.     No current facility-administered medications for this visit.    Physical Exam:     BP 130/74 (BP Location: Left Arm, Patient Position: Sitting, Cuff Size: Normal)   Pulse 80   Temp 98.6 F (37 C)   Ht 5' 3.75" (1.619 m)   Wt 177 lb 2 oz (80.3 kg)   LMP 06/24/2012   BMI 30.64 kg/m   GENERAL:  Pleasant female in NAD PSYCH: : Cooperative, normal affect CARDIAC:  RRR,  no peripheral edema PULM: Normal respiratory effort, lungs CTA  bilaterally, no wheezing ABDOMEN:  Nondistended, soft, nontender. No obvious masses, no hepatomegaly,  normal bowel sounds RECTAL:  No stool in vault. Gloved finger heme negative.  SKIN:  turgor, no lesions seen Musculoskeletal:  Normal muscle tone, normal strength NEURO: Alert and oriented x 3, no focal neurologic deficits   Tye Savoy , NP 10/19/2019, 2:15 PM

## 2019-10-19 NOTE — Patient Instructions (Signed)
If you are age 60 or older, your body mass index should be between 23-30. Your Body mass index is 30.64 kg/m. If this is out of the aforementioned range listed, please consider follow up with your Primary Care Provider.  If you are age 40 or younger, your body mass index should be between 19-25. Your Body mass index is 30.64 kg/m. If this is out of the aformentioned range listed, please consider follow up with your Primary Care Provider.    Your provider has requested that you go to the basement level for lab work before leaving today. Press "B" on the elevator. The lab is located at the first door on the left as you exit the elevator.  Due to recent changes in healthcare laws, you may see the results of your imaging and laboratory studies on MyChart before your provider has had a chance to review them.  We understand that in some cases there may be results that are confusing or concerning to you. Not all laboratory results come back in the same time frame and the provider may be waiting for multiple results in order to interpret others.  Please give [place] 48 hours in order for your provider to thoroughly review all the results before contacting the office for clarification of your results.   Thank you for choosing me and [facility].  Druscilla Brownie

## 2019-10-20 ENCOUNTER — Telehealth: Payer: Self-pay | Admitting: Nurse Practitioner

## 2019-10-20 NOTE — Telephone Encounter (Signed)
Patient is requesting lab results

## 2019-10-20 NOTE — Telephone Encounter (Signed)
Patient advised the recommendations are not decided yet. Hgb is stable per PG.

## 2019-10-21 ENCOUNTER — Telehealth: Payer: Self-pay | Admitting: Nurse Practitioner

## 2019-10-21 NOTE — Telephone Encounter (Signed)
Pharmacy changed to [facility].

## 2019-10-28 ENCOUNTER — Telehealth: Payer: Self-pay | Admitting: Nurse Practitioner

## 2019-10-28 NOTE — Telephone Encounter (Signed)
Patient has decided she does want to have the EGD/colon. She asks I schedule her and call her on Monday. She is driving and unable to write down the information. Pre-visit 11/03/19 at 8:30 am COVID 11/17/19 at 3:10 pmLEC 11/21/19 at  3 pm

## 2019-10-28 NOTE — Progress Notes (Signed)
Reviewed and agree with documentation and assessment and plan. K. Veena Nandigam , MD   

## 2019-10-28 NOTE — Telephone Encounter (Signed)
Pt stated that she is returning your call.

## 2019-10-31 NOTE — Telephone Encounter (Signed)
Spoke with the patient. She agrees to these dates and times.

## 2019-11-03 ENCOUNTER — Ambulatory Visit (AMBULATORY_SURGERY_CENTER): Payer: Self-pay | Admitting: *Deleted

## 2019-11-03 ENCOUNTER — Other Ambulatory Visit: Payer: Self-pay

## 2019-11-03 VITALS — Temp 96.8°F | Ht 65.0 in | Wt 178.0 lb

## 2019-11-03 DIAGNOSIS — K921 Melena: Secondary | ICD-10-CM

## 2019-11-03 DIAGNOSIS — Z01818 Encounter for other preprocedural examination: Secondary | ICD-10-CM

## 2019-11-03 DIAGNOSIS — K5100 Ulcerative (chronic) pancolitis without complications: Secondary | ICD-10-CM

## 2019-11-03 MED ORDER — SUPREP BOWEL PREP KIT 17.5-3.13-1.6 GM/177ML PO SOLN: ORAL | 0 refills | Status: DC

## 2019-11-03 NOTE — Progress Notes (Signed)
covid test 11-17-19 at 3:10 pm  Pt is aware that care partner will wait in the car during procedure; if they feel like they will be too hot or cold to wait in the car; they may wait in the 4 th floor lobby. Patient is aware to bring only one care partner. We want them to wear a mask (we do not have any that we can provide them), practice social distancing, and we will check their temperatures when they get here.  I did remind the patient that their care partner needs to stay in the parking lot the entire time and have a cell phone available, we will call them when the pt is ready for discharge. Patient will wear mask into building.   No trouble with anesthesia, difficulty with intubation or hx/fam hx of malignant hyperthermia per pt   No egg or soy allergy  No home oxygen use   No medications for weight loss taken  emmi information given

## 2019-11-17 ENCOUNTER — Other Ambulatory Visit: Payer: Self-pay | Admitting: Gastroenterology

## 2019-11-17 ENCOUNTER — Ambulatory Visit (INDEPENDENT_AMBULATORY_CARE_PROVIDER_SITE_OTHER): Payer: Medicare Other

## 2019-11-17 DIAGNOSIS — Z1159 Encounter for screening for other viral diseases: Secondary | ICD-10-CM

## 2019-11-18 LAB — SARS CORONAVIRUS 2 (TAT 6-24 HRS): SARS Coronavirus 2: NEGATIVE

## 2019-11-21 ENCOUNTER — Other Ambulatory Visit: Payer: Self-pay

## 2019-11-21 ENCOUNTER — Ambulatory Visit (AMBULATORY_SURGERY_CENTER): Payer: Medicare Other | Admitting: Gastroenterology

## 2019-11-21 ENCOUNTER — Other Ambulatory Visit (INDEPENDENT_AMBULATORY_CARE_PROVIDER_SITE_OTHER): Payer: Medicare Other

## 2019-11-21 ENCOUNTER — Encounter: Payer: Self-pay | Admitting: Gastroenterology

## 2019-11-21 VITALS — BP 122/61 | HR 62 | Temp 97.3°F | Resp 14 | Ht 65.0 in | Wt 178.0 lb

## 2019-11-21 DIAGNOSIS — D500 Iron deficiency anemia secondary to blood loss (chronic): Secondary | ICD-10-CM

## 2019-11-21 DIAGNOSIS — K921 Melena: Secondary | ICD-10-CM | POA: Diagnosis not present

## 2019-11-21 DIAGNOSIS — K449 Diaphragmatic hernia without obstruction or gangrene: Secondary | ICD-10-CM | POA: Diagnosis not present

## 2019-11-21 DIAGNOSIS — K5100 Ulcerative (chronic) pancolitis without complications: Secondary | ICD-10-CM

## 2019-11-21 LAB — CBC WITH DIFFERENTIAL/PLATELET
Basophils Absolute: 0 10*3/uL (ref 0.0–0.1)
Basophils Relative: 1.1 % (ref 0.0–3.0)
Eosinophils Absolute: 0.2 10*3/uL (ref 0.0–0.7)
Eosinophils Relative: 5.4 % — ABNORMAL HIGH (ref 0.0–5.0)
HCT: 27.5 % — ABNORMAL LOW (ref 36.0–46.0)
Hemoglobin: 8.5 g/dL — ABNORMAL LOW (ref 12.0–15.0)
Lymphocytes Relative: 20.8 % (ref 12.0–46.0)
Lymphs Abs: 0.8 10*3/uL (ref 0.7–4.0)
MCHC: 31.1 g/dL (ref 30.0–36.0)
MCV: 74.7 fl — ABNORMAL LOW (ref 78.0–100.0)
Monocytes Absolute: 0.3 10*3/uL (ref 0.1–1.0)
Monocytes Relative: 7.4 % (ref 3.0–12.0)
Neutro Abs: 2.5 10*3/uL (ref 1.4–7.7)
Neutrophils Relative %: 65.3 % (ref 43.0–77.0)
Platelets: 248 10*3/uL (ref 150.0–400.0)
RBC: 3.68 Mil/uL — ABNORMAL LOW (ref 3.87–5.11)
RDW: 20 % — ABNORMAL HIGH (ref 11.5–15.5)
WBC: 3.8 10*3/uL — ABNORMAL LOW (ref 4.0–10.5)

## 2019-11-21 LAB — IBC + FERRITIN
Ferritin: 6.1 ng/mL — ABNORMAL LOW (ref 10.0–291.0)
Iron: 9 ug/dL — ABNORMAL LOW (ref 42–145)
Saturation Ratios: 2.1 % — ABNORMAL LOW (ref 20.0–50.0)
Transferrin: 313 mg/dL (ref 212.0–360.0)

## 2019-11-21 MED ORDER — SODIUM CHLORIDE 0.9 % IV SOLN: 500.0000 mL | Freq: Once | INTRAVENOUS | Status: DC

## 2019-11-21 NOTE — Op Note (Signed)
[facility] Patient Name: [name] [name] Procedure Date: 11/21/2019 10:12 AM MRN: [id number] Endoscopist: Mauri Pole , MD Age: 59 Referring MD:  Date of Birth: 07/03/1961 Gender: Female Account #: [id number] Procedure:                Colonoscopy Indications:              Evaluation of unexplained GI bleeding presenting                            with Melena (upper GI source has been excluded),                            Unexplained iron deficiency anemia, Obtain more                            precise diagnosis of inflammatory bowel disease Medicines:                Monitored Anesthesia Care Procedure:                Pre-Anesthesia Assessment:                           - Prior to the procedure, a History and Physical                            was performed, and patient medications and                            allergies were reviewed. The patient's tolerance of                            previous anesthesia was also reviewed. The risks                            and benefits of the procedure and the sedation                            options and risks were discussed with the patient.                            All questions were answered, and informed consent                            was obtained. Prior Anticoagulants: The patient has                            taken no previous anticoagulant or antiplatelet                            agents. ASA Grade Assessment: II - A patient with                            mild systemic disease. After reviewing the risks  and benefits, the patient was deemed in                            satisfactory condition to undergo the procedure.                           After obtaining informed consent, the colonoscope                            was passed under direct vision. Throughout the                            procedure, the patient's blood pressure, pulse, and                            oxygen  saturations were monitored continuously. The                            Colonoscope was introduced through the anus and                            advanced to the the terminal ileum, with                            identification of the appendiceal orifice and IC                            valve. The colonoscopy was performed without                            difficulty. The patient tolerated the procedure                            well. The quality of the bowel preparation was                            adequate. The ileocecal valve, appendiceal orifice,                            and rectum were photographed. Scope In: 10:26:38 AM Scope Out: 10:47:46 AM Scope Withdrawal Time: 0 hours 13 minutes 43 seconds  Total Procedure Duration: 0 hours 21 minutes 8 seconds  Findings:                 The perianal and digital rectal examinations were                            normal.                           Normal mucosa was found in the entire colon.                            Biopsies were taken with a cold forceps for  histology.                           The terminal ileum appeared normal. Complications:            No immediate complications. Estimated Blood Loss:     Estimated blood loss was minimal. Impression:               - Normal mucosa in the entire examined colon.                            Biopsied.                           - The examined portion of the ileum was normal. Recommendation:           - Patient has a contact number available for                            emergencies. The signs and symptoms of potential                            delayed complications were discussed with the                            patient. Return to normal activities tomorrow.                            Written discharge instructions were provided to the                            patient.                           - Resume previous diet.                           -  Continue present medications.                           - Await pathology results.                           - No aspirin, ibuprofen, naproxen, or other                            non-steroidal anti-inflammatory drugs.                           - Check CBC and iron panel with ferritin                           - Schedule small bowel video capsule study to                            further investigate iron deficiency anemia                           -  Return to GI office at the next available                            appointment after video capsule for follow up of                            anemia. Please call [phone] to schedule office                            visit. Mauri Pole, MD 11/21/2019 11:01:06 AM This report has been signed electronically.

## 2019-11-21 NOTE — Progress Notes (Signed)
A and O x3. Report to RN. Tolerated MAC anesthesia well.

## 2019-11-21 NOTE — Progress Notes (Signed)
Called to room to assist during endoscopic procedure.  Patient ID and intended procedure confirmed with present staff. Received instructions for my participation in the procedure from the performing physician.  

## 2019-11-21 NOTE — Patient Instructions (Signed)
YOU HAD AN ENDOSCOPIC PROCEDURE TODAY AT [facility]:   Refer to the procedure report that was given to you for any specific questions about what was found during the examination.  If the procedure report does not answer your questions, please call your gastroenterologist to clarify.  If you requested that your care partner not be given the details of your procedure findings, then the procedure report has been included in a sealed envelope for you to review at your convenience later.  YOU SHOULD EXPECT: Some feelings of bloating in the abdomen. Passage of more gas than usual.  Walking can help get rid of the air that was put into your GI tract during the procedure and reduce the bloating. If you had a lower endoscopy (such as a colonoscopy or flexible sigmoidoscopy) you may notice spotting of blood in your stool or on the toilet paper. If you underwent a bowel prep for your procedure, you may not have a normal bowel movement for a few days.  Please Note:  You might notice some irritation and congestion in your nose or some drainage.  This is from the oxygen used during your procedure.  There is no need for concern and it should clear up in a day or so.  SYMPTOMS TO REPORT IMMEDIATELY:   Following lower endoscopy (colonoscopy or flexible sigmoidoscopy):  Excessive amounts of blood in the stool  Significant tenderness or worsening of abdominal pains  Swelling of the abdomen that is new, acute  Fever of 100F or higher   Following upper endoscopy (EGD)  Vomiting of blood or coffee ground material  New chest pain or pain under the shoulder blades  Painful or persistently difficult swallowing  New shortness of breath  Fever of 100F or higher  Black, tarry-looking stools  For urgent or emergent issues, a gastroenterologist can be reached at any hour by calling [phone]. Do not use MyChart messaging for urgent concerns.    DIET:  We do recommend a small meal at first, but  then you may proceed to your regular diet.  Drink plenty of fluids but you should avoid alcoholic beverages for 24 hours.  MEDICATIONS: Continue present medications, No Aspirin, Ibuprofen, Naproxen, or other non-steroidal anti-inflammatory drugs.  Have labs drawn for CBC and Iron Panel with Ferritin prior to discharge today.  FOLLOW UP: Schedule a small bowel video capsule study to further investigate iron deficiency anemia. Dr. Woodward Ku office nurse will call you to schedule this appointment. Return to see Dr. Silverio Decamp in her office at the next available appointment after the video capsule for follow up of anemia. Please call [phone] to schedule office visit.  Please see handouts given to you by your recovery nurse.  ACTIVITY:  You should plan to take it easy for the rest of today and you should NOT DRIVE or use heavy machinery until tomorrow (because of the sedation medicines used during the test).    FOLLOW UP: Our staff will call the number listed on your records 48-72 hours following your procedure to check on you and address any questions or concerns that you may have regarding the information given to you following your procedure. If we do not reach you, we will leave a message.  We will attempt to reach you two times.  During this call, we will ask if you have developed any symptoms of COVID 19. If you develop any symptoms (ie: fever, flu-like symptoms, shortness of breath, cough etc.) before then, please call [phone].  If you test positive for Covid 19 in the 2 weeks post procedure, please call and report this information to [place].    If any biopsies were taken you will be contacted by phone or by letter within the next 1-3 weeks.  Please call us at [phone] if you have not heard about the biopsies in 3 weeks.   Thank you for allowing [place] to provide for your healthcare needs today.  SIGNATURES/CONFIDENTIALITY: You and/or your care partner have signed paperwork which will  be entered into your electronic medical record.  These signatures attest to the fact that that the information above on your After Visit Summary has been reviewed and is understood.  Full responsibility of the confidentiality of this discharge information lies with you and/or your care-partner.

## 2019-11-21 NOTE — Op Note (Signed)
[facility] Patient Name: [name] [name] Procedure Date: 11/21/2019 10:13 AM MRN: [id number] Endoscopist: Napoleon Form , MD Age: 59 Referring MD:  Date of Birth: 09/15/1960 Gender: Female Account #: [id number] Procedure:                Upper GI endoscopy Indications:              Recent gastrointestinal bleeding, h/o dark stool,                            ?melena. Suspected upper gastrointestinal bleeding,                            Suspected upper gastrointestinal bleeding in                            patient with unexplained iron deficiency anemia Procedure:                Pre-Anesthesia Assessment:                           - Prior to the procedure, a History and Physical                            was performed, and patient medications and                            allergies were reviewed. The patient's tolerance of                            previous anesthesia was also reviewed. The risks                            and benefits of the procedure and the sedation                            options and risks were discussed with the patient.                            All questions were answered, and informed consent                            was obtained. Prior Anticoagulants: The patient has                            taken no previous anticoagulant or antiplatelet                            agents. ASA Grade Assessment: II - A patient with                            mild systemic disease. After reviewing the risks                            and benefits, the patient was deemed in  satisfactory condition to undergo the procedure.                           After obtaining informed consent, the endoscope was                            passed under direct vision. Throughout the                            procedure, the patient's blood pressure, pulse, and                            oxygen saturations were monitored continuously. The                         Endoscope was introduced through the mouth, and                            advanced to the second part of duodenum. The upper                            GI endoscopy was accomplished without difficulty.                            The patient tolerated the procedure well. Scope In: Scope Out: Findings:                 The Z-line was regular and was found 35 cm from the                            incisors.                           No gross lesions were noted in the entire esophagus.                           A large hiatal hernia was present.                           The cardia and gastric fundus were normal on                            retroflexion.                           The examined duodenum was normal. Complications:            No immediate complications. Estimated Blood Loss:     Estimated blood loss was minimal. Impression:               - Z-line regular, 35 cm from the incisors.                           - No gross lesions in esophagus.                           -  Large hiatal hernia.                           - Normal examined duodenum.                           - No specimens collected. Recommendation:           - Patient has a contact number available for                            emergencies. The signs and symptoms of potential                            delayed complications were discussed with the                            patient. Return to normal activities tomorrow.                            Written discharge instructions were provided to the                            patient.                           - Resume previous diet.                           - Continue present medications.                           - See the other procedure note for documentation of                            additional recommendations. Napoleon Form, MD 11/21/2019 10:55:26 AM This report has been signed electronically.

## 2019-11-23 ENCOUNTER — Encounter: Payer: Self-pay | Admitting: Gastroenterology

## 2019-11-23 ENCOUNTER — Telehealth: Payer: Self-pay

## 2019-11-23 NOTE — Telephone Encounter (Signed)
  Follow up Call-  Call back number 11/21/2019 08/31/2017  Post procedure Call Back phone  # [phone]  Permission to leave phone message Yes Yes  Some recent data might be hidden     Patient questions:  Do you have a fever, pain , or abdominal swelling? No. Pain Score  0 *  Have you tolerated food without any problems? Yes.    Have you been able to return to your normal activities? Yes.    Do you have any questions about your discharge instructions: Diet   No. Medications  No. Follow up visit  No.  Do you have questions or concerns about your Care? No.  Actions: * If pain score is 4 or above: No action needed, pain <4.  1. Have you developed a fever since your procedure? no  2.   Have you had an respiratory symptoms (SOB or cough) since your procedure? no  3.   Have you tested positive for COVID 19 since your procedure no 4.   Have you had any family members/close contacts diagnosed with the COVID 19 since your procedure?  no  If yes to any of these questions please route to Joylene John, RN and Erenest Rasher, RN

## 2019-11-24 ENCOUNTER — Other Ambulatory Visit: Payer: Self-pay

## 2019-11-24 ENCOUNTER — Telehealth: Payer: Self-pay

## 2019-11-24 DIAGNOSIS — D500 Iron deficiency anemia secondary to blood loss (chronic): Secondary | ICD-10-CM

## 2019-11-24 DIAGNOSIS — D649 Anemia, unspecified: Secondary | ICD-10-CM

## 2019-11-24 NOTE — Telephone Encounter (Signed)
Spoke with the patient. She needs a video capsule study to evaluate her IDA. She is waiting to here from [place] about her appointment. Patient agrees to 12/01/19. Reviewed over the phone very briefly, the instructions, and the need for collection of the capsule. She asks the instructions be mailed to her. She does not live in [place]. Instructions mailed out with the mail of today from [place].

## 2019-12-01 ENCOUNTER — Other Ambulatory Visit: Payer: Self-pay

## 2019-12-01 ENCOUNTER — Encounter: Payer: Self-pay | Admitting: Gastroenterology

## 2019-12-01 ENCOUNTER — Ambulatory Visit: Payer: Medicare Other | Admitting: Gastroenterology

## 2019-12-01 DIAGNOSIS — D500 Iron deficiency anemia secondary to blood loss (chronic): Secondary | ICD-10-CM

## 2019-12-01 NOTE — Progress Notes (Signed)
SN: [id number] Exp: 11/29/2020 LOT: 09-24-3 Patient arrived for VCE. Reported the prep went well. This nurse explained capsule retrieval process and dietary restrictions for the next few hours. Patient verbalized understanding. Opened capsule, ensured capsule was flashing prior to the patient swallowing the capsule. Patient swallowed capsule without difficulty. Patient given retrieval supplies. Patient told to call the office with any questions and if no capsule was retrieved after 72 hours. No further questions by the conclusion of the visit

## 2019-12-01 NOTE — Patient Instructions (Signed)

## 2020-01-19 ENCOUNTER — Telehealth: Payer: Self-pay

## 2020-01-19 ENCOUNTER — Other Ambulatory Visit: Payer: Self-pay

## 2020-01-19 DIAGNOSIS — D500 Iron deficiency anemia secondary to blood loss (chronic): Secondary | ICD-10-CM

## 2020-01-19 NOTE — Telephone Encounter (Signed)
Spoke with the patient. Reminder call to come have labs drawn. She is due ferritin and CBC. Iron infusion was on 12/06/19 and 12/13/19. Patient thanks me for the call. She will take care of it upon her return from [place] next week.

## 2020-02-08 ENCOUNTER — Other Ambulatory Visit (INDEPENDENT_AMBULATORY_CARE_PROVIDER_SITE_OTHER): Payer: Medicare Other

## 2020-02-08 DIAGNOSIS — D500 Iron deficiency anemia secondary to blood loss (chronic): Secondary | ICD-10-CM

## 2020-02-08 LAB — CBC WITH DIFFERENTIAL/PLATELET
Basophils Absolute: 0.1 10*3/uL (ref 0.0–0.1)
Basophils Relative: 0.9 % (ref 0.0–3.0)
Eosinophils Absolute: 0.2 10*3/uL (ref 0.0–0.7)
Eosinophils Relative: 3.5 % (ref 0.0–5.0)
HCT: 41.1 % (ref 36.0–46.0)
Hemoglobin: 13.9 g/dL (ref 12.0–15.0)
Lymphocytes Relative: 19.8 % (ref 12.0–46.0)
Lymphs Abs: 1.4 10*3/uL (ref 0.7–4.0)
MCHC: 33.8 g/dL (ref 30.0–36.0)
MCV: 87.4 fl (ref 78.0–100.0)
Monocytes Absolute: 0.4 10*3/uL (ref 0.1–1.0)
Monocytes Relative: 5.3 % (ref 3.0–12.0)
Neutro Abs: 4.9 10*3/uL (ref 1.4–7.7)
Neutrophils Relative %: 70.5 % (ref 43.0–77.0)
Platelets: 224 10*3/uL (ref 150.0–400.0)
RBC: 4.7 Mil/uL (ref 3.87–5.11)
RDW: 23 % — ABNORMAL HIGH (ref 11.5–15.5)
WBC: 7 10*3/uL (ref 4.0–10.5)

## 2020-02-08 LAB — FERRITIN: Ferritin: 111.5 ng/mL (ref 10.0–291.0)

## 2020-04-26 ENCOUNTER — Other Ambulatory Visit: Payer: Self-pay | Admitting: Family

## 2020-04-26 DIAGNOSIS — Z1231 Encounter for screening mammogram for malignant neoplasm of breast: Secondary | ICD-10-CM

## 2020-05-11 ENCOUNTER — Other Ambulatory Visit: Payer: Self-pay

## 2020-05-11 ENCOUNTER — Ambulatory Visit
Admission: RE | Admit: 2020-05-11 | Discharge: 2020-05-11 | Disposition: A | Discharge status: Home / Self Care | Payer: Medicare Other | Source: Ambulatory Visit | Attending: Family | Admitting: Family

## 2020-05-11 DIAGNOSIS — Z1231 Encounter for screening mammogram for malignant neoplasm of breast: Secondary | ICD-10-CM

## 2021-03-03 ENCOUNTER — Emergency Department (HOSPITAL_BASED_OUTPATIENT_CLINIC_OR_DEPARTMENT_OTHER)
Admission: EM | Admit: 2021-03-03 | Discharge: 2021-03-03 | Disposition: A | Discharge status: Home / Self Care | Payer: Medicare Other | Attending: Emergency Medicine | Admitting: Emergency Medicine

## 2021-03-03 ENCOUNTER — Encounter (HOSPITAL_BASED_OUTPATIENT_CLINIC_OR_DEPARTMENT_OTHER): Payer: Self-pay | Admitting: Obstetrics and Gynecology

## 2021-03-03 ENCOUNTER — Emergency Department (HOSPITAL_BASED_OUTPATIENT_CLINIC_OR_DEPARTMENT_OTHER): Payer: Medicare Other

## 2021-03-03 ENCOUNTER — Other Ambulatory Visit: Payer: Self-pay

## 2021-03-03 DIAGNOSIS — N12 Tubulo-interstitial nephritis, not specified as acute or chronic: Secondary | ICD-10-CM

## 2021-03-03 DIAGNOSIS — K219 Gastro-esophageal reflux disease without esophagitis: Secondary | ICD-10-CM | POA: Insufficient documentation

## 2021-03-03 DIAGNOSIS — R109 Unspecified abdominal pain: Secondary | ICD-10-CM | POA: Diagnosis not present

## 2021-03-03 DIAGNOSIS — J45909 Unspecified asthma, uncomplicated: Secondary | ICD-10-CM | POA: Insufficient documentation

## 2021-03-03 DIAGNOSIS — Z96653 Presence of artificial knee joint, bilateral: Secondary | ICD-10-CM | POA: Diagnosis not present

## 2021-03-03 DIAGNOSIS — R319 Hematuria, unspecified: Secondary | ICD-10-CM | POA: Diagnosis not present

## 2021-03-03 DIAGNOSIS — N390 Urinary tract infection, site not specified: Secondary | ICD-10-CM

## 2021-03-03 LAB — BASIC METABOLIC PANEL
Anion gap: 8 (ref 5–15)
BUN: 7 mg/dL (ref 6–20)
CO2: 29 mmol/L (ref 22–32)
Calcium: 9.7 mg/dL (ref 8.9–10.3)
Chloride: 101 mmol/L (ref 98–111)
Creatinine, Ser: 0.82 mg/dL (ref 0.44–1.00)
GFR, Estimated: >60 mL/min (ref >60)
Glucose, Bld: 103 mg/dL — ABNORMAL HIGH (ref 70–99)
Potassium: 3.9 mmol/L (ref 3.5–5.1)
Sodium: 138 mmol/L (ref 135–145)

## 2021-03-03 LAB — URINALYSIS, ROUTINE W REFLEX MICROSCOPIC
Bilirubin Urine: NEGATIVE
Glucose, UA: NEGATIVE mg/dL
Ketones, ur: NEGATIVE mg/dL
Nitrite: NEGATIVE
Protein, ur: 100 mg/dL — AB
RBC / HPF: >50 RBC/hpf — ABNORMAL HIGH (ref 0–5)
Specific Gravity, Urine: 1.008 (ref 1.005–1.030)
WBC, UA: >50 WBC/hpf — ABNORMAL HIGH (ref 0–5)
pH: 7.5 (ref 5.0–8.0)

## 2021-03-03 LAB — CBC
HCT: 47 % — ABNORMAL HIGH (ref 36.0–46.0)
Hemoglobin: 15.2 g/dL — ABNORMAL HIGH (ref 12.0–15.0)
MCH: 29.8 pg (ref 26.0–34.0)
MCHC: 32.3 g/dL (ref 30.0–36.0)
MCV: 92.2 fL (ref 80.0–100.0)
Platelets: 201 10*3/uL (ref 150–400)
RBC: 5.1 MIL/uL (ref 3.87–5.11)
RDW: 14.7 % (ref 11.5–15.5)
WBC: 9.4 10*3/uL (ref 4.0–10.5)
nRBC: 0 % (ref 0.0–0.2)

## 2021-03-03 MED ORDER — CEFDINIR 300 MG PO CAPS: 300.0000 mg | ORAL_CAPSULE | Freq: Two times a day (BID) | ORAL | 0 refills | Status: DC

## 2021-03-03 MED ORDER — HYDROMORPHONE HCL 1 MG/ML IJ SOLN
1.0000 mg | Freq: Once | INTRAMUSCULAR | Status: AC
  Administered 2021-03-03: 1 mg via INTRAVENOUS
  Filled 2021-03-03: qty 1

## 2021-03-03 MED ORDER — ONDANSETRON HCL 4 MG/2ML IJ SOLN
4.0000 mg | Freq: Once | INTRAMUSCULAR | Status: AC
  Administered 2021-03-03: 4 mg via INTRAVENOUS
  Filled 2021-03-03: qty 2

## 2021-03-03 MED ORDER — SODIUM CHLORIDE 0.9 % IV SOLN
1.0000 g | Freq: Once | INTRAVENOUS | Status: AC
  Administered 2021-03-03: 1 g via INTRAVENOUS
  Filled 2021-03-03: qty 10

## 2021-03-03 MED ORDER — KETOROLAC TROMETHAMINE 30 MG/ML IJ SOLN
30.0000 mg | Freq: Once | INTRAMUSCULAR | Status: AC
  Administered 2021-03-03: 30 mg via INTRAVENOUS
  Filled 2021-03-03: qty 1

## 2021-03-03 NOTE — ED Notes (Signed)
Ambulated to bathroom

## 2021-03-03 NOTE — Discharge Instructions (Addendum)
It was our pleasure to provide your ER care today - we hope that you feel better.  Drink plenty of fluids, stay well hydrated. Your ct scan shows no obstruction stone, although incidental note was made of tiny stones in the kidneys.   Take antibiotic as prescribed.   Take acetaminophen or ibuprofen as need.   Follow up with primary care doctor in 3-4 days if symptoms fail to improve/resolve.  Return to ER if worse, new symptoms, high fevers, worsening, severe or intractable pain, persistent vomiting, or other concern.   You were given pain meds in the ER - no driving for the next 6 hours.

## 2021-03-03 NOTE — ED Triage Notes (Signed)
Patient reports to the ER for right sided flank pain and lower back. Patient reports she has had chills. Patient states she saw blood in her urine this morning at 0400. Patient denies fever

## 2021-03-03 NOTE — ED Provider Notes (Signed)
[facility] EMERGENCY DEPT Provider Note   CSN: [id number] Arrival date & time: 03/03/21  1041     History Chief Complaint  Patient presents with   Flank Pain    [name] [name] is a 60 y.o. female.  Patient c/o acute onset right flank pain while sleeping last night. Symptoms acute onset, mod-severe, constant, persistent, dull, non radiating. Remote hx kidney stone. +blood in urine. No dysuria. No anterior pain, no abd or pelvic pain. No fever or chills. Nausea. No vomiting.   The history is provided by the patient.  Flank Pain Pertinent negatives include no chest pain, no abdominal pain, no headaches and no shortness of breath.      Past Medical History:  Diagnosis Date   Allergy    Anemia 07/2017   on meds   Arthritis    Asthma    Black-out (not amnesia) 06/2017   black out spells due to anemia!   Blood transfusion without reported diagnosis    Pt thinks she had 3 units blood in Dec, 2018   GERD (gastroesophageal reflux disease)    History of kidney stones    Kidney stones    Migraine    Restless leg syndrome    Swelling of lower limb    bialteral lower leg swelling    Patient Active Problem List   Diagnosis Date Noted   Iron deficiency anemia due to chronic blood loss    Symptomatic anemia 07/09/2017   SIRS (systemic inflammatory response syndrome) ([place]) 07/09/2017   Hypokalemia 07/09/2017   Bronchitis 07/09/2017   Arthritis 07/09/2017   URI (upper respiratory infection) 07/09/2017   OA (osteoarthritis) of knee 01/26/2017   Nephrolithiasis 11/15/2013   Chest pain 10/22/2012   Migraine 10/22/2012   Numbness and tingling in hands 10/22/2012    Past Surgical History:  Procedure Laterality Date   COLONOSCOPY     MULTIPLE TOOTH EXTRACTIONS     right eye lid surgery      TOTAL KNEE ARTHROPLASTY Left 01/26/2017   Procedure: LEFT TOTAL KNEE ARTHROPLASTY;  Surgeon: Gaynelle Arabian, MD;  Location: [facility];  Service: Orthopedics;  Laterality:  Left;  with block   TOTAL KNEE ARTHROPLASTY Right 10/26/2017   Procedure: RIGHT TOTAL KNEE ARTHROPLASTY;  Surgeon: Gaynelle Arabian, MD;  Location: [facility];  Service: Orthopedics;  Laterality: Right;   TUBAL LIGATION       OB History   No obstetric history on file.     Family History  Problem Relation Age of Onset   Hypertension Mother    Diabetes Mellitus II Mother    Hypertension Father    Diabetes Mellitus II Father    Colon polyps Father    Colon cancer Neg Hx    Esophageal cancer Neg Hx    Stomach cancer Neg Hx    Rectal cancer Neg Hx     Social History   Tobacco Use   Smoking status: Never   Smokeless tobacco: Never  Vaping Use   Vaping Use: Never used  Substance Use Topics   Alcohol use: Not Currently   Drug use: No    Home Medications Prior to Admission medications   Medication Sig Start Date End Date Taking? Authorizing Provider  cetirizine (ZYRTEC) 10 MG tablet Take 10 mg by mouth at bedtime.    [provider]  esomeprazole (NEXIUM) 10 MG packet Take 10 mg by mouth 2 (two) times daily.    [provider]  ferrous sulfate 325 (65 FE) MG tablet  Take 162.5 mg by mouth. Every 3 days    [provider]  furosemide (LASIX) 20 MG tablet Take 40 mg by mouth as needed.     [provider]  gabapentin (NEURONTIN) 400 MG capsule Take 800 mg by mouth at bedtime.     [provider]  metoprolol tartrate (LOPRESSOR) 25 MG tablet Take 25 mg by mouth 2 (two) times daily. 10/09/19   [provider]  Na Sulfate-K Sulfate-Mg Sulf (SUPREP BOWEL PREP KIT) 17.5-3.13-1.6 GM/177ML SOLN Suprep as directed, no substitutions 11/03/19   Nandigam, Venia Minks, MD  Polyvinyl Alcohol-Povidone (REFRESH OP) Apply to eye. PRN    [provider]  POTASSIUM PO Take by mouth daily.    [provider]  PROAIR RESPICLICK 400 (90 Base) MCG/ACT AEPB Inhale 1 puff into the lungs every 4 (four) hours as needed. 10/09/19   [provider]  traZODone (DESYREL) 100 MG tablet Take 50 mg by mouth at bedtime. 05/06/19   [provider]  vitamin E 400 UNIT capsule Take 400 Units by mouth daily.    [provider]    Allergies    Acetaminophen, Penicillins, and Topamax [topiramate]  Review of Systems   Review of Systems  Constitutional:  Negative for chills and fever.  HENT:  Negative for sore throat.   Eyes:  Negative for redness.  Respiratory:  Negative for shortness of breath.   Cardiovascular:  Negative for chest pain.  Gastrointestinal:  Negative for abdominal pain.  Genitourinary:  Positive for flank pain and hematuria. Negative for dysuria.  Musculoskeletal:  Negative for neck pain.  Skin:  Negative for rash.  Neurological:  Negative for headaches.  Hematological:  Does not bruise/bleed easily.  Psychiatric/Behavioral:  Negative for confusion.    Physical Exam Updated Vital Signs BP 139/87 (BP Location: Left Arm)   Pulse 89   Temp 99.8 F (37.7 C) (Tympanic)   Resp 16   LMP 06/24/2012   SpO2 96%   Physical Exam Vitals and nursing note reviewed.  Constitutional:      Appearance: Normal appearance. She is well-developed.  HENT:     Head: Atraumatic.     Nose: Nose normal.     Mouth/Throat:     Mouth: Mucous membranes are moist.  Eyes:     General: No scleral icterus.    Conjunctiva/sclera: Conjunctivae normal.  Neck:     Trachea: No tracheal deviation.  Cardiovascular:     Rate and Rhythm: Normal rate and regular rhythm.     Pulses: Normal pulses.     Heart sounds: Normal heart sounds. No murmur heard.   No friction rub. No gallop.  Pulmonary:     Effort: Pulmonary effort is normal. No respiratory distress.     Breath sounds: Normal breath sounds.  Abdominal:     General: Bowel sounds are normal. There is no distension.     Palpations: Abdomen is soft.     Tenderness: There is no abdominal tenderness. There is no guarding.  Genitourinary:    Comments: No cva  tenderness.  Musculoskeletal:        General: No swelling.     Cervical back: Normal range of motion and neck supple. No rigidity. No muscular tenderness.     Left lower leg: Left lower leg edema: .edthis.     Comments: T/L/S spine nontender, aligned.   Skin:    General: Skin is warm and dry.     Findings: No rash.  Neurological:  Mental Status: She is alert.     Comments: Alert, speech normal.   Psychiatric:        Mood and Affect: Mood normal.    ED Results / Procedures / Treatments   Labs (all labs ordered are listed, but only abnormal results are displayed) Results for orders placed or performed during the hospital encounter of 03/03/21  Urinalysis, Routine w reflex microscopic Urine, Clean Catch  Result Value Ref Range   Color, Urine BROWN (A) YELLOW   APPearance HAZY (A) CLEAR   Specific Gravity, Urine 1.008 1.005 - 1.030   pH 7.5 5.0 - 8.0   Glucose, UA NEGATIVE NEGATIVE mg/dL   Hgb urine dipstick LARGE (A) NEGATIVE   Bilirubin Urine NEGATIVE NEGATIVE   Ketones, ur NEGATIVE NEGATIVE mg/dL   Protein, ur 100 (A) NEGATIVE mg/dL   Nitrite NEGATIVE NEGATIVE   Leukocytes,Ua MODERATE (A) NEGATIVE   RBC / HPF >50 (H) 0 - 5 RBC/hpf   WBC, UA >50 (H) 0 - 5 WBC/hpf   Bacteria, UA FEW (A) NONE SEEN  Basic metabolic panel  Result Value Ref Range   Sodium 138 135 - 145 mmol/L   Potassium 3.9 3.5 - 5.1 mmol/L   Chloride 101 98 - 111 mmol/L   CO2 29 22 - 32 mmol/L   Glucose, Bld 103 (H) 70 - 99 mg/dL   BUN 7 6 - 20 mg/dL   Creatinine, Ser 0.82 0.44 - 1.00 mg/dL   Calcium 9.7 8.9 - 10.3 mg/dL   GFR, Estimated >60 >60 mL/min   Anion gap 8 5 - 15  CBC  Result Value Ref Range   WBC 9.4 4.0 - 10.5 K/uL   RBC 5.10 3.87 - 5.11 MIL/uL   Hemoglobin 15.2 (H) 12.0 - 15.0 g/dL   HCT 47.0 (H) 36.0 - 46.0 %   MCV 92.2 80.0 - 100.0 fL   MCH 29.8 26.0 - 34.0 pg   MCHC 32.3 30.0 - 36.0 g/dL   RDW 14.7 11.5 - 15.5 %   Platelets 201 150 - 400 K/uL   nRBC 0.0 0.0 - 0.2 %      EKG None  Radiology CT Renal Stone Study  Result Date: 03/03/2021 CLINICAL DATA:  Right-sided flank pain. EXAM: CT ABDOMEN AND PELVIS WITHOUT CONTRAST TECHNIQUE: Multidetector CT imaging of the abdomen and pelvis was performed following the standard protocol without IV contrast. COMPARISON:  CT abdomen pelvis 11/15/2013 FINDINGS: Lower chest: Large hiatal hernia.  Lung bases are clear. Evaluation of the abdominal viscera limited by the lack of IV contrast. Hepatobiliary: No focal liver abnormality is seen. Normal gallbladder. Pancreas: Unremarkable. No surrounding inflammatory changes. Spleen: Normal in size without focal abnormality. Adrenals/Urinary Tract: Adrenal glands are unremarkable. There is 1 tiny calcification in each kidney. There is mild right hydronephrosis and ureterectasis without a definite obstructing calculus. Mild periureteral stranding. No left hydronephrosis. No mass lesion. Urinary bladder is unremarkable. Stomach/Bowel: Stomach is within normal limits. Appendix appears normal. No evidence of bowel wall thickening, distention, or inflammatory changes. Vascular/Lymphatic: Vascular patency cannot be assessed in the absence of IV contrast. No enlarged abdominal or pelvic lymph nodes. Reproductive: Uterus and bilateral adnexa are unremarkable. Other: No abdominal wall hernia or abnormality. No abdominopelvic ascites. Musculoskeletal: No acute or significant osseous findings. IMPRESSION: 1. There is mild right hydronephrosis and ureterectasis without a definite obstructing calculus. Findings may represent a recently passed stone or infection. 2. Tiny bilateral renal calculi. 3. Large hiatal hernia. Electronically Signed   By: Izora Gala  Dimas Aguas M.D.   On: 03/03/2021 12:13    Procedures Procedures   Medications Ordered in ED Medications - No data to display  ED Course  I have reviewed the triage vital signs and the nursing notes.  Pertinent labs & imaging results that were  available during my care of the patient were reviewed by me and considered in my medical decision making (see chart for details).    MDM Rules/Calculators/A&P                          Iv ns. Stat labs. Ct.   Reviewed nursing notes and prior charts for additional history.   CT reviewed/interpreted by me - no obstructing stone.   Labs reviewed/interpreted by me - +hematuria.   DIlaudid iv. Toradol iv. Zofran iv.   Recheck pain improved. No nv.   Rocephin 1 gm iv.   Recheck pt comfortable, no distress. Pt appears stable for d/c.   Discussed ct w pt.      Final Clinical Impression(s) / ED Diagnoses Final diagnoses:  None    Rx / DC Orders ED Discharge Orders     None        Lajean Saver, MD 03/03/21 1357

## 2021-03-06 LAB — URINE CULTURE: Culture: >=100000 — AB

## 2021-03-07 ENCOUNTER — Telehealth: Payer: Self-pay | Admitting: *Deleted

## 2021-03-07 NOTE — Telephone Encounter (Signed)
Post ED Visit - Positive Culture Follow-up  Culture report reviewed by antimicrobial stewardship pharmacist: [place] Team []  Elenor Quinones, Pharm.D. []  Heide Guile, Pharm.D., BCPS AQ-ID []  Parks Neptune, Pharm.D., BCPS []  Alycia Rossetti, Pharm.D., BCPS []  [place], [place].D., BCPS, AAHIVP []  Legrand Como, Pharm.D., BCPS, AAHIVP []  Salome Arnt, PharmD, BCPS []  Johnnette Gourd, PharmD, BCPS []  Hughes Better, PharmD, BCPS []  Leeroy Cha, PharmD []  Laqueta Linden, PharmD, BCPS []  Albertina Parr, PharmD  [place] Team []  Leodis Sias, PharmD []  Lindell Spar, PharmD []  Royetta Asal, PharmD []  Graylin Shiver, Rph []  Rema Fendt) Glennon Mac, PharmD []  Arlyn Dunning, PharmD []  Netta Cedars, PharmD []  Dia Sitter, PharmD []  Leone Haven, PharmD []  Gretta Arab, PharmD []  Theodis Shove, PharmD []  Peggyann Juba, PharmD []  Reuel Boom, PharmD   Positive urine culture Treated with Cefdinir, organism sensitive to the same and no further patient follow-up is required at this time.  Andreas Blower, PharmD  Harlon Flor Talley 03/07/2021, 9:52 AM

## 2021-04-05 ENCOUNTER — Other Ambulatory Visit: Payer: Self-pay | Admitting: Family

## 2021-04-05 DIAGNOSIS — Z1231 Encounter for screening mammogram for malignant neoplasm of breast: Secondary | ICD-10-CM

## 2021-05-13 ENCOUNTER — Other Ambulatory Visit: Payer: Self-pay

## 2021-05-13 ENCOUNTER — Ambulatory Visit
Admission: RE | Admit: 2021-05-13 | Discharge: 2021-05-13 | Disposition: A | Discharge status: Home / Self Care | Payer: Medicare Other | Source: Ambulatory Visit | Attending: Family | Admitting: Family

## 2021-05-13 DIAGNOSIS — Z1231 Encounter for screening mammogram for malignant neoplasm of breast: Secondary | ICD-10-CM

## 2021-05-20 ENCOUNTER — Other Ambulatory Visit: Payer: Self-pay | Admitting: Family

## 2021-05-20 DIAGNOSIS — R928 Other abnormal and inconclusive findings on diagnostic imaging of breast: Secondary | ICD-10-CM

## 2021-06-11 ENCOUNTER — Other Ambulatory Visit: Payer: Self-pay

## 2021-06-11 ENCOUNTER — Ambulatory Visit
Admission: RE | Admit: 2021-06-11 | Discharge: 2021-06-11 | Disposition: A | Discharge status: Home / Self Care | Payer: Medicare Other | Source: Ambulatory Visit | Attending: Family | Admitting: Family

## 2021-06-11 DIAGNOSIS — R928 Other abnormal and inconclusive findings on diagnostic imaging of breast: Secondary | ICD-10-CM

## 2022-05-12 ENCOUNTER — Emergency Department (HOSPITAL_BASED_OUTPATIENT_CLINIC_OR_DEPARTMENT_OTHER): Payer: Medicare Other

## 2022-05-12 ENCOUNTER — Other Ambulatory Visit: Payer: Self-pay

## 2022-05-12 ENCOUNTER — Emergency Department (HOSPITAL_BASED_OUTPATIENT_CLINIC_OR_DEPARTMENT_OTHER)
Admission: EM | Admit: 2022-05-12 | Discharge: 2022-05-12 | Disposition: A | Discharge status: Home / Self Care | Payer: Medicare Other | Attending: Emergency Medicine | Admitting: Emergency Medicine

## 2022-05-12 ENCOUNTER — Encounter (HOSPITAL_BASED_OUTPATIENT_CLINIC_OR_DEPARTMENT_OTHER): Payer: Self-pay | Admitting: Emergency Medicine

## 2022-05-12 DIAGNOSIS — R1084 Generalized abdominal pain: Secondary | ICD-10-CM | POA: Diagnosis present

## 2022-05-12 DIAGNOSIS — K922 Gastrointestinal hemorrhage, unspecified: Secondary | ICD-10-CM | POA: Diagnosis not present

## 2022-05-12 DIAGNOSIS — K8020 Calculus of gallbladder without cholecystitis without obstruction: Secondary | ICD-10-CM | POA: Diagnosis not present

## 2022-05-12 LAB — COMPREHENSIVE METABOLIC PANEL
ALT: 12 U/L (ref 0–44)
AST: 31 U/L (ref 15–41)
Albumin: 4.6 g/dL (ref 3.5–5.0)
Alkaline Phosphatase: 108 U/L (ref 38–126)
Anion gap: 11 (ref 5–15)
BUN: 18 mg/dL (ref 8–23)
CO2: 25 mmol/L (ref 22–32)
Calcium: 9.4 mg/dL (ref 8.9–10.3)
Chloride: 103 mmol/L (ref 98–111)
Creatinine, Ser: 0.81 mg/dL (ref 0.44–1.00)
GFR, Estimated: >60 mL/min (ref >60)
Glucose, Bld: 120 mg/dL — ABNORMAL HIGH (ref 70–99)
Potassium: 4.9 mmol/L (ref 3.5–5.1)
Sodium: 139 mmol/L (ref 135–145)
Total Bilirubin: 0.5 mg/dL (ref 0.3–1.2)
Total Protein: 7.7 g/dL (ref 6.5–8.1)

## 2022-05-12 LAB — URINALYSIS, ROUTINE W REFLEX MICROSCOPIC
Bilirubin Urine: NEGATIVE
Glucose, UA: NEGATIVE mg/dL
Hgb urine dipstick: NEGATIVE
Ketones, ur: NEGATIVE mg/dL
Leukocytes,Ua: NEGATIVE
Nitrite: NEGATIVE
Specific Gravity, Urine: 1.021 (ref 1.005–1.030)
pH: 7.5 (ref 5.0–8.0)

## 2022-05-12 LAB — CBC
HCT: 38.6 % (ref 36.0–46.0)
Hemoglobin: 12.2 g/dL (ref 12.0–15.0)
MCH: 28 pg (ref 26.0–34.0)
MCHC: 31.6 g/dL (ref 30.0–36.0)
MCV: 88.7 fL (ref 80.0–100.0)
Platelets: 159 10*3/uL (ref 150–400)
RBC: 4.35 MIL/uL (ref 3.87–5.11)
RDW: 18.7 % — ABNORMAL HIGH (ref 11.5–15.5)
WBC: 10.3 10*3/uL (ref 4.0–10.5)
nRBC: 0 % (ref 0.0–0.2)

## 2022-05-12 LAB — LIPASE, BLOOD: Lipase: 22 U/L (ref 11–51)

## 2022-05-12 LAB — OCCULT BLOOD X 1 CARD TO LAB, STOOL: Fecal Occult Bld: POSITIVE — AB

## 2022-05-12 MED ORDER — PANTOPRAZOLE SODIUM 40 MG PO TBEC: 40.0000 mg | DELAYED_RELEASE_TABLET | Freq: Every day | ORAL | 0 refills | Status: DC

## 2022-05-12 MED ORDER — ONDANSETRON HCL 4 MG/2ML IJ SOLN
4.0000 mg | Freq: Once | INTRAMUSCULAR | Status: AC
  Administered 2022-05-12: 4 mg via INTRAVENOUS
  Filled 2022-05-12: qty 2

## 2022-05-12 MED ORDER — PANTOPRAZOLE SODIUM 40 MG PO TBEC
40.0000 mg | DELAYED_RELEASE_TABLET | Freq: Once | ORAL | Status: AC
  Administered 2022-05-12: 40 mg via ORAL
  Filled 2022-05-12: qty 1

## 2022-05-12 MED ORDER — IOHEXOL 300 MG/ML  SOLN
100.0000 mL | Freq: Once | INTRAMUSCULAR | Status: AC | PRN
  Administered 2022-05-12: 80 mL via INTRAVENOUS

## 2022-05-12 MED ORDER — ONDANSETRON 4 MG PO TBDP: 4.0000 mg | ORAL_TABLET | Freq: Three times a day (TID) | ORAL | 0 refills | Status: DC | PRN

## 2022-05-12 MED ORDER — LOPERAMIDE HCL 2 MG PO CAPS
2.0000 mg | ORAL_CAPSULE | Freq: Once | ORAL | Status: AC
  Administered 2022-05-12: 2 mg via ORAL
  Filled 2022-05-12: qty 1

## 2022-05-12 MED ORDER — MORPHINE SULFATE (PF) 4 MG/ML IV SOLN
4.0000 mg | Freq: Once | INTRAVENOUS | Status: AC
  Administered 2022-05-12: 4 mg via INTRAVENOUS
  Filled 2022-05-12: qty 1

## 2022-05-12 MED ORDER — SODIUM CHLORIDE 0.9 % IV BOLUS
500.0000 mL | Freq: Once | INTRAVENOUS | Status: AC
  Administered 2022-05-12: 500 mL via INTRAVENOUS

## 2022-05-12 NOTE — ED Notes (Signed)
Patient verbalizes understanding of discharge instructions. Opportunity for questioning and answers were provided. Patient discharged from ED.  °

## 2022-05-12 NOTE — ED Notes (Signed)
Pt aware of need for urine. Unable to currently.

## 2022-05-12 NOTE — ED Notes (Signed)
[place]ported to [place].

## 2022-05-12 NOTE — ED Provider Notes (Signed)
[facility] EMERGENCY DEPT Provider Note   CSN: [id number] Arrival date & time: 05/12/22  1118     History  Chief Complaint  Patient presents with   Emesis   Diarrhea    Brelyn ALLIE [name] is a 61 y.o. female.  61 year old female with history of ulcerative colitis, kidney stones, GERD, RLS presents with concern for generalized abdominal discomfort associated with nausea, vomiting, loose/watery stools.  Notes that her emesis and stools also appear black.  No history of ulcer disease, does not use NSAIDs due to her ulcerative colitis.  States that she feels like she has had chills today.  Did have a family cookout yesterday, no known sick contacts.  Prior abdominal surgeries include tubal ligation and hernia repair.       Home Medications Prior to Admission medications   Medication Sig Start Date End Date Taking? Authorizing Provider  cefdinir (OMNICEF) 300 MG capsule Take 1 capsule (300 mg total) by mouth 2 (two) times daily. 03/03/21   Lajean Saver, MD  cetirizine (ZYRTEC) 10 MG tablet Take 10 mg by mouth at bedtime.    [provider]  esomeprazole (NEXIUM) 10 MG packet Take 10 mg by mouth 2 (two) times daily.    [provider]  ferrous sulfate 325 (65 FE) MG tablet Take 162.5 mg by mouth. Every 3 days    [provider]  furosemide (LASIX) 20 MG tablet Take 40 mg by mouth as needed.     [provider]  gabapentin (NEURONTIN) 400 MG capsule Take 800 mg by mouth at bedtime.     [provider]  metoprolol tartrate (LOPRESSOR) 25 MG tablet Take 25 mg by mouth 2 (two) times daily. 10/09/19   [provider]  Na Sulfate-K Sulfate-Mg Sulf (SUPREP BOWEL PREP KIT) 17.5-3.13-1.6 GM/177ML SOLN Suprep as directed, no substitutions 11/03/19   Nandigam, Venia Minks, MD  ondansetron (ZOFRAN-ODT) 4 MG disintegrating tablet Take 1 tablet (4 mg total) by mouth every 8 (eight) hours as needed for nausea or vomiting. 05/12/22   Tacy Learn, PA-C  pantoprazole (PROTONIX) 40 MG tablet Take 1 tablet (40 mg total) by mouth daily. 05/12/22 06/11/22  Tacy Learn, PA-C  Polyvinyl Alcohol-Povidone (REFRESH OP) Apply to eye. PRN    [provider]  POTASSIUM PO Take by mouth daily.    [provider]  PROAIR RESPICLICK 474 (90 Base) MCG/ACT AEPB Inhale 1 puff into the lungs every 4 (four) hours as needed. 10/09/19   [provider]  traZODone (DESYREL) 100 MG tablet Take 50 mg by mouth at bedtime. 05/06/19   [provider]  vitamin E 400 UNIT capsule Take 400 Units by mouth daily.    [provider]      Allergies    Acetaminophen, Penicillins, and Topamax [topiramate]    Review of Systems   Review of Systems Negative except as per HPI Physical Exam Updated Vital Signs BP 129/66   Pulse 90   Temp 99.2 F (37.3 C) (Oral)   Resp (!) 21   Ht 5' 4"  (1.626 m)   Wt 90.7 kg   LMP 08/25/2011   SpO2 96%   BMI 34.33 kg/m  Physical Exam Vitals and nursing note reviewed. Exam conducted with a chaperone present.  Constitutional:      General: She is not in acute distress.    Appearance: She is well-developed. She is not diaphoretic.  HENT:     Head: Normocephalic and atraumatic.  Cardiovascular:  Rate and Rhythm: Normal rate and regular rhythm.     Heart sounds: Normal heart sounds.  Pulmonary:     Effort: Pulmonary effort is normal.     Breath sounds: Normal breath sounds.  Abdominal:     General: Bowel sounds are normal.     Palpations: Abdomen is soft.     Tenderness: There is generalized abdominal tenderness. There is no guarding or rebound.  Genitourinary:    Rectum: Guaiac result positive.     Comments: Stool brown/grey, soft/watery Musculoskeletal:     Right lower leg: No edema.     Left lower leg: No edema.  Skin:    General: Skin is warm and dry.  Neurological:     Mental Status: She is alert and oriented to person, place, and time.  Psychiatric:         Behavior: Behavior normal.     ED Results / Procedures / Treatments   Labs (all labs ordered are listed, but only abnormal results are displayed) Labs Reviewed  COMPREHENSIVE METABOLIC PANEL - Abnormal; Notable for the following components:      Result Value   Glucose, Bld 120 (*)    All other components within normal limits  CBC - Abnormal; Notable for the following components:   RDW 18.7 (*)    All other components within normal limits  URINALYSIS, ROUTINE W REFLEX MICROSCOPIC - Abnormal; Notable for the following components:   Protein, ur TRACE (*)    All other components within normal limits  OCCULT BLOOD X 1 CARD TO LAB, STOOL - Abnormal; Notable for the following components:   Fecal Occult Bld POSITIVE (*)    All other components within normal limits  LIPASE, BLOOD    EKG None  Radiology US Abdomen Limited  Result Date: 05/12/2022 CLINICAL DATA:  Pain upper abdomen EXAM: ULTRASOUND ABDOMEN LIMITED RIGHT UPPER QUADRANT COMPARISON:  None Available. FINDINGS: Gallbladder: There are few small echogenic foci in the dependent portion of gallbladder lumen suggesting gallbladder stones and possibly sludge. There is no wall thickening in gallbladder. There is no fluid around the gallbladder. Technologist did not observe any tenderness over the gallbladder. Common bile duct: Diameter: 8.2 mm. Distal common bile duct is not optimally visualized. There is no dilation of intrahepatic bile ducts. Liver: No focal lesion identified. Within normal limits in parenchymal echogenicity. Portal vein is patent on color Doppler imaging with normal direction of blood flow towards the liver. Other: None. IMPRESSION: Gallbladder stones. There are no signs of acute cholecystitis. There is prominence of proximal common bile duct measuring 8.2 mm without dilation of intrahepatic bile ducts. Electronically Signed   By: Elmer Picker M.D.   On: 05/12/2022 15:11   CT Abdomen Pelvis W Contrast  Result  Date: 05/12/2022 CLINICAL DATA:  Abdominal pain, acute, nonlocalized EXAM: CT ABDOMEN AND PELVIS WITH CONTRAST TECHNIQUE: Multidetector CT imaging of the abdomen and pelvis was performed using the standard protocol following bolus administration of intravenous contrast. RADIATION DOSE REDUCTION: This exam was performed according to the departmental dose-optimization program which includes automated exposure control, adjustment of the mA and/or kV according to patient size and/or use of iterative reconstruction technique. CONTRAST:  1m OMNIPAQUE IOHEXOL 300 MG/ML  SOLN COMPARISON:  03/03/2021 FINDINGS: Lower chest: Large hiatal hernia, stable.  No acute abnormality. Hepatobiliary: Gallbladder is mildly distended. Tiny layering stones within the gallbladder. No biliary ductal dilatation. No suspicious focal hepatic abnormality. Pancreas: No focal abnormality or ductal dilatation. Spleen: No focal abnormality.  Normal  size. Adrenals/Urinary Tract: No adrenal abnormality. No focal renal abnormality. No stones or hydronephrosis. Urinary bladder is unremarkable. Stomach/Bowel: Normal appendix. Stomach, large and small bowel grossly unremarkable. Vascular/Lymphatic: No evidence of aneurysm or adenopathy. Reproductive: Uterus and adnexa unremarkable.  No mass. Other: No free fluid or free air. Musculoskeletal: No acute bony abnormality. IMPRESSION: Tiny layering gallstones within the gallbladder. Mild gallbladder distension. No ductal dilatation. If there is clinical concern for cholecystitis, this could be further evaluated with right upper quadrant ultrasound. Large hiatal hernia. Electronically Signed   By: Rolm Baptise M.D.   On: 05/12/2022 13:59    Procedures Procedures    Medications Ordered in ED Medications  loperamide (IMODIUM) capsule 2 mg (has no administration in time range)  sodium chloride 0.9 % bolus 500 mL (0 mLs Intravenous Stopped 05/12/22 1346)  ondansetron (ZOFRAN) injection 4 mg (4 mg  Intravenous Given 05/12/22 1202)  iohexol (OMNIPAQUE) 300 MG/ML solution 100 mL (80 mLs Intravenous Contrast Given 05/12/22 1346)  ondansetron (ZOFRAN) injection 4 mg (4 mg Intravenous Given 05/12/22 1420)  morphine (PF) 4 MG/ML injection 4 mg (4 mg Intravenous Given 05/12/22 1420)  pantoprazole (PROTONIX) EC tablet 40 mg (40 mg Oral Given 05/12/22 1629)    ED Course/ Medical Decision Making/ A&P                           Medical Decision Making Amount and/or Complexity of Data Reviewed Labs: ordered. Radiology: ordered.  Risk Prescription drug management.   This patient presents to the ED for concern of abdominal pain with vomiting and diarrhea, concern for blood in emesis and stools, this involves an extensive number of treatment options, and is a complaint that carries with it a high risk of complications and morbidity.  The differential diagnosis includes but not limited to colitis, gastritis, gastroenteritis, GI bleed, peptic ulcer disease   Co morbidities that complicate the patient evaluation  Kidney stones, GERD, prior transfusion thought to be related to GI bleed   Additional history obtained:  Additional history obtained from sister at bedside who contributes to history as above External records from outside source obtained and reviewed including prior endoscopy from 11/21/2019 no significant findings   Lab Tests:  I Ordered, and personally interpreted labs.  The pertinent results include: CBC within normal limits including normal H&H.  Hemoccult positive.  Lipase normal.  CMP normal including normal LFTs, normal bili, normal alk phos.  Urinalysis is unremarkable.   Imaging Studies ordered:  I ordered imaging studies including CT abdomen pelvis with contrast, right upper quadrant ultrasound I independently visualized and interpreted imaging which showed gallstones without suggestion of acute cholecystitis I agree with the radiologist interpretation   Consultations  Obtained:  I requested consultation with the ER attending, Dr. Maryan Rued,  and discussed lab and imaging findings as well as pertinent plan - they recommend: P.o. challenge, agrees with plan for Protonix and discharged to follow-up with patient's GI provider.   Problem List / ED Course / Critical interventions / Medication management  61 year old female with history of ulcerative colitis with complaint of generalized abdominal pain with watery stools and vomit which appear black in nature.  No history of peptic ulcer disease, no on NSAID use, found to have mild generalized abdominal discomfort with normal bowel sounds.  CT suggest gallstones.  Lab work is reassuring including normal LFTs, normal lipase, normal alk phos and bili, normal WBC.  However, patient arrived with slightly elevated temperature at  100.9.  CT was followed with ultrasound of the right upper quadrant which suggest gallstones without acute cholecystitis although does have upper limits normal CBD.  H&H stable, vitals stable.  Hemoccult is positive.  Patient is not anticoagulated.  Pain has improved with morphine, patient is tolerating oral fluids.  Plan is to discharge with Zofran and Protonix for patient to follow-up with her primary care provider and see her GI with return to ER precautions. I ordered medication including morphine, Zofran, Protonix, Imodium for vomiting, abdominal pain, diarrhea Reevaluation of the patient after these medicines showed that the patient resolved I have reviewed the patients home medicines and have made adjustments as needed   Social Determinants of Health:  Has PCP and well as GI speciality care for follow up.    Test / Admission - Considered:  Admission considered however vitals are stable, H&H normal, no further vomiting.  Recommend Protonix, GI follow-up with return to ER precautions.         Final Clinical Impression(s) / ED Diagnoses Final diagnoses:  Generalized abdominal pain   Gastrointestinal hemorrhage, unspecified gastrointestinal hemorrhage type  Calculus of gallbladder without cholecystitis without obstruction    Rx / DC Orders ED Discharge Orders          Ordered    pantoprazole (PROTONIX) 40 MG tablet  Daily,   Status:  Discontinued        05/12/22 1805    ondansetron (ZOFRAN-ODT) 4 MG disintegrating tablet  Every 8 hours PRN,   Status:  Discontinued        05/12/22 1805    ondansetron (ZOFRAN-ODT) 4 MG disintegrating tablet  Every 8 hours PRN        05/12/22 1810    pantoprazole (PROTONIX) 40 MG tablet  Daily        05/12/22 1810              Tacy Learn, PA-C 05/12/22 1813    Blanchie Dessert, MD 05/15/22 1732

## 2022-05-12 NOTE — ED Notes (Signed)
Fluid Challenge successful.

## 2022-05-12 NOTE — ED Triage Notes (Signed)
Pt arrives to ED with c/o emesis and diarrhea that started last night. She is experiencing generalized abdominal pain. She notes both vomit and diarrhea and black tinged.

## 2022-05-12 NOTE — Discharge Instructions (Addendum)
Follow-up with your GI provider, call tomorrow to schedule an appointment for this week.  Return to ER for any worsening or concerning symptoms. Protonix as prescribed.

## 2022-05-16 ENCOUNTER — Other Ambulatory Visit: Payer: Self-pay | Admitting: Internal Medicine

## 2022-05-16 DIAGNOSIS — Z1231 Encounter for screening mammogram for malignant neoplasm of breast: Secondary | ICD-10-CM

## 2022-06-15 ENCOUNTER — Observation Stay (HOSPITAL_COMMUNITY)
Admission: EM | Admit: 2022-06-15 | Discharge: 2022-06-17 | Disposition: A | Discharge status: Home / Self Care | Payer: Medicare Other | Attending: Internal Medicine | Admitting: Internal Medicine

## 2022-06-15 ENCOUNTER — Other Ambulatory Visit: Payer: Self-pay

## 2022-06-15 ENCOUNTER — Emergency Department (HOSPITAL_COMMUNITY): Payer: Medicare Other

## 2022-06-15 DIAGNOSIS — D509 Iron deficiency anemia, unspecified: Secondary | ICD-10-CM | POA: Diagnosis not present

## 2022-06-15 DIAGNOSIS — J45909 Unspecified asthma, uncomplicated: Secondary | ICD-10-CM | POA: Insufficient documentation

## 2022-06-15 DIAGNOSIS — K921 Melena: Secondary | ICD-10-CM | POA: Diagnosis present

## 2022-06-15 DIAGNOSIS — K922 Gastrointestinal hemorrhage, unspecified: Principal | ICD-10-CM | POA: Insufficient documentation

## 2022-06-15 DIAGNOSIS — Z79899 Other long term (current) drug therapy: Secondary | ICD-10-CM | POA: Diagnosis not present

## 2022-06-15 DIAGNOSIS — R195 Other fecal abnormalities: Secondary | ICD-10-CM

## 2022-06-15 DIAGNOSIS — D649 Anemia, unspecified: Secondary | ICD-10-CM | POA: Diagnosis present

## 2022-06-15 DIAGNOSIS — Z96653 Presence of artificial knee joint, bilateral: Secondary | ICD-10-CM | POA: Insufficient documentation

## 2022-06-15 DIAGNOSIS — D62 Acute posthemorrhagic anemia: Secondary | ICD-10-CM | POA: Diagnosis not present

## 2022-06-15 DIAGNOSIS — I10 Essential (primary) hypertension: Secondary | ICD-10-CM | POA: Insufficient documentation

## 2022-06-15 DIAGNOSIS — K2940 Chronic atrophic gastritis without bleeding: Secondary | ICD-10-CM

## 2022-06-15 DIAGNOSIS — J069 Acute upper respiratory infection, unspecified: Secondary | ICD-10-CM | POA: Diagnosis not present

## 2022-06-15 DIAGNOSIS — Z1152 Encounter for screening for COVID-19: Secondary | ICD-10-CM | POA: Insufficient documentation

## 2022-06-15 DIAGNOSIS — K449 Diaphragmatic hernia without obstruction or gangrene: Secondary | ICD-10-CM | POA: Diagnosis not present

## 2022-06-15 DIAGNOSIS — R0602 Shortness of breath: Secondary | ICD-10-CM

## 2022-06-15 DIAGNOSIS — K3189 Other diseases of stomach and duodenum: Secondary | ICD-10-CM | POA: Diagnosis not present

## 2022-06-15 LAB — VITAMIN B12: Vitamin B-12: 770 pg/mL (ref 180–914)

## 2022-06-15 LAB — TROPONIN I (HIGH SENSITIVITY)
Troponin I (High Sensitivity): <2 ng/L (ref <18)
Troponin I (High Sensitivity): <2 ng/L (ref <18)

## 2022-06-15 LAB — FOLATE: Folate: 33.2 ng/mL (ref >5.9)

## 2022-06-15 LAB — BASIC METABOLIC PANEL
Anion gap: 6 (ref 5–15)
BUN: 15 mg/dL (ref 8–23)
CO2: 26 mmol/L (ref 22–32)
Calcium: 9.1 mg/dL (ref 8.9–10.3)
Chloride: 109 mmol/L (ref 98–111)
Creatinine, Ser: 0.76 mg/dL (ref 0.44–1.00)
GFR, Estimated: >60 mL/min (ref >60)
Glucose, Bld: 102 mg/dL — ABNORMAL HIGH (ref 70–99)
Potassium: 4.4 mmol/L (ref 3.5–5.1)
Sodium: 141 mmol/L (ref 135–145)

## 2022-06-15 LAB — RETICULOCYTES
Immature Retic Fract: 28.9 % — ABNORMAL HIGH (ref 2.3–15.9)
RBC.: 3.11 MIL/uL — ABNORMAL LOW (ref 3.87–5.11)
Retic Count, Absolute: 199 10*3/uL — ABNORMAL HIGH (ref 19.0–186.0)
Retic Ct Pct: 6.4 % — ABNORMAL HIGH (ref 0.4–3.1)

## 2022-06-15 LAB — CBC
HCT: 29.1 % — ABNORMAL LOW (ref 36.0–46.0)
Hemoglobin: 8.3 g/dL — ABNORMAL LOW (ref 12.0–15.0)
MCH: 26.6 pg (ref 26.0–34.0)
MCHC: 28.5 g/dL — ABNORMAL LOW (ref 30.0–36.0)
MCV: 93.3 fL (ref 80.0–100.0)
Platelets: 263 10*3/uL (ref 150–400)
RBC: 3.12 MIL/uL — ABNORMAL LOW (ref 3.87–5.11)
RDW: 17.8 % — ABNORMAL HIGH (ref 11.5–15.5)
WBC: 4.2 10*3/uL (ref 4.0–10.5)
nRBC: 0 % (ref 0.0–0.2)

## 2022-06-15 LAB — IRON AND TIBC
Iron: 14 ug/dL — ABNORMAL LOW (ref 28–170)
Saturation Ratios: 4 % — ABNORMAL LOW (ref 10.4–31.8)
TIBC: 393 ug/dL (ref 250–450)
UIBC: 379 ug/dL

## 2022-06-15 LAB — D-DIMER, QUANTITATIVE: D-Dimer, Quant: 0.34 ug/mL-FEU (ref 0.00–0.50)

## 2022-06-15 LAB — FERRITIN: Ferritin: 12 ng/mL (ref 11–307)

## 2022-06-15 LAB — RESP PANEL BY RT-PCR (FLU A&B, COVID) ARPGX2
Influenza A by PCR: NEGATIVE
Influenza B by PCR: NEGATIVE
SARS Coronavirus 2 by RT PCR: NEGATIVE

## 2022-06-15 LAB — POC OCCULT BLOOD, ED: Fecal Occult Bld: NEGATIVE

## 2022-06-15 MED ORDER — LIFITEGRAST 5 % OP SOLN
1.0000 [drp] | Freq: Two times a day (BID) | OPHTHALMIC | Status: DC
  Administered 2022-06-15 – 2022-06-17 (×3): 1 [drp] via OPHTHALMIC

## 2022-06-15 MED ORDER — ROSUVASTATIN CALCIUM 10 MG PO TABS
10.0000 mg | ORAL_TABLET | Freq: Every day | ORAL | Status: DC
  Administered 2022-06-17: 10 mg via ORAL
  Filled 2022-06-15 (×2): qty 1

## 2022-06-15 MED ORDER — ALBUTEROL SULFATE HFA 108 (90 BASE) MCG/ACT IN AERS: 2.0000 | INHALATION_SPRAY | RESPIRATORY_TRACT | Status: DC | PRN

## 2022-06-15 MED ORDER — GABAPENTIN 400 MG PO CAPS
800.0000 mg | ORAL_CAPSULE | Freq: Every day | ORAL | Status: DC
  Administered 2022-06-15 – 2022-06-16 (×2): 800 mg via ORAL
  Filled 2022-06-15 (×2): qty 2

## 2022-06-15 MED ORDER — PANTOPRAZOLE INFUSION (NEW) - SIMPLE MED
8.0000 mg/h | INTRAVENOUS | Status: DC
  Administered 2022-06-15 – 2022-06-17 (×4): 8 mg/h via INTRAVENOUS
  Filled 2022-06-15 (×2): qty 80
  Filled 2022-06-15: qty 100
  Filled 2022-06-15 (×2): qty 80
  Filled 2022-06-15: qty 100

## 2022-06-15 MED ORDER — PANTOPRAZOLE 80MG IVPB - SIMPLE MED
80.0000 mg | Freq: Once | INTRAVENOUS | Status: AC
  Administered 2022-06-15: 80 mg via INTRAVENOUS
  Filled 2022-06-15: qty 80

## 2022-06-15 MED ORDER — ONDANSETRON HCL 4 MG PO TABS: 4.0000 mg | ORAL_TABLET | Freq: Four times a day (QID) | ORAL | Status: DC | PRN

## 2022-06-15 MED ORDER — PANTOPRAZOLE SODIUM 40 MG IV SOLR: 40.0000 mg | Freq: Two times a day (BID) | INTRAVENOUS | Status: DC

## 2022-06-15 MED ORDER — TRAZODONE HCL 50 MG PO TABS
50.0000 mg | ORAL_TABLET | Freq: Every day | ORAL | Status: DC
  Administered 2022-06-15 – 2022-06-16 (×2): 50 mg via ORAL
  Filled 2022-06-15 (×2): qty 1

## 2022-06-15 MED ORDER — FERROUS SULFATE 325 (65 FE) MG PO TABS
325.0000 mg | ORAL_TABLET | ORAL | Status: DC
  Filled 2022-06-15: qty 1

## 2022-06-15 MED ORDER — ONDANSETRON HCL 4 MG/2ML IJ SOLN: 4.0000 mg | Freq: Four times a day (QID) | INTRAMUSCULAR | Status: DC | PRN

## 2022-06-15 MED ORDER — VITAMIN E 45 MG (100 UNIT) PO CAPS
400.0000 [IU] | ORAL_CAPSULE | Freq: Every day | ORAL | Status: DC
  Administered 2022-06-17: 400 [IU] via ORAL
  Filled 2022-06-15: qty 1
  Filled 2022-06-15: qty 4

## 2022-06-15 NOTE — ED Provider Triage Note (Signed)
Emergency Medicine Provider Triage Evaluation Note  [name] [name] , a 61 y.o. female  was evaluated in triage.  Pt complains of chest pain and SOB starting at 10:45am this morning while getting ready for church. Was brushing her hair and suddenly got winded. No cardiac hx. No hx of blood clots. Has GI appt tomorrow for colonoscopy after having blood in her stool recently.   Review of Systems  Positive: CP, SOB Negative: Fever, chills, cough  Physical Exam  BP (!) 141/80 (BP Location: Left Arm)   Pulse 72   Temp 98.1 F (36.7 C) (Oral)   Resp 18   LMP 08/25/2011   SpO2 99%  Gen:   Awake, no distress   Resp:  Normal effort  MSK:   Moves extremities without difficulty  Other:    Medical Decision Making  Medically screening exam initiated at 12:42 PM.  Appropriate orders placed.  [name] [name] was informed that the remainder of the evaluation will be completed by another provider, this initial triage assessment does not replace that evaluation, and the importance of remaining in the ED until their evaluation is complete.    Kateri Plummer, PA-C 06/15/22 1243

## 2022-06-15 NOTE — Assessment & Plan Note (Addendum)
ABLA due to GIB Type and screen H/H Q6H Tele monitor Transfuse if < 7 On rather low dose of iron at home (half a tab every 3 days) Normocytic and normochromic anemia, likely just ABLA But ill check an anemia panel to see where her iron numbers are at

## 2022-06-15 NOTE — H&P (Signed)
History and Physical    Patient: [name] [name]:[id number] DOB: 1961-07-12 DOA: 06/15/2022 DOS: the patient was seen and examined on 06/15/2022 PCP: Adaline Sill, NP  Patient coming from: Home  Chief Complaint:  Chief Complaint  Patient presents with   Shortness of Breath   HPI: [name] [name] is a 61 y.o. female with medical history significant of incidental finding of UC on screening colonoscopy in 2019, no reported recurrence since then, had GIB with melena and anemia back in 2021, upper and lower scope were reportedly unrevealing at that time.  Iron def anemia on chronic low dose iron.  In to ED today with SOB.  She states she has had some shortness of breath since yesterday.  She first noticed it when she was at a craft fair with her sister.  She was walking up a slight incline and noticed that she got winded.  She noticed that she got short of breath again this morning when she was getting ready for church.  She denies any associated chest pain.  She denies any coughing although when I evaluate her in the room, she is having some coughing spells.  She states that that just started when she got back into the room.  She has not been coughing or congested.  No fevers.  No leg swelling.  She does state that she had similar symptoms when her hemoglobin dropped a few years ago.  She states that couple of years ago she had an episode where she was having black stools and her hemoglobin dropped.  She reports that she required a blood transfusion and iron infusions.  She had an endoscopy and colonoscopy at that time that were nonrevealing.  She was diagnosed with ulcerative colitis and started on medication for this.  She has not had any further episodes since that time.  She is followed by [facility] gastroenterology.  She has noticed that she has had some black stools intermittently for the about the last month.  No dizziness.      Review of Systems: As mentioned in the history of  present illness. All other systems reviewed and are negative. Past Medical History:  Diagnosis Date   Allergy    Anemia 07/2017   on meds   Arthritis    Asthma    Black-out (not amnesia) 06/2017   black out spells due to anemia!   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 She has never used smokeless tobacco. She reports that she does not currently use alcohol. She reports that she does not  use drugs.  Allergies  Allergen Reactions   Acetaminophen Other (See Comments)    Very sleepy   Penicillins Hives and Other (See Comments)    FEB 2018 - broke out in hives Has patient had a PCN reaction causing immediate rash, facial/tongue/throat swelling, SOB or lightheadedness with hypotension: Yes Has patient had a PCN reaction causing severe rash involving mucus membranes or skin necrosis: No Has patient had a PCN reaction that required hospitalization: No Has patient had a PCN reaction occurring within the last 10 years: Yes If all of the above answers are "NO", then may proceed with Cephalosporin use.    Topamax [Topiramate] Other (See Comments)    Kidney stones    Family History  Problem  Relation Age of Onset   Hypertension Mother    Diabetes Mellitus II Mother    Hypertension Father    Diabetes Mellitus II Father    Colon polyps Father    Colon cancer Neg Hx    Esophageal cancer Neg Hx    Stomach cancer Neg Hx    Rectal cancer Neg Hx     Prior to Admission medications   Medication Sig Start Date End Date Taking? Authorizing Provider  cetirizine (ZYRTEC) 10 MG tablet Take 10 mg by mouth at bedtime.   Yes [provider]  esomeprazole (NEXIUM) 10 MG packet Take 40 mg by mouth daily before breakfast.   Yes [provider]  ferrous sulfate 325 (65 FE) MG tablet Take 162.5 mg by mouth See admin instructions. Every 3 days   Yes [provider]  furosemide (LASIX) 20 MG tablet Take 40 mg by mouth as needed for fluid or edema.   Yes [provider]  gabapentin (NEURONTIN) 400 MG capsule Take 800 mg by mouth at bedtime.    Yes [provider]  rosuvastatin (CRESTOR) 10 MG tablet Take 10 mg by mouth daily. 05/23/22  Yes [provider]  traZODone (DESYREL) 100 MG tablet Take 50 mg by mouth at bedtime. 05/06/19  Yes [provider]  Turmeric (QC TUMERIC COMPLEX) 500 MG CAPS Take 500 mg by mouth daily.   Yes [provider]  vitamin E 400 UNIT capsule Take 400 Units by mouth daily.   Yes [provider]  XIIDRA 5 % SOLN Apply 1 drop to eye 2 (two) times daily. 05/27/22  Yes [provider]  pantoprazole (PROTONIX) 40 MG tablet Take 1 tablet (40 mg total) by mouth daily. Patient not taking: Reported on 06/15/2022 05/12/22 06/15/22  Tacy Learn, PA-C    Physical Exam: Vitals:   06/15/22 1139 06/15/22 1242 06/15/22 1930  BP: (!) 141/80  133/62  Pulse: 72  83  Resp: 18  18  Temp: 98.1 F (36.7 C)  97.8 F (36.6 C)  TempSrc: Oral    SpO2: 99%  100%  Weight:  86.2 kg   Height:  5' 4"  (1.626 m)    Constitutional: NAD, calm, comfortable Eyes: PERRL, lids and conjunctivae normal ENMT:  Mucous membranes are moist. Posterior pharynx clear of any exudate or lesions.Normal dentition.  Neck: normal, supple, no masses, no thyromegaly Respiratory: clear to auscultation bilaterally, no wheezing, no crackles. Normal respiratory effort. No accessory muscle use.  Cardiovascular: Regular rate and rhythm, no murmurs / rubs / gallops. No extremity edema. 2+ pedal pulses. No carotid bruits.  Abdomen: no tenderness, no masses palpated. No hepatosplenomegaly. Bowel sounds positive.  Musculoskeletal: no clubbing / cyanosis. No joint deformity upper and lower extremities. Good  ROM, no contractures. Normal muscle tone.  Skin: no rashes, lesions, ulcers. No induration Neurologic: CN 2-12 grossly intact. Sensation intact, DTR normal. Strength 5/5 in all 4.  Psychiatric: Normal judgment and insight. Alert and oriented x 3. Normal mood.   Data Reviewed:       Latest Ref Rng & Units 06/15/2022   12:51 PM 05/12/2022   12:11 PM 03/03/2021   11:21 AM  CBC  WBC 4.0 - 10.5 K/uL 4.2  10.3  9.4   Hemoglobin 12.0 - 15.0 g/dL 8.3  12.2  15.2   Hematocrit 36.0 - 46.0 % 29.1  38.6  47.0   Platelets 150 - 400 K/uL 263  159  201        Latest Ref Rng & Units 06/15/2022   12:51 PM 05/12/2022   12:11 PM 03/03/2021   11:21 AM  CMP  Glucose 70 - 99 mg/dL 102  120  103   BUN 8 - 23 mg/dL 15  18  7    Creatinine 0.44 - 1.00 mg/dL 0.76  0.81  0.82   Sodium 135 - 145 mmol/L 141  139  138   Potassium 3.5 - 5.1 mmol/L 4.4  4.9  3.9   Chloride 98 - 111 mmol/L 109  103  101   CO2 22 - 32 mmol/L 26  25  29    Calcium 8.9 - 10.3 mg/dL 9.1  9.4  9.7   Total Protein 6.5 - 8.1 g/dL  7.7    Total Bilirubin 0.3 - 1.2 mg/dL  0.5    Alkaline Phos 38 - 126 U/L  108    AST 15 - 41 U/L  31    ALT 0 - 44 U/L  12     CXR neg  Assessment and Plan: * Gastrointestinal hemorrhage with melena Intermittent melena over past month, and HGB drop from 12.2 to 8.3 in the 1 month time frame. PPI GTT Tele monitor Clear liquid  diet H/H Q6H Message sent to GI for AM consult NPO after MN per Dr. Rush Landmark  Symptomatic anemia ABLA due to GIB Type and screen H/H Q6H Tele monitor Transfuse if < 7 On rather low dose of iron at home (half a tab every 3 days) Normocytic and normochromic anemia, likely just ABLA But ill check an anemia panel to see where her iron numbers are at  URI (upper respiratory infection) New onset cough and URI symptoms today in ED. COVID and Flu tests pending CXR neg      Advance Care Planning:   Code Status: Full Code  Consults: Secure chat sent to Dr. Rush Landmark  Family Communication: No family in room  Severity of Illness: The appropriate patient status for this patient is OBSERVATION. Observation status is judged to be reasonable and necessary in order to provide the required intensity of service to ensure the patient's safety. The patient's presenting symptoms, physical exam findings, and initial radiographic and laboratory data in the context of their medical condition is felt to place them at decreased risk for further clinical deterioration. Furthermore, it is anticipated that the patient will be medically stable for discharge from the hospital within 2 midnights of admission.   Author: Etta Quill., DO 06/15/2022 8:32 PM  For on call review [email or web address].

## 2022-06-15 NOTE — ED Triage Notes (Signed)
Patient reports this morning around 1045 she experienced an episode of shobr while getting ready for church. Endorses pain in rib/abdomen/chest and shoulders. Patient states she feels fine in triage, no pain Says she has also felt tired lately.

## 2022-06-15 NOTE — ED Provider Notes (Signed)
[facility] DEPT Provider Note   CSN: [id number] Arrival date & time: 06/15/22  1134     History  Chief Complaint  Patient presents with   Shortness of Breath    [name] [name] is a 61 y.o. female.  Patient is a 61 year old female who presents with shortness of breath.  She states she has had some shortness of breath since yesterday.  She first noticed it when she was at a craft fair with her sister.  She was walking up a slight incline and noticed that she got winded.  She noticed that she got short of breath again this morning when she was getting ready for church.  She denies any associated chest pain.  She denies any coughing although when I evaluate her in the room, she is having some coughing spells.  She states that that just started when she got back into the room.  She has not been coughing or congested.  No fevers.  No leg swelling.  She does state that she had similar symptoms when her hemoglobin dropped a few years ago.  She states that couple of years ago she had an episode where she was having black stools and her hemoglobin dropped.  She reports that she required a blood transfusion and iron infusions.  She had an endoscopy and colonoscopy at that time that were nonrevealing.  She was diagnosed with ulcerative colitis and started on medication for this.  She has not had any further episodes since that time.  She is followed by [facility] gastroenterology.  She has noticed that she has had some black stools intermittently for the about the last month.  No dizziness.       Home Medications Prior to Admission medications   Medication Sig Start Date End Date Taking? Authorizing Provider  cefdinir (OMNICEF) 300 MG capsule Take 1 capsule (300 mg total) by mouth 2 (two) times daily. 03/03/21   Lajean Saver, MD  cetirizine (ZYRTEC) 10 MG tablet Take 10 mg by mouth at bedtime.    [provider]  esomeprazole (NEXIUM) 10 MG packet Take 10 mg by  mouth 2 (two) times daily.    [provider]  ferrous sulfate 325 (65 FE) MG tablet Take 162.5 mg by mouth. Every 3 days    [provider]  furosemide (LASIX) 20 MG tablet Take 40 mg by mouth as needed.     [provider]  gabapentin (NEURONTIN) 400 MG capsule Take 800 mg by mouth at bedtime.     [provider]  metoprolol tartrate (LOPRESSOR) 25 MG tablet Take 25 mg by mouth 2 (two) times daily. 10/09/19   [provider]  Na Sulfate-K Sulfate-Mg Sulf (SUPREP BOWEL PREP KIT) 17.5-3.13-1.6 GM/177ML SOLN Suprep as directed, no substitutions 11/03/19   Nandigam, Venia Minks, MD  ondansetron (ZOFRAN-ODT) 4 MG disintegrating tablet Take 1 tablet (4 mg total) by mouth every 8 (eight) hours as needed for nausea or vomiting. 05/12/22   Tacy Learn, PA-C  pantoprazole (PROTONIX) 40 MG tablet Take 1 tablet (40 mg total) by mouth daily. 05/12/22 06/11/22  Tacy Learn, PA-C  Polyvinyl Alcohol-Povidone (REFRESH OP) Apply to eye. PRN    [provider]  POTASSIUM PO Take by mouth daily.    [provider]  PROAIR RESPICLICK 696 (90 Base) MCG/ACT AEPB Inhale 1 puff into the lungs every 4 (four) hours as needed. 10/09/19   [provider]  traZODone (DESYREL) 100 MG tablet Take  50 mg by mouth at bedtime. 05/06/19   [provider]  vitamin E 400 UNIT capsule Take 400 Units by mouth daily.    [provider]      Allergies    Acetaminophen, Penicillins, and Topamax [topiramate]    Review of Systems   Review of Systems  Constitutional:  Negative for chills, diaphoresis, fatigue and fever.  HENT:  Negative for congestion, rhinorrhea and sneezing.   Eyes: Negative.   Respiratory:  Positive for shortness of breath. Negative for cough and chest tightness.   Cardiovascular:  Negative for chest pain and leg swelling.  Gastrointestinal:  Negative for abdominal pain, blood in stool, diarrhea, nausea and vomiting.       Melena   Genitourinary:  Negative for difficulty urinating, flank pain, frequency and hematuria.  Musculoskeletal:  Negative for arthralgias and back pain.  Skin:  Negative for rash.  Neurological:  Negative for dizziness, speech difficulty, weakness, numbness and headaches.    Physical Exam Updated Vital Signs BP 133/62   Pulse 83   Temp 97.8 F (36.6 C)   Resp 18   Ht _0  (1.626 m)   Wt 86.2 kg   LMP 08/25/2011   SpO2 100%   BMI 32.61 kg/m  Physical Exam Constitutional:      Appearance: She is well-developed.  HENT:     Head: Normocephalic and atraumatic.  Eyes:     Pupils: Pupils are equal, round, and reactive to light.  Cardiovascular:     Rate and Rhythm: Normal rate and regular rhythm.     Heart sounds: Normal heart sounds.  Pulmonary:     Effort: Pulmonary effort is normal. No respiratory distress.     Breath sounds: Wheezing (Few scarce wheezes in the right lung base) present. No rales.  Chest:     Chest wall: No tenderness.  Abdominal:     General: Bowel sounds are normal.     Palpations: Abdomen is soft.     Tenderness: There is no abdominal tenderness. There is no guarding or rebound.  Genitourinary:    Comments: No significant stool noted on rectal exam.  Hemoccult sent Musculoskeletal:        General: Normal range of motion.     Cervical back: Normal range of motion and neck supple.  Lymphadenopathy:     Cervical: No cervical adenopathy.  Skin:    General: Skin is warm and dry.     Findings: No rash.  Neurological:     Mental Status: She is alert and oriented to person, place, and time.     ED Results / Procedures / Treatments   Labs (all labs ordered are listed, but only abnormal results are displayed) Labs Reviewed  BASIC METABOLIC PANEL - Abnormal; Notable for the following components:      Result Value   Glucose, Bld 102 (*)    All other components within normal limits  CBC - Abnormal; Notable for the following components:   RBC 3.12 (*)     Hemoglobin 8.3 (*)    HCT 29.1 (*)    MCHC 28.5 (*)    RDW 17.8 (*)    All other components within normal limits  RESP PANEL BY RT-PCR (FLU A&B, COVID) ARPGX2  D-DIMER, QUANTITATIVE  POC OCCULT BLOOD, ED  TYPE AND SCREEN  TROPONIN I (HIGH SENSITIVITY)  TROPONIN I (HIGH SENSITIVITY)    EKG EKG Interpretation  Date/Time:  Sunday June 15 2022 12:03:22 EST Ventricular Rate:  67 PR Interval:  154 QRS Duration: 68 QT Interval:  384 QTC Calculation: 405 R Axis:   15 Text Interpretation: Normal sinus rhythm Normal ECG No previous ECGs available Confirmed by Malvin Johns [phone]) on 06/15/2022 5:49:08 PM  Radiology DG Chest 2 View  Result Date: 06/15/2022 CLINICAL DATA:  Patient reports this morning around 1045 she experienced an episode of SOB while getting ready for church. Endorses pain in rib/abdomen/chest and shoulders. Patient states she feels fine in triage, no pain. Says she has also felt tired lately. EXAM: CHEST - 2 VIEW COMPARISON:  06/14/2019.  CT could and chest radiograph. FINDINGS: Cardiac silhouette normal in size. Moderate size hiatal hernia. No mediastinal or hilar masses or evidence of adenopathy. Clear lungs.  No pleural effusion or pneumothorax. Skeletal structures are intact. IMPRESSION: No active cardiopulmonary disease. Electronically Signed   By: Lajean Manes M.D.   On: 06/15/2022 13:36    Procedures Procedures    Medications Ordered in ED Medications  albuterol (VENTOLIN HFA) 108 (90 Base) MCG/ACT inhaler 2 puff (has no administration in time range)  pantoprazole (PROTONIX) 80 mg /NS 100 mL IVPB (has no administration in time range)  pantoprozole (PROTONIX) 80 mg /NS 100 mL infusion (has no administration in time range)  pantoprazole (PROTONIX) injection 40 mg (has no administration in time range)    ED Course/ Medical Decision Making/ A&P                           Medical Decision Making Amount and/or Complexity of Data Reviewed Labs:  ordered.  Risk Prescription drug management. Decision regarding hospitalization.   Patient is a 61 year old female who presents with shortness of breath.  No cough or chest congestion.  Incidentally, she was coughing when I was talking to her in the room but she said that literally just started when she got in the room.  She has not had an ongoing cough.  No rhinorrhea or other URI type symptoms.  On exam her lungs had very scarce wheezing.  She has had this before but has not been officially diagnosed with COPD.  Was given albuterol inhaler.  Chest x-ray was performed which showed no pneumonia.  No pneumothorax.  No pulmonary edema.  This was interpreted by me and confirmed by the radiologist.  I suspect her symptoms are more related to her anemia.  Her hemoglobin is 8.3 which is a drop from 12 just a couple weeks ago.  Her blood pressure is stable.  Her other labs are nonconcerning.  She does report melena.  There was not really any stool on my rectal exam but it was Hemoccult negative.  Feel that she needs to be admitted for monitoring of her hemoglobin levels and possible GI consult.  I spoke with Dr. Alcario Drought who will admit the patient for further treatment.  Final Clinical Impression(s) / ED Diagnoses Final diagnoses:  Iron deficiency anemia, unspecified iron deficiency anemia type  Shortness of breath    Rx / DC Orders ED Discharge Orders     None         Malvin Johns, MD 06/15/22 1953

## 2022-06-15 NOTE — Assessment & Plan Note (Addendum)
Intermittent melena over past month, and HGB drop from 12.2 to 8.3 in the 1 month time frame. PPI GTT Tele monitor Clear liquid diet H/H Q6H Message sent to GI for AM consult NPO after MN per Dr. Rush Landmark

## 2022-06-15 NOTE — Assessment & Plan Note (Signed)
New onset cough and URI symptoms today in ED. COVID and Flu tests pending CXR neg

## 2022-06-16 ENCOUNTER — Ambulatory Visit: Payer: Medicare Other | Admitting: Physician Assistant

## 2022-06-16 ENCOUNTER — Encounter (HOSPITAL_COMMUNITY)
Admission: EM | Disposition: A | Discharge status: Home / Self Care | Payer: Self-pay | Source: Home / Self Care | Attending: Emergency Medicine

## 2022-06-16 ENCOUNTER — Observation Stay (HOSPITAL_BASED_OUTPATIENT_CLINIC_OR_DEPARTMENT_OTHER): Payer: Medicare Other | Admitting: Anesthesiology

## 2022-06-16 ENCOUNTER — Encounter (HOSPITAL_COMMUNITY): Payer: Self-pay | Admitting: Internal Medicine

## 2022-06-16 ENCOUNTER — Observation Stay (HOSPITAL_COMMUNITY): Payer: Medicare Other | Admitting: Anesthesiology

## 2022-06-16 DIAGNOSIS — E669 Obesity, unspecified: Secondary | ICD-10-CM | POA: Diagnosis not present

## 2022-06-16 DIAGNOSIS — D500 Iron deficiency anemia secondary to blood loss (chronic): Secondary | ICD-10-CM

## 2022-06-16 DIAGNOSIS — D509 Iron deficiency anemia, unspecified: Secondary | ICD-10-CM | POA: Diagnosis not present

## 2022-06-16 DIAGNOSIS — R195 Other fecal abnormalities: Secondary | ICD-10-CM

## 2022-06-16 DIAGNOSIS — K3189 Other diseases of stomach and duodenum: Secondary | ICD-10-CM | POA: Diagnosis not present

## 2022-06-16 DIAGNOSIS — K449 Diaphragmatic hernia without obstruction or gangrene: Secondary | ICD-10-CM

## 2022-06-16 DIAGNOSIS — Z6832 Body mass index (BMI) 32.0-32.9, adult: Secondary | ICD-10-CM

## 2022-06-16 DIAGNOSIS — K921 Melena: Secondary | ICD-10-CM | POA: Diagnosis not present

## 2022-06-16 DIAGNOSIS — D62 Acute posthemorrhagic anemia: Secondary | ICD-10-CM | POA: Diagnosis not present

## 2022-06-16 DIAGNOSIS — K922 Gastrointestinal hemorrhage, unspecified: Secondary | ICD-10-CM | POA: Diagnosis not present

## 2022-06-16 DIAGNOSIS — D649 Anemia, unspecified: Secondary | ICD-10-CM | POA: Diagnosis not present

## 2022-06-16 HISTORY — PX: BIOPSY: SHX5522

## 2022-06-16 HISTORY — PX: ESOPHAGOGASTRODUODENOSCOPY (EGD) WITH PROPOFOL: SHX5813

## 2022-06-16 LAB — BASIC METABOLIC PANEL
Anion gap: 8 (ref 5–15)
BUN: 13 mg/dL (ref 8–23)
CO2: 23 mmol/L (ref 22–32)
Calcium: 8.7 mg/dL — ABNORMAL LOW (ref 8.9–10.3)
Chloride: 108 mmol/L (ref 98–111)
Creatinine, Ser: 0.74 mg/dL (ref 0.44–1.00)
GFR, Estimated: >60 mL/min (ref >60)
Glucose, Bld: 89 mg/dL (ref 70–99)
Potassium: 3.6 mmol/L (ref 3.5–5.1)
Sodium: 139 mmol/L (ref 135–145)

## 2022-06-16 LAB — HEMOGLOBIN AND HEMATOCRIT, BLOOD
HCT: 26.8 % — ABNORMAL LOW (ref 36.0–46.0)
HCT: 27.5 % — ABNORMAL LOW (ref 36.0–46.0)
HCT: 28.4 % — ABNORMAL LOW (ref 36.0–46.0)
Hemoglobin: 7.7 g/dL — ABNORMAL LOW (ref 12.0–15.0)
Hemoglobin: 7.9 g/dL — ABNORMAL LOW (ref 12.0–15.0)
Hemoglobin: 8.3 g/dL — ABNORMAL LOW (ref 12.0–15.0)

## 2022-06-16 LAB — PREPARE RBC (CROSSMATCH)

## 2022-06-16 LAB — HIV ANTIBODY (ROUTINE TESTING W REFLEX): HIV Screen 4th Generation wRfx: NONREACTIVE

## 2022-06-16 SURGERY — ESOPHAGOGASTRODUODENOSCOPY (EGD) WITH PROPOFOL
Anesthesia: Monitor Anesthesia Care

## 2022-06-16 MED ORDER — ALBUTEROL SULFATE (2.5 MG/3ML) 0.083% IN NEBU: 2.5000 mg | INHALATION_SOLUTION | RESPIRATORY_TRACT | Status: DC | PRN

## 2022-06-16 MED ORDER — PROPOFOL 500 MG/50ML IV EMUL
INTRAVENOUS | Status: DC | PRN
  Administered 2022-06-16: 125 ug/kg/min via INTRAVENOUS

## 2022-06-16 MED ORDER — SODIUM CHLORIDE 0.9% IV SOLUTION: Freq: Once | INTRAVENOUS | Status: DC

## 2022-06-16 MED ORDER — PHENYLEPHRINE 80 MCG/ML (10ML) SYRINGE FOR IV PUSH (FOR BLOOD PRESSURE SUPPORT)
PREFILLED_SYRINGE | INTRAVENOUS | Status: DC | PRN
  Administered 2022-06-16 (×2): 160 ug via INTRAVENOUS

## 2022-06-16 MED ORDER — SODIUM CHLORIDE 0.9 % IV SOLN
250.0000 mg | Freq: Every day | INTRAVENOUS | Status: AC
  Administered 2022-06-16 – 2022-06-17 (×2): 250 mg via INTRAVENOUS
  Filled 2022-06-16 (×2): qty 20

## 2022-06-16 MED ORDER — PROPOFOL 10 MG/ML IV BOLUS
INTRAVENOUS | Status: DC | PRN
  Administered 2022-06-16: 30 mg via INTRAVENOUS

## 2022-06-16 MED ORDER — LACTATED RINGERS IV SOLN
INTRAVENOUS | Status: AC | PRN
  Administered 2022-06-16: 1000 mL via INTRAVENOUS

## 2022-06-16 MED ORDER — PROPOFOL 1000 MG/100ML IV EMUL
INTRAVENOUS | Status: AC
  Filled 2022-06-16: qty 100

## 2022-06-16 SURGICAL SUPPLY — 15 items

## 2022-06-16 NOTE — Interval H&P Note (Signed)
History and Physical Interval Note:  06/16/2022 1:57 PM  [name] [name]  has presented today for surgery, with the diagnosis of iron deficiency anemia, recent melena.  The various methods of treatment have been discussed with the patient and family. After consideration of risks, benefits and other options for treatment, the patient has consented to  Procedure(s): ESOPHAGOGASTRODUODENOSCOPY (EGD) WITH PROPOFOL (N/A) as a surgical intervention.  The patient's history has been reviewed, patient examined, no change in status, stable for surgery.  I have reviewed the patient's chart and labs.  Questions were answered to the patient's satisfaction.     Pricilla Riffle. Fuller Plan

## 2022-06-16 NOTE — Progress Notes (Signed)
PROGRESS NOTE    [name] [name]  MGQ:[id number] DOB: 1961/03/28 DOA: 06/15/2022 PCP: Adaline Sill, NP    Brief Narrative:  61 year old female admitted to the hospital with dyspnea on exertion.  She did admit to noticing intermittent melena for the past month.  Hemoglobin at baseline 12.2, down to 7.9 during her hospitalization.  Seen by GI and underwent EGD that showed hiatal hernia with Lysbeth Galas erosions.  She is being transfused PRBC as well as iron infusions.  Continued on PPI twice daily.   Assessment & Plan:   Principal Problem:   Gastrointestinal hemorrhage with melena Active Problems:   Symptomatic anemia   ABLA (acute blood loss anemia)   URI (upper respiratory infection)   Occult blood in stools   Gastrointestinal bleed with melena -Reportedly having intermittent episodes of melena over the past month -She underwent EGD today that showed hiatal hernia with Lysbeth Galas erosions -This was felt to be the likely source of her anemia -Recommendations are for PPI twice daily for 2 weeks and then daily long-term -Soft diet  Acute blood loss anemia Iron deficiency anemia -She has reported blood loss over the past month with melena -Iron study shows iron deficiency -Hemoglobin has dropped from 12.2-7.7 in the past month -She is having significant symptoms with dyspnea on exertion -1 unit of PRBC ordered today to keep hemoglobin above 8 -She also received intravenous iron  Hyperlipidemia -Continue statin   DVT prophylaxis: SCDs Start: 06/15/22 2010  Code Status: Full code Family Communication: Discussed with patient Disposition Plan: Status is: Observation The patient remains OBS appropriate and will d/c before 2 midnights.     Consultants:  GI  Procedures:  EGD  Antimicrobials:      Subjective: She reported feeling short of breath this morning on exertion.  Objective: Vitals:   06/16/22 1520 06/16/22 1530 06/16/22 1611 06/16/22 1614  BP: (!)  115/41 (!) 115/48 (!) 146/75   Pulse: 71 66 89   Resp: 19 (!) 22 (!) 21   Temp:   98.3 F (36.8 C)   TempSrc:   Oral   SpO2: 99% 97% 99%   Weight:    82.4 kg  Height:    5' 4"  (1.626 m)    Intake/Output Summary (Last 24 hours) at 06/16/2022 1935 Last data filed at 06/16/2022 1442 Gross per 24 hour  Intake 699.53 ml  Output --  Net 699.53 ml   Filed Weights   06/15/22 1242 06/16/22 1614  Weight: 86.2 kg 82.4 kg    Examination:  General exam: Appears calm and comfortable  Respiratory system: Clear to auscultation. Respiratory effort normal. Cardiovascular system: S1 & S2 heard, RRR. No JVD, murmurs, rubs, gallops or clicks. No pedal edema. Gastrointestinal system: Abdomen is nondistended, soft and nontender. No organomegaly or masses felt. Normal bowel sounds heard. Central nervous system: Alert and oriented. No focal neurological deficits. Extremities: Symmetric 5 x 5 power. Skin: No rashes, lesions or ulcers Psychiatry: Judgement and insight appear normal. Mood & affect appropriate.     Data Reviewed: I have personally reviewed following labs and imaging studies  CBC: Recent Labs  Lab 06/15/22 1251 06/16/22 0000 06/16/22 0500 06/16/22 1308  WBC 4.2  --   --   --   HGB 8.3* 8.3* 7.7* 7.9*  HCT 29.1* 28.4* 26.8* 27.5*  MCV 93.3  --   --   --   PLT 263  --   --   --    Basic Metabolic Panel: Recent Labs  Lab 06/15/22 1251 06/16/22 0500  NA 141 139  K 4.4 3.6  CL 109 108  CO2 26 23  GLUCOSE 102* 89  BUN 15 13  CREATININE 0.76 0.74  CALCIUM 9.1 8.7*   GFR: Estimated Creatinine Clearance: 76.7 mL/min (by C-G formula based on SCr of 0.74 mg/dL). Liver Function Tests: No results for input(s): "AST", "ALT", "ALKPHOS", "BILITOT", "PROT", "ALBUMIN" in the last 168 hours. No results for input(s): "LIPASE", "AMYLASE" in the last 168 hours. No results for input(s): "AMMONIA" in the last 168 hours. Coagulation Profile: No results for input(s): "INR", "PROTIME"  in the last 168 hours. Cardiac Enzymes: No results for input(s): "CKTOTAL", "CKMB", "CKMBINDEX", "TROPONINI" in the last 168 hours. BNP (last 3 results) No results for input(s): "PROBNP" in the last 8760 hours. HbA1C: No results for input(s): "HGBA1C" in the last 72 hours. CBG: No results for input(s): "GLUCAP" in the last 168 hours. Lipid Profile: No results for input(s): "CHOL", "HDL", "LDLCALC", "TRIG", "CHOLHDL", "LDLDIRECT" in the last 72 hours. Thyroid Function Tests: No results for input(s): "TSH", "T4TOTAL", "FREET4", "T3FREE", "THYROIDAB" in the last 72 hours. Anemia Panel: Recent Labs    06/15/22 1251 06/15/22 2022  VITAMINB12  --  770  FOLATE  --  33.2  FERRITIN  --  12  TIBC  --  393  IRON  --  14*  RETICCTPCT 6.4*  --    Sepsis Labs: No results for input(s): "PROCALCITON", "LATICACIDVEN" in the last 168 hours.  Recent Results (from the past 240 hour(s))  Resp Panel by RT-PCR (Flu A&B, Covid) Anterior Nasal Swab     Status: None   Collection Time: 06/15/22  7:48 PM   Specimen: Anterior Nasal Swab  Result Value Ref Range Status   SARS Coronavirus 2 by RT PCR NEGATIVE NEGATIVE Final    Comment: (NOTE) SARS-CoV-2 target nucleic acids are NOT DETECTED.  The SARS-CoV-2 RNA is generally detectable in upper respiratory specimens during the acute phase of infection. The lowest concentration of SARS-CoV-2 viral copies this assay can detect is 138 copies/mL. A negative result does not preclude SARS-Cov-2 infection and should not be used as the sole basis for treatment or other patient management decisions. A negative result may occur with  improper specimen collection/handling, submission of specimen other than nasopharyngeal swab, presence of viral mutation(s) within the areas targeted by this assay, and inadequate number of viral copies(<138 copies/mL). A negative result must be combined with clinical observations, patient history, and epidemiological information.  The expected result is Negative.  Fact Sheet for Patients:  EntrepreneurPulse.com.au  Fact Sheet for Healthcare Providers:  IncredibleEmployment.be  This test is no t yet approved or cleared by the [place] FDA and  has been authorized for detection and/or diagnosis of SARS-CoV-2 by FDA under an Emergency Use Authorization (EUA). This EUA will remain  in effect (meaning this test can be used) for the duration of the COVID-19 declaration under Section 564(b)(1) of the Act, 21 U.S.C.section 360bbb-3(b)(1), unless the authorization is terminated  or revoked sooner.       Influenza A by PCR NEGATIVE NEGATIVE Final   Influenza B by PCR NEGATIVE NEGATIVE Final    Comment: (NOTE) The Xpert Xpress SARS-CoV-2/FLU/RSV plus assay is intended as an aid in the diagnosis of influenza from Nasopharyngeal swab specimens and should not be used as a sole basis for treatment. Nasal washings and aspirates are unacceptable for Xpert Xpress SARS-CoV-2/FLU/RSV testing.  Fact Sheet for Patients: EntrepreneurPulse.com.au  Fact Sheet for Healthcare Providers:  IncredibleEmployment.be  This test is not yet approved or cleared by the [place] and has been authorized for detection and/or diagnosis of SARS-CoV-2 by FDA under an Emergency Use Authorization (EUA). This EUA will remain in effect (meaning this test can be used) for the duration of the COVID-19 declaration under Section 564(b)(1) of the Act, 21 U.S.C. section 360bbb-3(b)(1), unless the authorization is terminated or revoked.  Performed at [facility], [place] [address] [place], [place] [address]          Radiology Studies: DG Chest 2 View  Result Date: 06/15/2022 CLINICAL DATA:  Patient reports this morning around 1045 she experienced an episode of SOB while getting ready for church. Endorses pain in rib/abdomen/chest and  shoulders. Patient states she feels fine in triage, no pain. Says she has also felt tired lately. EXAM: CHEST - 2 VIEW COMPARISON:  06/14/2019.  CT could and chest radiograph. FINDINGS: Cardiac silhouette normal in size. Moderate size hiatal hernia. No mediastinal or hilar masses or evidence of adenopathy. Clear lungs.  No pleural effusion or pneumothorax. Skeletal structures are intact. IMPRESSION: No active cardiopulmonary disease. Electronically Signed   By: Lajean Manes M.D.   On: 06/15/2022 13:36        Scheduled Meds:  sodium chloride   Intravenous Once   gabapentin  800 mg Oral QHS   Lifitegrast  1 drop Ophthalmic BID   [START ON 06/19/2022] pantoprazole  40 mg Intravenous Q12H   rosuvastatin  10 mg Oral Daily   traZODone  50 mg Oral QHS   vitamin E  400 Units Oral Daily   Continuous Infusions:  ferric gluconate (FERRLECIT) IVPB 250 mg (06/16/22 1905)   pantoprazole 8 mg/hr (06/16/22 1628)     LOS: 0 days    Time spent: 29mns    JKathie Dike MD [facility]   If 7PM-7AM, please contact night-coverage [email or web address]  06/16/2022, 7:35 PM

## 2022-06-16 NOTE — H&P (View-Only) (Signed)
Consultation  Referring Provider: [facility]/ [facility] Primary Care Physician:  [name] Sill, NP Primary Gastroenterologist:  Dr. Silverio Decamp   Reason for Consultation:   symptomatic anemia, iron deficiency  HPI: [name] [name] is a 61 y.o. female, known to Dr. Roland Rack who was last seen in our office in spring 2021.  She has history of iron deficiency anemia, requiring prior transfusions and iron infusions. She initially had been seen in 2019 for screening colonoscopy and at that time was found to have scattered mild the erythematous mucosa with erosions in the entire colon.  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  She was treated with Apriso for a period of time but self discontinued this.  She was then seen in March 2021 after episodes of dark stools.  She was found to be anemic again and underwent work-up with EGD in April 2021 which did reveal a large hiatal hernia and otherwise negative exam. Colonoscopy at that time was normal, random biopsies from the right and left colon were both negative. Capsule endoscopy showed 1 small AVM about 25 minutes beyond the first duodenal image, nonbleeding.  Says she had been doing okay over the past couple of years, no anemia to her knowledge, her PCP had reduced her oral iron to every third day she has avoided aspirin and NSAIDs and usually takes 20 mg of Nexium daily for GERD symptoms. She started feeling poorly this weekend, noticing that she was quite short of breath when trying to walk up a small incline.  The shortness of breath persisted and she eventually came to the ED yesterday afternoon.  She denied any chest pain, no abdominal pain, no nausea or vomiting but says that her appetite has been decreased over the past week or so. She has noticed some dark stools off and on but nothing consistent and no bright red blood. She did have an episode about a month ago with what she describes as black emesis and black stool.  She went to urgent  care, had labs done which were unremarkable and hemoglobin at that time 12.2/hematocrit 38.6.  She said that resolved and she did not have any further coffee-ground appearing emesis or dark stool. He says that she knew something was wrong because she felt just like she had a couple of years ago this weekend when she became short of breath.  Work-up in the ER with hemoglobin 8.8/hematocrit 29.1> 7.7 this morning hematocrit 26.8 MCV 93/platelets 263 BUN 15/creatinine 0.76 D-dimer within normal limits/troponin negative Heme negative but had been documented heme +67-monthago B12 and folate within normal limits Ferritin 12/serum iron 14/TIBC 343/iron sat 4  No imaging was done yesterday she did have CT of the abdomen and pelvis done in October with that urgent care visit and was found to have a large hiatal hernia tiny gallstones and some mild gallbladder dilation otherwise negative study..  She has been stable overnight, she has not received any blood, was started on PPI infusion.   Past Medical History:  Diagnosis Date   Allergy    Anemia 07/2017   on meds   Arthritis    Asthma    Black-out (not amnesia) 06/2017   black out spells due to anemia!   Blood transfusion without reported diagnosis    Pt thinks she had 3 units blood in Dec, 2018   GERD (gastroesophageal reflux disease)    History of kidney stones    Kidney stones    Migraine  Restless leg syndrome    Swelling of lower limb    bialteral lower leg swelling    Past Surgical History:  Procedure Laterality Date   COLONOSCOPY     MULTIPLE TOOTH EXTRACTIONS     right eye lid surgery      TOTAL KNEE ARTHROPLASTY Left 01/26/2017   Procedure: LEFT TOTAL KNEE ARTHROPLASTY;  Surgeon: Gaynelle Arabian, MD;  Location: [facility];  Service: Orthopedics;  Laterality: Left;  with block   TOTAL KNEE ARTHROPLASTY Right 10/26/2017   Procedure: RIGHT TOTAL KNEE ARTHROPLASTY;  Surgeon: Gaynelle Arabian, MD;  Location: [facility];  Service:  Orthopedics;  Laterality: Right;   TUBAL LIGATION      Prior to Admission medications   Medication Sig Start Date End Date Taking? Authorizing Provider  cetirizine (ZYRTEC) 10 MG tablet Take 10 mg by mouth at bedtime.   Yes [provider]  esomeprazole (NEXIUM) 10 MG packet Take 40 mg by mouth daily before breakfast.   Yes [provider]  ferrous sulfate 325 (65 FE) MG tablet Take 162.5 mg by mouth See admin instructions. Every 3 days   Yes [provider]  furosemide (LASIX) 20 MG tablet Take 40 mg by mouth as needed for fluid or edema.   Yes [provider]  gabapentin (NEURONTIN) 400 MG capsule Take 800 mg by mouth at bedtime.    Yes [provider]  rosuvastatin (CRESTOR) 10 MG tablet Take 10 mg by mouth daily. 05/23/22  Yes [provider]  traZODone (DESYREL) 100 MG tablet Take 50 mg by mouth at bedtime. 05/06/19  Yes [provider]  Turmeric (QC TUMERIC COMPLEX) 500 MG CAPS Take 500 mg by mouth daily.   Yes [provider]  vitamin E 400 UNIT capsule Take 400 Units by mouth daily.   Yes [provider]  XIIDRA 5 % SOLN Apply 1 drop to eye 2 (two) times daily. 05/27/22  Yes [provider]  pantoprazole (PROTONIX) 40 MG tablet Take 1 tablet (40 mg total) by mouth daily. Patient not taking: Reported on 06/15/2022 05/12/22 06/15/22  Tacy Learn, PA-C    Current Facility-Administered Medications  Medication Dose Route Frequency Provider Last Rate Last Admin   0.9 %  sodium chloride infusion (Manually program via Guardrails IV Fluids)   Intravenous Once Esterwood, Amy S, PA-C       0.9 %  sodium chloride infusion  500 mL Intravenous Once Nandigam, Venia Minks, MD       albuterol (VENTOLIN HFA) 108 (90 Base) MCG/ACT inhaler 2 puff  2 puff Inhalation Q4H PRN Malvin Johns, MD       ferric gluconate (FERRLECIT) 250 mg in sodium chloride 0.9 % 250 mL IVPB  250 mg Intravenous Daily Esterwood, Amy S,  PA-C       gabapentin (NEURONTIN) capsule 800 mg  800 mg Oral QHS Jennette Kettle M, DO   800 mg at 06/15/22 2131   Lifitegrast 5 % SOLN 1 drop  1 drop Ophthalmic BID Etta Quill, DO   1 drop at 06/15/22 2241   ondansetron (ZOFRAN) tablet 4 mg  4 mg Oral Q6H PRN Etta Quill, DO       Or   ondansetron [facility]) injection 4 mg  4 mg Intravenous Q6H PRN Etta Quill, DO       [START ON 06/19/2022] pantoprazole (PROTONIX) injection 40 mg  40 mg Intravenous Q12H Jennette Kettle M, DO       pantoprozole ([place])  80 mg /NS 100 mL infusion  8 mg/hr Intravenous Continuous Etta Quill, DO 10 mL/hr at 06/16/22 0809 8 mg/hr at 06/16/22 0809   rosuvastatin (CRESTOR) tablet 10 mg  10 mg Oral Daily Jennette Kettle M, DO       traZODone (DESYREL) tablet 50 mg  50 mg Oral QHS Jennette Kettle M, DO   50 mg at 06/15/22 2131   Vitamin E CAPS 400 Units  400 Units Oral Daily Etta Quill, DO       Current Outpatient Medications  Medication Sig Dispense Refill   cetirizine (ZYRTEC) 10 MG tablet Take 10 mg by mouth at bedtime.     esomeprazole (NEXIUM) 10 MG packet Take 40 mg by mouth daily before breakfast.     ferrous sulfate 325 (65 FE) MG tablet Take 162.5 mg by mouth See admin instructions. Every 3 days     furosemide (LASIX) 20 MG tablet Take 40 mg by mouth as needed for fluid or edema.     gabapentin (NEURONTIN) 400 MG capsule Take 800 mg by mouth at bedtime.      rosuvastatin (CRESTOR) 10 MG tablet Take 10 mg by mouth daily.     traZODone (DESYREL) 100 MG tablet Take 50 mg by mouth at bedtime.     Turmeric (QC TUMERIC COMPLEX) 500 MG CAPS Take 500 mg by mouth daily.     vitamin E 400 UNIT capsule Take 400 Units by mouth daily.     XIIDRA 5 % SOLN Apply 1 drop to eye 2 (two) times daily.     pantoprazole (PROTONIX) 40 MG tablet Take 1 tablet (40 mg total) by mouth daily. (Patient not taking: Reported on 06/15/2022) 30 tablet 0    Allergies as of 06/15/2022 - Review Complete 06/15/2022   Allergen Reaction Noted   Acetaminophen Other (See Comments) 08/31/2015   Penicillins Hives and Other (See Comments) 01/14/2017   Topamax [topiramate] Other (See Comments) 08/31/2015    Family History  Problem Relation Age of Onset   Hypertension Mother    Diabetes Mellitus II Mother    Hypertension Father    Diabetes Mellitus II Father    Colon polyps Father    Colon cancer Neg Hx    Esophageal cancer Neg Hx    Stomach cancer Neg Hx    Rectal cancer Neg Hx     Social History   Socioeconomic History   Marital status: Divorced    Spouse name: Not on file   Number of children: Not on file   Years of education: Not on file   Highest education level: Not on file  Occupational History   Not on file  Tobacco Use   Smoking status: Never   Smokeless tobacco: Never  Vaping Use   Vaping Use: Never used  Substance and Sexual Activity   Alcohol use: Not Currently   Drug use: No   Sexual activity: Yes    Birth control/protection: Surgical  Other Topics Concern   Not on file  Social History Narrative   Not on file   Social Determinants of Health   Financial Resource Strain: Not on file  Food Insecurity: Not on file  Transportation Needs: Not on file  Physical Activity: Not on file  Stress: Not on file  Social Connections: Not on file  Intimate Partner Violence: Not on file    Review of Systems: Pertinent positive and negative review of systems were noted in the above HPI section.  All other review of  systems was otherwise negative.   Physical Exam: Vital signs in last 24 hours: Temp:  [97.8 F (36.6 C)-98.3 F (36.8 C)] 98 F (36.7 C) (11/13 0720) Pulse Rate:  [60-84] 84 (11/13 0915) Resp:  [17-22] 18 (11/13 0915) BP: (100-141)/(47-81) 108/81 (11/13 0915) SpO2:  [94 %-100 %] 100 % (11/13 0915) Weight:  [86.2 kg] 86.2 kg (11/12 1242) Last BM Date : 06/15/22 General:   Alert,  Well-developed, well-nourished, older white female pleasant and cooperative in  NAD Head:  Normocephalic and atraumatic. Eyes:  Sclera clear, no icterus.   Conjunctiva pale Ears:  Normal auditory acuity. Nose:  No deformity, discharge,  or lesions. Mouth:  No deformity or lesions.   Neck:  Supple; no masses or thyromegaly. Lungs:  Clear throughout to auscultation.   No wheezes, crackles, or rhonchi.  Heart:  Regular rate and rhythm; no murmurs, clicks, rubs,  or gallops. Abdomen:  Soft,nontender, BS active,nonpalp mass or hsm.   Rectal: Not done today, documented heme-negative Msk:  Symmetrical without gross deformities. . Pulses:  Normal pulses noted. Extremities:  Without clubbing or edema. Neurologic:  Alert and  oriented x4;  grossly normal neurologically. Skin:  Intact without significant lesions or rashes.. Psych:  Alert and cooperative. Normal mood and affect.  Intake/Output from previous day: 11/12 0701 - 11/13 0700 In: 99.5 [IV Piggyback:99.5] Out: -  Intake/Output this shift: Total I/O In: 100 [I.V.:100] Out: -   Lab Results: Recent Labs    06/15/22 1251 06/16/22 0000 06/16/22 0500  WBC 4.2  --   --   HGB 8.3* 8.3* 7.7*  HCT 29.1* 28.4* 26.8*  PLT 263  --   --    BMET Recent Labs    06/15/22 1251 06/16/22 0500  NA 141 139  K 4.4 3.6  CL 109 108  CO2 26 23  GLUCOSE 102* 89  BUN 15 13  CREATININE 0.76 0.74  CALCIUM 9.1 8.7*   LFT No results for input(s): "PROT", "ALBUMIN", "AST", "ALT", "ALKPHOS", "BILITOT", "BILIDIR", "IBILI" in the last 72 hours. PT/INR No results for input(s): "LABPROT", "INR" in the last 72 hours. Hepatitis Panel No results for input(s): "HEPBSAG", "HCVAB", "HEPAIGM", "HEPBIGM" in the last 72 hours.   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  Patient presents now with shortness of breath  over the past few days with exertion, fatigue and relates an episode of coffee-ground emesis and dark stool which occurred about a month ago at which time she had an urgent care visit and had a normal hemoglobin but documented heme positive stool. Has been unaware of any melena since that time, however hemoglobin is down almost 4 g over the past month consistent with subacute GI blood loss and she is significantly iron deficient insistent with a component of chronic GI blood loss  She has a previously documented large hiatal hernia, rule out Cameron erosions, rule out subacute bleeding secondary to AVMs.  Colonoscopy - April 2021  #2 GERD-on low-dose PPI at home #3 hypertension #4.  Status post bilateral knee replacements   PLAN: N.p.o. this morning Place second IV IV PPI twice daily Have ordered IV Ferrlecit x2 Transfuse 1 unit of packed RBCs as patient was significantly symptomatic on arrival and hemoglobin now down to 7.7.  Need to keep her hemoglobin closer to 8 Patient  has been scheduled for EGD with Dr. Fuller Plan this afternoon.  Procedure was discussed in detail with the patient including indications risk and benefits and she is agreeable to proceed. If EGD is unremarkable consider repeat capsule endoscopy which may be able to be done as an outpatient. GI will follow with you   Amy Esterwood PA-C 06/16/2022, 10:02 AM    Attending Physician Note   I have taken a history, reviewed the chart and examined the patient. I performed a substantive portion of this encounter, including complete performance of at least one of the key components, in conjunction with the APP. I agree with the APP's note, impression and recommendations with my edits. My additional impressions and recommendations are as follows.   Iron deficiency anemia, symptomatic anemia, heme + stool. History of black emesis, black stool 1 month ago. Evaluation in 2021 for IDA with colon, EGD and VCE showed one SB AVM and a large  HH. R/O Cameron erosions, AVM bleeding. Transfuse to maintain Hgb > 7, IV Ferrlecit, IV PPI and EGD today. If EGD is unremarkable consider VCE as outpatient.   Lucio Edward, MD [facility] See AMION, [facility], for our on call provider

## 2022-06-16 NOTE — Anesthesia Preprocedure Evaluation (Addendum)
Anesthesia Evaluation  Patient identified by MRN, date of birth, ID band Patient awake    Reviewed: Allergy & Precautions, NPO status , Patient's Chart, lab work & pertinent test results  History of Anesthesia Complications Negative for: history of anesthetic complications  Airway Mallampati: II  TM Distance: >3 FB Neck ROM: Full    Dental  (+) Dental Advisory Given,    Pulmonary neg pulmonary ROS, neg shortness of breath, neg sleep apnea, neg COPD, neg recent URI   Pulmonary exam normal breath sounds clear to auscultation       Cardiovascular negative cardio ROS  Rhythm:Regular Rate:Normal     Neuro/Psych  Headaches    GI/Hepatic Neg liver ROS,GERD  ,,  Endo/Other  negative endocrine ROS    Renal/GU Renal disease (h/o nephrolithiasis)     Musculoskeletal  (+) Arthritis , Osteoarthritis,    Abdominal  (+) + obese  Peds  Hematology  (+) Blood dyscrasia (iron deficiency), anemia   Anesthesia Other Findings On pantoprazole gtt  Reproductive/Obstetrics                              Anesthesia Physical Anesthesia Plan  ASA: 3  Anesthesia Plan: MAC   Post-op Pain Management:    Induction: Intravenous  PONV Risk Score and Plan: 2 and Propofol infusion and TIVA  Airway Management Planned: Natural Airway and Nasal Cannula  Additional Equipment:   Intra-op Plan:   Post-operative Plan:   Informed Consent: I have reviewed the patients History and Physical, chart, labs and discussed the procedure including the risks, benefits and alternatives for the proposed anesthesia with the patient or authorized representative who has indicated his/her understanding and acceptance.       Plan Discussed with: CRNA and Anesthesiologist  Anesthesia Plan Comments: (Discussed with patient risks of MAC including, but not limited to, minor pain or discomfort, hearing people in the room, and possible  need for backup general anesthesia. Risks for general anesthesia also discussed including, but not limited to, sore throat, hoarse voice, chipped/damaged teeth, injury to vocal cords, nausea and vomiting, allergic reactions, lung infection, heart attack, stroke, and death. All questions answered. )        Anesthesia Quick Evaluation

## 2022-06-16 NOTE — Transfer of Care (Signed)
Immediate Anesthesia Transfer of Care Note  Patient: [name] [name]  Procedure(s) Performed: ESOPHAGOGASTRODUODENOSCOPY (EGD) WITH PROPOFOL  Patient Location: PACU  Anesthesia Type:MAC  Level of Consciousness: awake, oriented, and patient cooperative  Airway & Oxygen Therapy: Patient Spontanous Breathing and Patient connected to face mask oxygen  Post-op Assessment: Report given to RN and Post -op Vital signs reviewed and stable  Post vital signs: Reviewed  Last Vitals:  Vitals Value Taken Time  BP    Temp    Pulse 70 06/16/22 1450  Resp 16 06/16/22 1450  SpO2 98 % 06/16/22 1450  Vitals shown include unvalidated device data.  Last Pain:  Vitals:   06/16/22 1327  TempSrc: Temporal  PainSc: 0-No pain         Complications: No notable events documented.

## 2022-06-16 NOTE — Anesthesia Procedure Notes (Signed)
Procedure Name: MAC Date/Time: 06/16/2022 2:25 PM  Performed by: Jenne Campus, CRNAPre-anesthesia Checklist: Patient identified, Emergency Drugs available, Suction available and Patient being monitored Oxygen Delivery Method: Simple face mask Placement Confirmation: CO2 detector

## 2022-06-16 NOTE — Anesthesia Postprocedure Evaluation (Signed)
Anesthesia Post Note  Patient: Kayce S Skates  Procedure(s) Performed: ESOPHAGOGASTRODUODENOSCOPY (EGD) WITH PROPOFOL     Patient location during evaluation: PACU Anesthesia Type: MAC Level of consciousness: awake Pain management: pain level controlled Vital Signs Assessment: post-procedure vital signs reviewed and stable Respiratory status: spontaneous breathing, nonlabored ventilation and respiratory function stable Cardiovascular status: stable and blood pressure returned to baseline Postop Assessment: no apparent nausea or vomiting Anesthetic complications: no   No notable events documented.  Last Vitals:  Vitals:   06/16/22 1520 06/16/22 1530  BP: (!) 115/41 (!) 115/48  Pulse: 71 66  Resp: 19 (!) 22  Temp:    SpO2: 99% 97%    Last Pain:  Vitals:   06/16/22 1530  TempSrc:   PainSc: 0-No pain                 Nilda Simmer

## 2022-06-16 NOTE — Consult Note (Addendum)
Consultation  Referring Provider: [facility]/ Memon Primary Care Physician:  Adaline Sill, NP Primary Gastroenterologist:  Dr. Silverio Decamp   Reason for Consultation:   symptomatic anemia, iron deficiency  HPI: [name] [name] is a 61 y.o. female, known to Dr. Roland Rack who was last seen in our office in spring 2021.  She has history of iron deficiency anemia, requiring prior transfusions and iron infusions. She initially had been seen in 2019 for screening colonoscopy and at that time was found to have scattered mild the erythematous mucosa with erosions in the entire colon.  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  She was treated with Apriso for a period of time but self discontinued this.  She was then seen in March 2021 after episodes of dark stools.  She was found to be anemic again and underwent work-up with EGD in April 2021 which did reveal a large hiatal hernia and otherwise negative exam. Colonoscopy at that time was normal, random biopsies from the right and left colon were both negative. Capsule endoscopy showed 1 small AVM about 25 minutes beyond the first duodenal image, nonbleeding.  Says she had been doing okay over the past couple of years, no anemia to her knowledge, her PCP had reduced her oral iron to every third day she has avoided aspirin and NSAIDs and usually takes 20 mg of Nexium daily for GERD symptoms. She started feeling poorly this weekend, noticing that she was quite short of breath when trying to walk up a small incline.  The shortness of breath persisted and she eventually came to the ED yesterday afternoon.  She denied any chest pain, no abdominal pain, no nausea or vomiting but says that her appetite has been decreased over the past week or so. She has noticed some dark stools off and on but nothing consistent and no bright red blood. She did have an episode about a month ago with what she describes as black emesis and black stool.  She went to urgent  care, had labs done which were unremarkable and hemoglobin at that time 12.2/hematocrit 38.6.  She said that resolved and she did not have any further coffee-ground appearing emesis or dark stool. He says that she knew something was wrong because she felt just like she had a couple of years ago this weekend when she became short of breath.  Work-up in the ER with hemoglobin 8.8/hematocrit 29.1> 7.7 this morning hematocrit 26.8 MCV 93/platelets 263 BUN 15/creatinine 0.76 D-dimer within normal limits/troponin negative Heme negative but had been documented heme +68-monthago B12 and folate within normal limits Ferritin 12/serum iron 14/TIBC 343/iron sat 4  No imaging was done yesterday she did have CT of the abdomen and pelvis done in October with that urgent care visit and was found to have a large hiatal hernia tiny gallstones and some mild gallbladder dilation otherwise negative study..  She has been stable overnight, she has not received any blood, was started on PPI infusion.   Past Medical History:  Diagnosis Date   Allergy    Anemia 07/2017   on meds   Arthritis    Asthma    Black-out (not amnesia) 06/2017   black out spells due to anemia!   Blood transfusion without reported diagnosis    Pt thinks she had 3 units blood in Dec, 2018   GERD (gastroesophageal reflux disease)    History of kidney stones    Kidney stones    Migraine  Restless leg syndrome    Swelling of lower limb    bialteral lower leg swelling    Past Surgical History:  Procedure Laterality Date   COLONOSCOPY     MULTIPLE TOOTH EXTRACTIONS     right eye lid surgery      TOTAL KNEE ARTHROPLASTY Left 01/26/2017   Procedure: LEFT TOTAL KNEE ARTHROPLASTY;  Surgeon: Gaynelle Arabian, MD;  Location: [facility];  Service: Orthopedics;  Laterality: Left;  with block   TOTAL KNEE ARTHROPLASTY Right 10/26/2017   Procedure: RIGHT TOTAL KNEE ARTHROPLASTY;  Surgeon: Gaynelle Arabian, MD;  Location: [facility];  Service:  Orthopedics;  Laterality: Right;   TUBAL LIGATION      Prior to Admission medications   Medication Sig Start Date End Date Taking? Authorizing Provider  cetirizine (ZYRTEC) 10 MG tablet Take 10 mg by mouth at bedtime.   Yes [provider]  esomeprazole (NEXIUM) 10 MG packet Take 40 mg by mouth daily before breakfast.   Yes [provider]  ferrous sulfate 325 (65 FE) MG tablet Take 162.5 mg by mouth See admin instructions. Every 3 days   Yes [provider]  furosemide (LASIX) 20 MG tablet Take 40 mg by mouth as needed for fluid or edema.   Yes [provider]  gabapentin (NEURONTIN) 400 MG capsule Take 800 mg by mouth at bedtime.    Yes [provider]  rosuvastatin (CRESTOR) 10 MG tablet Take 10 mg by mouth daily. 05/23/22  Yes [provider]  traZODone (DESYREL) 100 MG tablet Take 50 mg by mouth at bedtime. 05/06/19  Yes [provider]  Turmeric (QC TUMERIC COMPLEX) 500 MG CAPS Take 500 mg by mouth daily.   Yes [provider]  vitamin E 400 UNIT capsule Take 400 Units by mouth daily.   Yes [provider]  XIIDRA 5 % SOLN Apply 1 drop to eye 2 (two) times daily. 05/27/22  Yes [provider]  pantoprazole (PROTONIX) 40 MG tablet Take 1 tablet (40 mg total) by mouth daily. Patient not taking: Reported on 06/15/2022 05/12/22 06/15/22  Tacy Learn, PA-C    Current Facility-Administered Medications  Medication Dose Route Frequency Provider Last Rate Last Admin   0.9 %  sodium chloride infusion (Manually program via Guardrails IV Fluids)   Intravenous Once Esterwood, Amy S, PA-C       0.9 %  sodium chloride infusion  500 mL Intravenous Once Nandigam, Venia Minks, MD       albuterol (VENTOLIN HFA) 108 (90 Base) MCG/ACT inhaler 2 puff  2 puff Inhalation Q4H PRN Malvin Johns, MD       ferric gluconate (FERRLECIT) 250 mg in sodium chloride 0.9 % 250 mL IVPB  250 mg Intravenous Daily Esterwood, Amy S,  PA-C       gabapentin (NEURONTIN) capsule 800 mg  800 mg Oral QHS Jennette Kettle M, DO   800 mg at 06/15/22 2131   Lifitegrast 5 % SOLN 1 drop  1 drop Ophthalmic BID Etta Quill, DO   1 drop at 06/15/22 2241   ondansetron (ZOFRAN) tablet 4 mg  4 mg Oral Q6H PRN Etta Quill, DO       Or   ondansetron [facility]) injection 4 mg  4 mg Intravenous Q6H PRN Etta Quill, DO       [START ON 06/19/2022] pantoprazole (PROTONIX) injection 40 mg  40 mg Intravenous Q12H Jennette Kettle M, DO       pantoprozole ([place])  80 mg /NS 100 mL infusion  8 mg/hr Intravenous Continuous Etta Quill, DO 10 mL/hr at 06/16/22 0809 8 mg/hr at 06/16/22 0809   rosuvastatin (CRESTOR) tablet 10 mg  10 mg Oral Daily Jennette Kettle M, DO       traZODone (DESYREL) tablet 50 mg  50 mg Oral QHS Jennette Kettle M, DO   50 mg at 06/15/22 2131   Vitamin E CAPS 400 Units  400 Units Oral Daily Etta Quill, DO       Current Outpatient Medications  Medication Sig Dispense Refill   cetirizine (ZYRTEC) 10 MG tablet Take 10 mg by mouth at bedtime.     esomeprazole (NEXIUM) 10 MG packet Take 40 mg by mouth daily before breakfast.     ferrous sulfate 325 (65 FE) MG tablet Take 162.5 mg by mouth See admin instructions. Every 3 days     furosemide (LASIX) 20 MG tablet Take 40 mg by mouth as needed for fluid or edema.     gabapentin (NEURONTIN) 400 MG capsule Take 800 mg by mouth at bedtime.      rosuvastatin (CRESTOR) 10 MG tablet Take 10 mg by mouth daily.     traZODone (DESYREL) 100 MG tablet Take 50 mg by mouth at bedtime.     Turmeric (QC TUMERIC COMPLEX) 500 MG CAPS Take 500 mg by mouth daily.     vitamin E 400 UNIT capsule Take 400 Units by mouth daily.     XIIDRA 5 % SOLN Apply 1 drop to eye 2 (two) times daily.     pantoprazole (PROTONIX) 40 MG tablet Take 1 tablet (40 mg total) by mouth daily. (Patient not taking: Reported on 06/15/2022) 30 tablet 0    Allergies as of 06/15/2022 - Review Complete 06/15/2022   Allergen Reaction Noted   Acetaminophen Other (See Comments) 08/31/2015   Penicillins Hives and Other (See Comments) 01/14/2017   Topamax [topiramate] Other (See Comments) 08/31/2015    Family History  Problem Relation Age of Onset   Hypertension Mother    Diabetes Mellitus II Mother    Hypertension Father    Diabetes Mellitus II Father    Colon polyps Father    Colon cancer Neg Hx    Esophageal cancer Neg Hx    Stomach cancer Neg Hx    Rectal cancer Neg Hx     Social History   Socioeconomic History   Marital status: Divorced    Spouse name: Not on file   Number of children: Not on file   Years of education: Not on file   Highest education level: Not on file  Occupational History   Not on file  Tobacco Use   Smoking status: Never   Smokeless tobacco: Never  Vaping Use   Vaping Use: Never used  Substance and Sexual Activity   Alcohol use: Not Currently   Drug use: No   Sexual activity: Yes    Birth control/protection: Surgical  Other Topics Concern   Not on file  Social History Narrative   Not on file   Social Determinants of Health   Financial Resource Strain: Not on file  Food Insecurity: Not on file  Transportation Needs: Not on file  Physical Activity: Not on file  Stress: Not on file  Social Connections: Not on file  Intimate Partner Violence: Not on file    Review of Systems: Pertinent positive and negative review of systems were noted in the above HPI section.  All other review of  systems was otherwise negative.   Physical Exam: Vital signs in last 24 hours: Temp:  [97.8 F (36.6 C)-98.3 F (36.8 C)] 98 F (36.7 C) (11/13 0720) Pulse Rate:  [60-84] 84 (11/13 0915) Resp:  [17-22] 18 (11/13 0915) BP: (100-141)/(47-81) 108/81 (11/13 0915) SpO2:  [94 %-100 %] 100 % (11/13 0915) Weight:  [86.2 kg] 86.2 kg (11/12 1242) Last BM Date : 06/15/22 General:   Alert,  Well-developed, well-nourished, older white female pleasant and cooperative in  NAD Head:  Normocephalic and atraumatic. Eyes:  Sclera clear, no icterus.   Conjunctiva pale Ears:  Normal auditory acuity. Nose:  No deformity, discharge,  or lesions. Mouth:  No deformity or lesions.   Neck:  Supple; no masses or thyromegaly. Lungs:  Clear throughout to auscultation.   No wheezes, crackles, or rhonchi.  Heart:  Regular rate and rhythm; no murmurs, clicks, rubs,  or gallops. Abdomen:  Soft,nontender, BS active,nonpalp mass or hsm.   Rectal: Not done today, documented heme-negative Msk:  Symmetrical without gross deformities. . Pulses:  Normal pulses noted. Extremities:  Without clubbing or edema. Neurologic:  Alert and  oriented x4;  grossly normal neurologically. Skin:  Intact without significant lesions or rashes.. Psych:  Alert and cooperative. Normal mood and affect.  Intake/Output from previous day: 11/12 0701 - 11/13 0700 In: 99.5 [IV Piggyback:99.5] Out: -  Intake/Output this shift: Total I/O In: 100 [I.V.:100] Out: -   Lab Results: Recent Labs    06/15/22 1251 06/16/22 0000 06/16/22 0500  WBC 4.2  --   --   HGB 8.3* 8.3* 7.7*  HCT 29.1* 28.4* 26.8*  PLT 263  --   --    BMET Recent Labs    06/15/22 1251 06/16/22 0500  NA 141 139  K 4.4 3.6  CL 109 108  CO2 26 23  GLUCOSE 102* 89  BUN 15 13  CREATININE 0.76 0.74  CALCIUM 9.1 8.7*   LFT No results for input(s): "PROT", "ALBUMIN", "AST", "ALT", "ALKPHOS", "BILITOT", "BILIDIR", "IBILI" in the last 72 hours. PT/INR No results for input(s): "LABPROT", "INR" in the last 72 hours. Hepatitis Panel No results for input(s): "HEPBSAG", "HCVAB", "HEPAIGM", "HEPBIGM" in the last 72 hours.   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  Patient presents now with shortness of breath  over the past few days with exertion, fatigue and relates an episode of coffee-ground emesis and dark stool which occurred about a month ago at which time she had an urgent care visit and had a normal hemoglobin but documented heme positive stool. Has been unaware of any melena since that time, however hemoglobin is down almost 4 g over the past month consistent with subacute GI blood loss and she is significantly iron deficient insistent with a component of chronic GI blood loss  She has a previously documented large hiatal hernia, rule out Cameron erosions, rule out subacute bleeding secondary to AVMs.  Colonoscopy - April 2021  #2 GERD-on low-dose PPI at home #3 hypertension #4.  Status post bilateral knee replacements   PLAN: N.p.o. this morning Place second IV IV PPI twice daily Have ordered IV Ferrlecit x2 Transfuse 1 unit of packed RBCs as patient was significantly symptomatic on arrival and hemoglobin now down to 7.7.  Need to keep her hemoglobin closer to 8 Patient  has been scheduled for EGD with Dr. Fuller Plan this afternoon.  Procedure was discussed in detail with the patient including indications risk and benefits and she is agreeable to proceed. If EGD is unremarkable consider repeat capsule endoscopy which may be able to be done as an outpatient. GI will follow with you   Amy Esterwood PA-C 06/16/2022, 10:02 AM    Attending Physician Note   I have taken a history, reviewed the chart and examined the patient. I performed a substantive portion of this encounter, including complete performance of at least one of the key components, in conjunction with the APP. I agree with the APP's note, impression and recommendations with my edits. My additional impressions and recommendations are as follows.   Iron deficiency anemia, symptomatic anemia, heme + stool. History of black emesis, black stool 1 month ago. Evaluation in 2021 for IDA with colon, EGD and VCE showed one SB AVM and a large  HH. R/O Cameron erosions, AVM bleeding. Transfuse to maintain Hgb > 7, IV Ferrlecit, IV PPI and EGD today. If EGD is unremarkable consider VCE as outpatient.   Lucio Edward, MD [facility] See [facility], [facility], for our on call provider

## 2022-06-16 NOTE — Op Note (Signed)
[facility] Patient Name: [name] [name] Procedure Date: 06/16/2022 MRN: [id number] Attending MD: Ladene Artist , MD, [id number] Date of Birth: [birth year] CSN: [id number] Age: 61 Admit Type: Inpatient Procedure:                Upper GI endoscopy Indications:              Unexplained iron deficiency anemia, Heme positive                            stool, CG emesis, dark stool Providers:                Pricilla Riffle. Fuller Plan, MD, Burtis Junes, RN, [facility]                            Technician, Technician Referring MD:             [facility] Medicines:                Monitored Anesthesia Care Complications:            No immediate complications. Estimated Blood Loss:     Estimated blood loss was minimal. Procedure:                Pre-Anesthesia Assessment:                           - Prior to the procedure, a History and Physical                            was performed, and patient medications and                            allergies were reviewed. The patient's tolerance of                            previous anesthesia was also reviewed. The risks                            and benefits of the procedure and the sedation                            options and risks were discussed with the patient.                            All questions were answered, and informed consent                            was obtained. Prior Anticoagulants: The patient has                            taken no anticoagulant or antiplatelet agents. ASA                            Grade Assessment: II - A patient with mild systemic  disease. After reviewing the risks and benefits,                            the patient was deemed in satisfactory condition to                            undergo the procedure.                           After obtaining informed consent, the endoscope was                            passed under direct vision. Throughout the                             procedure, the patient's blood pressure, pulse, and                            oxygen saturations were monitored continuously. The                            [id number]-H190 ([id number]) Olympus endoscope was introduced                            through the mouth, and advanced to the second part                            of duodenum. The upper GI endoscopy was                            accomplished without difficulty. The patient                            tolerated the procedure well. Scope In: Scope Out: Findings:      The examined esophagus was normal. Z-line at 32 cm.      A large hiatal hernia was present measuring 8 cm.      A few localized medium erosions and patchy erythema with no bleeding and       no stigmata of recent bleeding were found in the gastric fundus       associated with the hiatal hernia. Biopsies were taken with a cold       forceps for histology.      The exam of the stomach was otherwise normal.      The duodenal bulb and second portion of the duodenum were normal.       Biopsies for histology were taken with a cold forceps for evaluation of       celiac disease. Impression:               - Normal esophagus.                           - Large hiatal hernia.                           - Camerons erosions: erosive gastropathy, gastritis  with no bleeding and no stigmata of recent bleeding                            associated with the hiatal hernia. Biopsied.                           - Normal duodenal bulb and second portion of the                            duodenum. Biopsied. Moderate Sedation:      Not Applicable - Patient had care per Anesthesia. Recommendation:           - Return patient to hospital ward for ongoing care.                           - Soft diet today.                           - Continue present medications.                           - Pantoprazole 40 mg po bid for 2 weeks then 40 mg                            po qd long  term.                           - Await pathology results.                           - No addition inpatient GI evaluation is planned.                           - Close follow up with PCP to manage IDA.                            Anticipate recurrent IDA due to [facility] erosions.                           - Return to GI office in 2 months with Dr. Silverio Decamp. Procedure Code(s):        --- Professional ---                           [id number], Esophagogastroduodenoscopy, flexible,                            transoral; with biopsy, single or multiple Diagnosis Code(s):        --- Professional ---                           K44.9, Diaphragmatic hernia without obstruction or                            gangrene  K31.89, Other diseases of stomach and duodenum                           D50.9, Iron deficiency anemia, unspecified                           R19.5, Other fecal abnormalities CPT copyright 2022 [employer]. All rights reserved. The codes documented in this report are preliminary and upon coder review may  be revised to meet current compliance requirements. Ladene Artist, MD 06/16/2022 2:51:18 PM This report has been signed electronically. Number of Addenda: 0

## 2022-06-17 ENCOUNTER — Other Ambulatory Visit: Payer: Self-pay

## 2022-06-17 ENCOUNTER — Encounter: Payer: Self-pay | Admitting: Gastroenterology

## 2022-06-17 ENCOUNTER — Telehealth: Payer: Self-pay | Admitting: Gastroenterology

## 2022-06-17 DIAGNOSIS — K2940 Chronic atrophic gastritis without bleeding: Secondary | ICD-10-CM | POA: Diagnosis not present

## 2022-06-17 DIAGNOSIS — K921 Melena: Secondary | ICD-10-CM | POA: Diagnosis not present

## 2022-06-17 DIAGNOSIS — K922 Gastrointestinal hemorrhage, unspecified: Secondary | ICD-10-CM | POA: Diagnosis not present

## 2022-06-17 DIAGNOSIS — D509 Iron deficiency anemia, unspecified: Secondary | ICD-10-CM | POA: Diagnosis not present

## 2022-06-17 LAB — CBC
HCT: 29 % — ABNORMAL LOW (ref 36.0–46.0)
Hemoglobin: 8.7 g/dL — ABNORMAL LOW (ref 12.0–15.0)
MCH: 27.1 pg (ref 26.0–34.0)
MCHC: 30 g/dL (ref 30.0–36.0)
MCV: 90.3 fL (ref 80.0–100.0)
Platelets: 235 10*3/uL (ref 150–400)
RBC: 3.21 MIL/uL — ABNORMAL LOW (ref 3.87–5.11)
RDW: 17.1 % — ABNORMAL HIGH (ref 11.5–15.5)
WBC: 5.6 10*3/uL (ref 4.0–10.5)
nRBC: 0 % (ref 0.0–0.2)

## 2022-06-17 LAB — SURGICAL PATHOLOGY

## 2022-06-17 MED ORDER — PANTOPRAZOLE SODIUM 40 MG PO TBEC
40.0000 mg | DELAYED_RELEASE_TABLET | Freq: Two times a day (BID) | ORAL | Status: DC
  Administered 2022-06-17: 40 mg via ORAL
  Filled 2022-06-17: qty 1

## 2022-06-17 MED ORDER — PANTOPRAZOLE SODIUM 40 MG PO TBEC: 40.0000 mg | DELAYED_RELEASE_TABLET | Freq: Two times a day (BID) | ORAL | 2 refills | Status: DC

## 2022-06-17 NOTE — Progress Notes (Signed)
  Transition of Care (TOC) Screening Note   Patient Details  Name: [name] [name] Date of Birth: 1961-02-07   Transition of Care [facility] CM/SW Contact:    Vassie Moselle, LCSW Phone Number: 06/17/2022, 10:43 AM    Transition of Care Department [facility]) has reviewed patient and no TOC needs have been identified at this time. We will continue to monitor patient advancement through interdisciplinary progression rounds. If new patient transition needs arise, please place a TOC consult.

## 2022-06-17 NOTE — Plan of Care (Signed)
Patient is discharging home today post iron infusion, she is stable and in no distress. IV lines will be removed and AVS will be reviewed with the patient.

## 2022-06-17 NOTE — Discharge Summary (Signed)
Physician Discharge Summary   Patient: [name] [name] MRN: [id number] DOB: 1960/11/26  Admit date:     06/15/2022  Discharge date: 06/17/22  Discharge Physician: Lorella Nimrod   PCP: Adaline Sill, NP   Recommendations at discharge:  Please obtain CBC and BMP in 1 week Follow-up with primary care provider within a week Follow-up with gastroenterology.  Discharge Diagnoses: Principal Problem:   Gastrointestinal hemorrhage with melena Active Problems:   Symptomatic anemia   ABLA (acute blood loss anemia)   URI (upper respiratory infection)   Occult blood in stools   Hospital Course: 61 year old female admitted to the hospital with dyspnea on exertion.  She did admit to noticing intermittent melena for the past month.  Hemoglobin at baseline 12.2, down to 7.9 during her hospitalization.  Seen by GI and underwent EGD that showed hiatal hernia with Lysbeth Galas erosions.  Patient received 1 unit of PRBC and iron infusion.  Patient did not had any more bleeding with stable and improving hemoglobin.  GI is advising continuation of Protonix twice daily for 3 to 4 weeks followed by once daily for longer duration.  Patient will continue on current medications and need to have a close follow-up with her providers for further recommendations.  Assessment and Plan: * Gastrointestinal hemorrhage with melena Intermittent melena over past month, and HGB drop from 12.2 to 8.3 in the 1 month time frame. PPI GTT Tele monitor Clear liquid diet H/H Q6H Message sent to GI for AM consult NPO after MN per Dr. Rush Landmark  Symptomatic anemia ABLA due to GIB Type and screen H/H Q6H Tele monitor Transfuse if < 7 On rather low dose of iron at home (half a tab every 3 days) Normocytic and normochromic anemia, likely just ABLA But ill check an anemia panel to see where her iron numbers are at  URI (upper respiratory infection) New onset cough and URI symptoms today in ED. COVID and Flu tests  pending CXR neg   Consultants: Gastroenterology Procedures performed: EGD Disposition: Home Diet recommendation:  Discharge Diet Orders (From admission, onward)     Start     Ordered   06/17/22 0000  Diet - low sodium heart healthy        06/17/22 1036           Regular diet DISCHARGE MEDICATION: Allergies as of 06/17/2022       Reactions   Acetaminophen Other (See Comments)   Very sleepy   Penicillins Hives, Other (See Comments)   FEB 2018 - broke out in hives Has patient had a PCN reaction causing immediate rash, facial/tongue/throat swelling, SOB or lightheadedness with hypotension: Yes Has patient had a PCN reaction causing severe rash involving mucus membranes or skin necrosis: No Has patient had a PCN reaction that required hospitalization: No Has patient had a PCN reaction occurring within the last 10 years: Yes If all of the above answers are "NO", then may proceed with Cephalosporin use.   Topamax [topiramate] Other (See Comments)   Kidney stones        Medication List     STOP taking these medications    esomeprazole 10 MG packet Commonly known as: Crossgate these medications    cetirizine 10 MG tablet Commonly known as: ZYRTEC Take 10 mg by mouth at bedtime.   ferrous sulfate 325 (65 FE) MG tablet Take 162.5 mg by mouth See admin instructions. Every 3 days   furosemide 20 MG tablet Commonly known  as: LASIX Take 40 mg by mouth as needed for fluid or edema.   gabapentin 400 MG capsule Commonly known as: NEURONTIN Take 800 mg by mouth at bedtime.   pantoprazole 40 MG tablet Commonly known as: PROTONIX Take 1 tablet (40 mg total) by mouth 2 (two) times daily. For 4 weeks then 1 tablet daily What changed:  when to take this additional instructions   QC Tumeric Complex 500 MG Caps Generic drug: Turmeric Take 500 mg by mouth daily.   rosuvastatin 10 MG tablet Commonly known as: CRESTOR Take 10 mg by mouth daily.   traZODone  100 MG tablet Commonly known as: DESYREL Take 50 mg by mouth at bedtime.   vitamin E 180 MG (400 UNITS) capsule Take 400 Units by mouth daily.   Xiidra 5 % Soln Generic drug: Lifitegrast Apply 1 drop to eye 2 (two) times daily.        Follow-up Information     Adaline Sill, NP. Schedule an appointment as soon as possible for a visit in 1 week(s).   Specialty: Internal Medicine Contact information: [address] [place] [address] [place] [address] [phone]         Ladene Artist, MD. Schedule an appointment as soon as possible for a visit in 1 week(s).   Specialty: Gastroenterology Contact information: [address] [place] [address] [phone]                Discharge Exam: Filed Weights   06/15/22 1242 06/16/22 1614  Weight: 86.2 kg 82.4 kg   General.     In no acute distress. Pulmonary.  Lungs clear bilaterally, normal respiratory effort. CV.  Regular rate and rhythm, no JVD, rub or murmur. Abdomen.  Soft, nontender, nondistended, BS positive. CNS.  Alert and oriented .  No focal neurologic deficit. Extremities.  No edema, no cyanosis, pulses intact and symmetrical. Psychiatry.  Judgment and insight appears normal.   Condition at discharge: stable  The results of significant diagnostics from this hospitalization (including imaging, microbiology, ancillary and laboratory) are listed below for reference.   Imaging Studies: DG Chest 2 View  Result Date: 06/15/2022 CLINICAL DATA:  Patient reports this morning around 1045 she experienced an episode of SOB while getting ready for church. Endorses pain in rib/abdomen/chest and shoulders. Patient states she feels fine in triage, no pain. Says she has also felt tired lately. EXAM: CHEST - 2 VIEW COMPARISON:  06/14/2019.  CT could and chest radiograph. FINDINGS: Cardiac silhouette normal in size. Moderate size hiatal hernia. No mediastinal or hilar masses or evidence of adenopathy. Clear lungs.  No  pleural effusion or pneumothorax. Skeletal structures are intact. IMPRESSION: No active cardiopulmonary disease. Electronically Signed   By: Lajean Manes M.D.   On: 06/15/2022 13:36    Microbiology: Results for orders placed or performed during the hospital encounter of 06/15/22  Resp Panel by RT-PCR (Flu A&B, Covid) Anterior Nasal Swab     Status: None   Collection Time: 06/15/22  7:48 PM   Specimen: Anterior Nasal Swab  Result Value Ref Range Status   SARS Coronavirus 2 by RT PCR NEGATIVE NEGATIVE Final    Comment: (NOTE) SARS-CoV-2 target nucleic acids are NOT DETECTED.  The SARS-CoV-2 RNA is generally detectable in upper respiratory specimens during the acute phase of infection. The lowest concentration of SARS-CoV-2 viral copies this assay can detect is 138 copies/mL. A negative result does not preclude SARS-Cov-2 infection and should not be used as  the sole basis for treatment or other patient management decisions. A negative result may occur with  improper specimen collection/handling, submission of specimen other than nasopharyngeal swab, presence of viral mutation(s) within the areas targeted by this assay, and inadequate number of viral copies(<138 copies/mL). A negative result must be combined with clinical observations, patient history, and epidemiological information. The expected result is Negative.  Fact Sheet for Patients:  EntrepreneurPulse.com.au  Fact Sheet for Healthcare Providers:  IncredibleEmployment.be  This test is no t yet approved or cleared by the [place] FDA and  has been authorized for detection and/or diagnosis of SARS-CoV-2 by FDA under an Emergency Use Authorization (EUA). This EUA will remain  in effect (meaning this test can be used) for the duration of the COVID-19 declaration under Section 564(b)(1) of the Act, 21 U.S.C.section 360bbb-3(b)(1), unless the authorization is terminated  or revoked sooner.        Influenza A by PCR NEGATIVE NEGATIVE Final   Influenza B by PCR NEGATIVE NEGATIVE Final    Comment: (NOTE) The Xpert Xpress SARS-CoV-2/FLU/RSV plus assay is intended as an aid in the diagnosis of influenza from Nasopharyngeal swab specimens and should not be used as a sole basis for treatment. Nasal washings and aspirates are unacceptable for Xpert Xpress SARS-CoV-2/FLU/RSV testing.  Fact Sheet for Patients: EntrepreneurPulse.com.au  Fact Sheet for Healthcare Providers: IncredibleEmployment.be  This test is not yet approved or cleared by the [place] FDA and has been authorized for detection and/or diagnosis of SARS-CoV-2 by FDA under an Emergency Use Authorization (EUA). This EUA will remain in effect (meaning this test can be used) for the duration of the COVID-19 declaration under Section 564(b)(1) of the Act, 21 U.S.C. section 360bbb-3(b)(1), unless the authorization is terminated or revoked.  Performed at [facility], [place] [address], [place], [place] [address]     Labs: CBC: Recent Labs  Lab 06/15/22 1251 06/16/22 0000 06/16/22 0500 06/16/22 1308 06/17/22 0512  WBC 4.2  --   --   --  5.6  HGB 8.3* 8.3* 7.7* 7.9* 8.7*  HCT 29.1* 28.4* 26.8* 27.5* 29.0*  MCV 93.3  --   --   --  90.3  PLT 263  --   --   --  767   Basic Metabolic Panel: Recent Labs  Lab 06/15/22 1251 06/16/22 0500  NA 141 139  K 4.4 3.6  CL 109 108  CO2 26 23  GLUCOSE 102* 89  BUN 15 13  CREATININE 0.76 0.74  CALCIUM 9.1 8.7*   Liver Function Tests: No results for input(s): "AST", "ALT", "ALKPHOS", "BILITOT", "PROT", "ALBUMIN" in the last 168 hours. CBG: No results for input(s): "GLUCAP" in the last 168 hours.  Discharge time spent: greater than 30 minutes.  This record has been created using Systems analyst. Errors have been sought and corrected,but may not always be located. Such creation errors  do not reflect on the standard of care.   Signed: Lorella Nimrod, MD [facility] 06/17/2022

## 2022-06-17 NOTE — Progress Notes (Addendum)
Patient ID: [name] [name], female   DOB: 1961/07/24, 61 y.o.   MRN: [id number]    Progress Note   Subjective   Day # 2  CC; symptomatic anemia with weakness, shortness of breath, iron deficiency  Ferrlecit IV ordered x2  EGD yesterday-large 8 cm hiatal hernia with a few localized medium erosions and patchy erythema, no bleeding and no stigmata of recent bleeding found in the gastric fundus associated with the large hiatal hernia remainder of exam negative, biopsies pending  WBC 5.6/hemoglobin 8.7/hematocrit 29.0 post 1 unit yesterday Ferritin 12/serum iron/routine/TIBC 393/sat 4  Patient says she feels better no current shortness of breath, able to eat a solid diet without difficulty, she did have some pain in her left scapula today and its tender to touch-prove meant with Neurontin   Objective   Vital signs in last 24 hours: Temp:  [98.1 F (36.7 C)-98.6 F (37 C)] 98.1 F (36.7 C) (11/14 0529) Pulse Rate:  [65-89] 65 (11/14 0529) Resp:  [14-22] 18 (11/14 0529) BP: (54-146)/(36-81) 102/48 (11/14 0529) SpO2:  [96 %-100 %] 99 % (11/14 0529) Weight:  [82.4 kg] 82.4 kg (11/13 1614) Last BM Date : 06/15/22 General:   Older white female in NAD Heart:  Regular rate and rhythm; no murmurs Lungs: Respirations even and unlabored, lungs CTA bilaterally-mildly tender with palpation in the left medial scapula Abdomen:  Soft, nontender and nondistended. Normal bowel sounds. Extremities:  Without edema. Neurologic:  Alert and oriented,  grossly normal neurologically. Psych:  Cooperative. Normal mood and affect.  Intake/Output from previous day: 11/13 0701 - 11/14 0700 In: 1334.4 [I.V.:600; Blood:384; IV Piggyback:350.4] Out: -  Intake/Output this shift: No intake/output data recorded.  Lab Results: Recent Labs    06/15/22 1251 06/16/22 0000 06/16/22 0500 06/16/22 1308 06/17/22 0512  WBC 4.2  --   --   --  5.6  HGB 8.3*   < > 7.7* 7.9* 8.7*  HCT 29.1*   < > 26.8* 27.5*  29.0*  PLT 263  --   --   --  235   < > = values in this interval not displayed.   BMET Recent Labs    06/15/22 1251 06/16/22 0500  NA 141 139  K 4.4 3.6  CL 109 108  CO2 26 23  GLUCOSE 102* 89  BUN 15 13  CREATININE 0.76 0.74  CALCIUM 9.1 8.7*    Studies/Results: DG Chest 2 View  Result Date: 06/15/2022 CLINICAL DATA:  Patient reports this morning around 1045 she experienced an episode of SOB while getting ready for church. Endorses pain in rib/abdomen/chest and shoulders. Patient states she feels fine in triage, no pain. Says she has also felt tired lately. EXAM: CHEST - 2 VIEW COMPARISON:  06/14/2019.  CT could and chest radiograph. FINDINGS: Cardiac silhouette normal in size. Moderate size hiatal hernia. No mediastinal or hilar masses or evidence of adenopathy. Clear lungs.  No pleural effusion or pneumothorax. Skeletal structures are intact. IMPRESSION: No active cardiopulmonary disease. Electronically Signed   By: Lajean Manes M.D.   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  #2 Hypertension #3 Status post bilateral knee replacements  Plan: Patient is currently receiving second dose of IV Ferrlecit Protonix 40 mg bid for at least 12 weeks, then can consider once daily dosing chronically Antireflux regimen including n.p.o. for 3 hours prior to  bedtime and elevation of the back 45 degrees for sleep Resume ferrous sulfate 325 mg once daily (had recently been taking every third day) She is stable from GI standpoint for discharge to home today Needs to have repeat labs done in about 2 weeks which can be done through her PCP, then we will arrange outpatient follow-up with Dr. Silverio Decamp  or myself in the office in about 1 month   LOS: 0 days   Amy Esterwood PA-C 06/17/2022, 8:46 AM    Attending Physician Note   I have taken an interval history, reviewed the chart and examined the patient. I performed a substantive portion of this encounter, including complete performance of at least one of the key components, in conjunction with the APP. I agree with the APP's note, impression and recommendations.   Pantoprazole 40 mg po bid for now FeSO4 325 mg po qd with food OK for discharge today from GI standpoint Follow up with PCP for IDA mgmt GI follow up with Dr. Silverio Decamp or Nicoletta Ba, PAC in 1 month  Lucio Edward, MD [facility] See Shea Evans, [facility], for our on call provider

## 2022-06-17 NOTE — Telephone Encounter (Signed)
Patient called states she was seen at the hospital and DR. Fuller Plan ask that she schedule a follow up with Dr. Silverio Decamp however she does not have anything available can you assist.

## 2022-06-17 NOTE — Progress Notes (Signed)
AVS reviewed and patient acknowledged understanding. Patient transported to the main entrance in stable condition

## 2022-06-17 NOTE — Telephone Encounter (Signed)
Spoke with the patient. Scheduled with Tye Savoy, NP 07/18/22 at 10:00 am.

## 2022-06-19 LAB — TYPE AND SCREEN
ABO/RH(D): O POS
Antibody Screen: NEGATIVE
Unit division: 0
Unit division: 0

## 2022-06-19 LAB — BPAM RBC
Blood Product Expiration Date: 202312102359
Blood Product Expiration Date: 202312102359
ISSUE DATE / TIME: 202311132339
Unit Type and Rh: 5100
Unit Type and Rh: 5100

## 2022-06-20 ENCOUNTER — Encounter (HOSPITAL_COMMUNITY): Payer: Self-pay | Admitting: Gastroenterology

## 2022-06-25 ENCOUNTER — Ambulatory Visit
Admission: RE | Admit: 2022-06-25 | Discharge: 2022-06-25 | Disposition: A | Discharge status: Home / Self Care | Payer: Medicare Other | Source: Ambulatory Visit | Attending: Internal Medicine | Admitting: Internal Medicine

## 2022-06-25 DIAGNOSIS — Z1231 Encounter for screening mammogram for malignant neoplasm of breast: Secondary | ICD-10-CM

## 2022-07-01 ENCOUNTER — Other Ambulatory Visit: Payer: Self-pay | Admitting: Internal Medicine

## 2022-07-01 DIAGNOSIS — R928 Other abnormal and inconclusive findings on diagnostic imaging of breast: Secondary | ICD-10-CM

## 2022-07-05 ENCOUNTER — Ambulatory Visit
Admission: RE | Admit: 2022-07-05 | Discharge: 2022-07-05 | Disposition: A | Discharge status: Home / Self Care | Payer: Medicare Other | Source: Ambulatory Visit | Attending: Internal Medicine | Admitting: Internal Medicine

## 2022-07-05 ENCOUNTER — Ambulatory Visit: Payer: Medicare Other

## 2022-07-05 DIAGNOSIS — R928 Other abnormal and inconclusive findings on diagnostic imaging of breast: Secondary | ICD-10-CM

## 2022-07-18 ENCOUNTER — Encounter: Payer: Self-pay | Admitting: Nurse Practitioner

## 2022-07-18 ENCOUNTER — Other Ambulatory Visit (INDEPENDENT_AMBULATORY_CARE_PROVIDER_SITE_OTHER): Payer: Medicare Other

## 2022-07-18 ENCOUNTER — Ambulatory Visit (INDEPENDENT_AMBULATORY_CARE_PROVIDER_SITE_OTHER): Payer: Medicare Other | Admitting: Nurse Practitioner

## 2022-07-18 VITALS — BP 118/68 | HR 66 | Ht 64.0 in | Wt 186.5 lb

## 2022-07-18 DIAGNOSIS — K253 Acute gastric ulcer without hemorrhage or perforation: Secondary | ICD-10-CM

## 2022-07-18 DIAGNOSIS — D509 Iron deficiency anemia, unspecified: Secondary | ICD-10-CM

## 2022-07-18 DIAGNOSIS — K449 Diaphragmatic hernia without obstruction or gangrene: Secondary | ICD-10-CM

## 2022-07-18 LAB — CBC WITH DIFFERENTIAL/PLATELET
Basophils Absolute: 0.1 10*3/uL (ref 0.0–0.1)
Basophils Relative: 1 % (ref 0.0–3.0)
Eosinophils Absolute: 0.5 10*3/uL (ref 0.0–0.7)
Eosinophils Relative: 7.6 % — ABNORMAL HIGH (ref 0.0–5.0)
HCT: 36.5 % (ref 36.0–46.0)
Hemoglobin: 11.8 g/dL — ABNORMAL LOW (ref 12.0–15.0)
Lymphocytes Relative: 16.2 % (ref 12.0–46.0)
Lymphs Abs: 1.1 10*3/uL (ref 0.7–4.0)
MCHC: 32.4 g/dL (ref 30.0–36.0)
MCV: 90.3 fl (ref 78.0–100.0)
Monocytes Absolute: 0.4 10*3/uL (ref 0.1–1.0)
Monocytes Relative: 5.9 % (ref 3.0–12.0)
Neutro Abs: 4.7 10*3/uL (ref 1.4–7.7)
Neutrophils Relative %: 69.3 % (ref 43.0–77.0)
Platelets: 239 10*3/uL (ref 150.0–400.0)
RBC: 4.04 Mil/uL (ref 3.87–5.11)
RDW: 19.7 % — ABNORMAL HIGH (ref 11.5–15.5)
WBC: 6.8 10*3/uL (ref 4.0–10.5)

## 2022-07-18 NOTE — Patient Instructions (Signed)
_______________________________________________________  If you are age 62 or older, your body mass index should be between 23-30. Your Body mass index is 32.01 kg/m. If this is out of the aforementioned range listed, please consider follow up with your Primary Care Provider.  If you are age 69 or younger, your body mass index should be between 19-25. Your Body mass index is 32.01 kg/m. If this is out of the aformentioned range listed, please consider follow up with your Primary Care Provider.   ________________________________________________________  The [facility] providers would like to encourage you to use [facility] to communicate with providers for non-urgent requests or questions.  Due to long hold times on the telephone, sending your provider a message by [facility] may be a faster and more efficient way to get a response.  Please allow 48 business hours for a response.  Please remember that this is for non-urgent requests.  _______________________________________________________  Clarisa Kindred twice daily. Continue daily iron.  Your provider has requested that you go to the basement level for lab work before leaving today. Press "B" on the elevator. The lab is located at the first door on the left as you exit the elevator.  Due to recent changes in healthcare laws, you may see the results of your imaging and laboratory studies on MyChart before your provider has had a chance to review them.  We understand that in some cases there may be results that are confusing or concerning to you. Not all laboratory results come back in the same time frame and the provider may be waiting for multiple results in order to interpret others.  Please give [place] 48 hours in order for your provider to thoroughly review all the results before contacting the office for clarification of your results.    It was a pleasure to see you today!  Thank you for trusting me with your gastrointestinal care!

## 2022-07-18 NOTE — Progress Notes (Signed)
Assessment    Patient profile:  [name] [name] is a 61 y.o. female known to Dr. Silverio Decamp with a past medical history of arthritis, HTN, restless leg syndrome, history of kidney stones, IDA, GERD, large hiatal hernia with Cameron's lesions, small bowel AVM, and pan- ulcerative colitis. See PMH /PSH for additional history  # 61 yo female with recent hospitalization for recurrent iron deficiency, black emesis. Anemia most likely secondary to Cameron's lesion's found on inpatient EGD. Hgb improved from 7.7 to 8.7 with a u PRBCs and IV iron   # Pan -ulcerative colitis. Incidental finding on screening colonoscopy in 2019. Biopsies compatible with chronic, active colitis.  She took mesalamine for about a year. Asymptomatic off treatment since. No active disease on colonoscopy in April 2019   Plan   CBC today Continue daily iron Continue BID Pantoprazole Continue anti-reflux measures ( discussed).  May need CBC Q few months to monitor hgb since it is difficult to know if bleeding since stools dark on oral iron. Discussed that sometimes when actively bleeding stools may become more loose and frequent  Hopefully she will need come to hernia repair.     HPI    Chief complaint: hospital follow up    Hiya was was found on screening colonoscopy in 2019 to have pan ulcerative colitis.  Biopsies showed chronic active colitis. She was temporarily treated with mesalamine, symptoms improved medication was stopped.  She has been asymptomatic since.  We saw her back in the office for anemia in 2021.  EGD was remarkable for a large hiatal hernia.  Colonoscopy was normal.  Video capsule study remarkable for 1 small nonbleeding AVM at 47 minutes .   We saw Fallynn for hospital consultation 06/16/2022 for iron deficiency anemia, black emesis and intermittent black stools .  PCP had reduced her oral iron and at some point she became short of breath and went to ED.  Hgb was 7.7, down from 12 in October.   She was heme-negative at the time but there was documentation of heme positive stool a month prior .  Her B12 and folate were within normal limits .  Her ferritin was only 12.  Inpatient EGD remarkable for large hiatal hernia with Cameron's erosions, erosive gastropathy.  No H. pylori on gastric biopsies.  Duodenal biopsies unremarkable.  She received a unit of blood and also IV iron with improvement in hemoglobin to 8.7. Upon discharge she was asked to resume once daily oral iron  Interval History:  [name] [name] hasn't had any further episodes of black emesis. Stools dark on iron. She feels okay. Taking BID PPI and daily oral iron ( will update home med list). She had been having heartburn prior to admission but since PPI changed to Pantoprazole the heartburn has resolved. She is limiting caffeine to only one cup of coffee in the am. She goes to bed on an empty stomach and sleep with elevated HOH.   Previous GI Evaluation   Jan 2019 colonoscopy  -Scattered erythematous mucosa with erosions in the entire examined colon. Biopsied. - Non-bleeding internal hemorrhoids. - The examination was otherwise normal.   April 2021 EGD - Z-line regular, 35 cm from the incisors. - No gross lesions in esophagus. - Large hiatal hernia. - Normal examined duodenum. - No specimens collected  April 2021 colonoscopy  - Normal mucosa in the entire examined colon. Biopsied. - The examined portion of the ileum was normal  April 2021 Video capsule -Complete study with adequate  prep -1 small AVM, nonbleeding at 47 minutes  Nov 2023 EGD - Normal esophagus. - Large hiatal hernia. - Camerons erosions: erosive gastropathy, gastritis with no bleeding and no stigmata of recent bleeding associated with the hiatal hernia. Biopsied -Normal duodenal bulb and second portion of duodenum FINAL MICROSCOPIC DIAGNOSIS:   A. DUODENUM, BIOPSY:  - Benign small bowel mucosa with no significant pathologic changes   B. STOMACH,  BIOPSY:  - Gastric oxyntic mucosa with mild reactive changes  - Negative for H. pylori on HE stain  - Negative for intestinal metaplasia or malignancy     Labs:     Latest Ref Rng & Units 06/17/2022    5:12 AM 06/16/2022    1:08 PM 06/16/2022    5:00 AM  CBC  WBC 4.0 - 10.5 K/uL 5.6     Hemoglobin 12.0 - 15.0 g/dL 8.7  7.9  7.7   Hematocrit 36.0 - 46.0 % 29.0  27.5  26.8   Platelets 150 - 400 K/uL 235          Latest Ref Rng & Units 05/12/2022   12:11 PM 06/14/2019    6:10 PM 07/15/2018   11:39 AM  Hepatic Function  Total Protein 6.5 - 8.1 g/dL 7.7  6.9  7.2   Albumin 3.5 - 5.0 g/dL 4.6  3.9  4.4   AST 15 - 41 U/L _0 ALT 0 - 44 U/L _1 Alk Phosphatase 38 - 126 U/L 108  81  101   Total Bilirubin 0.3 - 1.2 mg/dL 0.5  0.3  0.5      Past Medical History:  Diagnosis Date   Allergy    Anemia 07/2017   on meds   Arthritis    Asthma    Black-out (not amnesia) 06/2017   black out spells due to anemia!   Blood transfusion without reported diagnosis    Pt thinks she had 3 units blood in Dec, 2018   GERD (gastroesophageal reflux disease)    History of kidney stones    Kidney stones    Migraine    Restless leg syndrome    Swelling of lower limb    bialteral lower leg swelling    Past Surgical History:  Procedure Laterality Date   BIOPSY  06/16/2022   Procedure: BIOPSY;  Surgeon: Ladene Artist, MD;  Location: [facility];  Service: Gastroenterology;;   COLONOSCOPY     ESOPHAGOGASTRODUODENOSCOPY (EGD) WITH PROPOFOL N/A 06/16/2022   Procedure: ESOPHAGOGASTRODUODENOSCOPY (EGD) WITH PROPOFOL;  Surgeon: Ladene Artist, MD;  Location: [facility];  Service: Gastroenterology;  Laterality: N/A;   MULTIPLE TOOTH EXTRACTIONS     right eye lid surgery      TOTAL KNEE ARTHROPLASTY Left 01/26/2017   Procedure: LEFT TOTAL KNEE ARTHROPLASTY;  Surgeon: Gaynelle Arabian, MD;  Location: [facility];  Service: Orthopedics;  Laterality: Left;  with block   TOTAL KNEE  ARTHROPLASTY Right 10/26/2017   Procedure: RIGHT TOTAL KNEE ARTHROPLASTY;  Surgeon: Gaynelle Arabian, MD;  Location: [facility];  Service: Orthopedics;  Laterality: Right;   TUBAL LIGATION      Current Medications, Allergies, Family History and Social History were reviewed in [employer] record.     Current Outpatient Medications  Medication Sig Dispense Refill   cetirizine (ZYRTEC) 10 MG tablet Take 10 mg by mouth at bedtime.     ferrous sulfate 325 (65 FE) MG tablet Take 162.5 mg by  mouth See admin instructions. Every 3 days     furosemide (LASIX) 20 MG tablet Take 40 mg by mouth as needed for fluid or edema.     gabapentin (NEURONTIN) 400 MG capsule Take 800 mg by mouth at bedtime.      pantoprazole (PROTONIX) 40 MG tablet Take 1 tablet (40 mg total) by mouth 2 (two) times daily. For 4 weeks then 1 tablet daily 60 tablet 2   rosuvastatin (CRESTOR) 10 MG tablet Take 10 mg by mouth daily.     traZODone (DESYREL) 100 MG tablet Take 50 mg by mouth at bedtime.     Turmeric (QC TUMERIC COMPLEX) 500 MG CAPS Take 500 mg by mouth daily.     vitamin E 400 UNIT capsule Take 400 Units by mouth daily.     XIIDRA 5 % SOLN Apply 1 drop to eye 2 (two) times daily.     No current facility-administered medications for this visit.    Review of Systems: No chest pain. No shortness of breath. No urinary complaints.    Physical Exam  Wt Readings from Last 3 Encounters:  06/16/22 181 lb 10.5 oz (82.4 kg)  05/12/22 200 lb (90.7 kg)  11/21/19 178 lb (80.7 kg)    LMP 08/25/2011  Vitals reviewed and stable. BMI 32 Constitutional:  Generally well appearing female in no acute distress. Psychiatric: Pleasant. Normal mood and affect. Behavior is normal. EENT: Pupils normal.  Conjunctivae are normal. No scleral icterus. Neck supple.  Cardiovascular: Normal rate, regular rhythm, soft murmur present  Pulmonary/chest: Effort normal and breath sounds normal. No wheezing, rales or  rhonchi. Abdominal: Soft, nondistended, nontender. Bowel sounds active throughout. There are no masses palpable. No hepatomegaly. Neurological: Alert and oriented to person place and time. Musculoskeletal: No edema Skin: Skin is warm and dry. No rashes noted.  Tye Savoy, NP  07/18/2022, 8:23 AM

## 2022-07-23 ENCOUNTER — Other Ambulatory Visit: Payer: Self-pay

## 2022-07-23 DIAGNOSIS — D649 Anemia, unspecified: Secondary | ICD-10-CM

## 2022-07-23 DIAGNOSIS — K921 Melena: Secondary | ICD-10-CM

## 2022-07-23 DIAGNOSIS — Z8639 Personal history of other endocrine, nutritional and metabolic disease: Secondary | ICD-10-CM

## 2022-08-25 ENCOUNTER — Telehealth: Payer: Self-pay

## 2022-08-25 NOTE — Telephone Encounter (Signed)
-----  Message from Marice Potter, RN sent at 07/23/2022 11:15 AM EST ----- Pt needs CBC and IBC+Ferritin. Orders in epic.

## 2022-08-25 NOTE — Telephone Encounter (Signed)
Spoke with pt and let pt know about lab orders. Pt verbalized understanding and had no other concerns at end of call.

## 2022-09-03 ENCOUNTER — Other Ambulatory Visit (INDEPENDENT_AMBULATORY_CARE_PROVIDER_SITE_OTHER): Payer: Medicare HMO

## 2022-09-03 DIAGNOSIS — D649 Anemia, unspecified: Secondary | ICD-10-CM | POA: Diagnosis not present

## 2022-09-03 DIAGNOSIS — K921 Melena: Secondary | ICD-10-CM

## 2022-09-03 DIAGNOSIS — Z8639 Personal history of other endocrine, nutritional and metabolic disease: Secondary | ICD-10-CM | POA: Diagnosis not present

## 2022-09-03 LAB — CBC WITH DIFFERENTIAL/PLATELET
Basophils Absolute: 0.1 10*3/uL (ref 0.0–0.1)
Basophils Relative: 0.9 % (ref 0.0–3.0)
Eosinophils Absolute: 0.2 10*3/uL (ref 0.0–0.7)
Eosinophils Relative: 2.5 % (ref 0.0–5.0)
HCT: 41.6 % (ref 36.0–46.0)
Hemoglobin: 13.8 g/dL (ref 12.0–15.0)
Lymphocytes Relative: 18.3 % (ref 12.0–46.0)
Lymphs Abs: 1.2 10*3/uL (ref 0.7–4.0)
MCHC: 33.3 g/dL (ref 30.0–36.0)
MCV: 90.2 fl (ref 78.0–100.0)
Monocytes Absolute: 0.4 10*3/uL (ref 0.1–1.0)
Monocytes Relative: 5.4 % (ref 3.0–12.0)
Neutro Abs: 4.9 10*3/uL (ref 1.4–7.7)
Neutrophils Relative %: 72.9 % (ref 43.0–77.0)
Platelets: 221 10*3/uL (ref 150.0–400.0)
RBC: 4.61 Mil/uL (ref 3.87–5.11)
RDW: 16.2 % — ABNORMAL HIGH (ref 11.5–15.5)
WBC: 6.7 10*3/uL (ref 4.0–10.5)

## 2022-09-03 LAB — IBC + FERRITIN
Ferritin: 42.2 ng/mL (ref 10.0–291.0)
Iron: 145 ug/dL (ref 42–145)
Saturation Ratios: 43.3 % (ref 20.0–50.0)
TIBC: 334.6 ug/dL (ref 250.0–450.0)
Transferrin: 239 mg/dL (ref 212.0–360.0)

## 2022-09-26 ENCOUNTER — Other Ambulatory Visit: Payer: Self-pay

## 2022-09-26 ENCOUNTER — Telehealth: Payer: Self-pay | Admitting: Nurse Practitioner

## 2022-09-26 MED ORDER — PANTOPRAZOLE SODIUM 40 MG PO TBEC: 40.0000 mg | DELAYED_RELEASE_TABLET | Freq: Every day | ORAL | 3 refills | Status: DC

## 2022-09-26 NOTE — Telephone Encounter (Signed)
Called the patient back to confirm her pharmacy. No answer. Pantoprazole last filled at [facility] in [facility], [place]. Left the patient a message that the prescription has been refilled at the [facility].

## 2022-09-26 NOTE — Telephone Encounter (Signed)
Inbound call from pt, she is trying to get a refill for Pantropazole medication, stated that pharmacy need the authorization..Please advise

## 2022-10-02 ENCOUNTER — Other Ambulatory Visit: Payer: Medicare HMO

## 2022-10-29 ENCOUNTER — Telehealth: Payer: Self-pay | Admitting: Nurse Practitioner

## 2022-10-29 NOTE — Telephone Encounter (Signed)
Inbound call from patient, states she has been taking pantoprazole and is now having diarrhea, and loss of appetite. Is requesting to speak with a nurse to further advise on different medication options.

## 2022-10-30 NOTE — Telephone Encounter (Signed)
DOD  Patient of Dr Woodward Ku last seen by Tye Savoy calling with complaints of diarrhea and nausea. Symptoms started 3 days ago. She does not want to eat and "I don't even want my coffee cause it makes me sick and have the diarrhea." History of microscopic colitis in 2019. She does not know of any sick contacts. She has not had fever, dark or bloody stools and has not vomited. She willing to come for labs if needed. Please advise.

## 2022-10-30 NOTE — Telephone Encounter (Signed)
Odds favor some sort of acute infectious process  Suggest BRAT diet Imodium prn Hydrate   If she fails to resolve or worsens would go to urgent care or ED

## 2022-11-03 NOTE — Telephone Encounter (Signed)
Patient advised. She agrees to this plan. Follow up appointment scheduled.

## 2022-12-12 ENCOUNTER — Other Ambulatory Visit: Payer: Self-pay | Admitting: Nurse Practitioner

## 2022-12-15 ENCOUNTER — Other Ambulatory Visit: Payer: Self-pay

## 2022-12-15 ENCOUNTER — Inpatient Hospital Stay (HOSPITAL_COMMUNITY): Payer: 59

## 2022-12-15 ENCOUNTER — Encounter (HOSPITAL_COMMUNITY): Payer: Self-pay | Admitting: Internal Medicine

## 2022-12-15 ENCOUNTER — Observation Stay (HOSPITAL_COMMUNITY)
Admission: EM | Admit: 2022-12-15 | Discharge: 2022-12-16 | Disposition: A | Discharge status: Home / Self Care | Payer: 59 | Attending: Internal Medicine | Admitting: Internal Medicine

## 2022-12-15 DIAGNOSIS — Z96653 Presence of artificial knee joint, bilateral: Secondary | ICD-10-CM | POA: Insufficient documentation

## 2022-12-15 DIAGNOSIS — K920 Hematemesis: Secondary | ICD-10-CM | POA: Diagnosis present

## 2022-12-15 DIAGNOSIS — D62 Acute posthemorrhagic anemia: Secondary | ICD-10-CM | POA: Insufficient documentation

## 2022-12-15 DIAGNOSIS — K922 Gastrointestinal hemorrhage, unspecified: Secondary | ICD-10-CM | POA: Diagnosis not present

## 2022-12-15 DIAGNOSIS — K259 Gastric ulcer, unspecified as acute or chronic, without hemorrhage or perforation: Secondary | ICD-10-CM | POA: Insufficient documentation

## 2022-12-15 DIAGNOSIS — K649 Unspecified hemorrhoids: Secondary | ICD-10-CM

## 2022-12-15 DIAGNOSIS — M1990 Unspecified osteoarthritis, unspecified site: Secondary | ICD-10-CM

## 2022-12-15 DIAGNOSIS — J45909 Unspecified asthma, uncomplicated: Secondary | ICD-10-CM | POA: Diagnosis not present

## 2022-12-15 DIAGNOSIS — K8020 Calculus of gallbladder without cholecystitis without obstruction: Secondary | ICD-10-CM

## 2022-12-15 DIAGNOSIS — K440 Diaphragmatic hernia with obstruction, without gangrene: Secondary | ICD-10-CM

## 2022-12-15 DIAGNOSIS — Z79899 Other long term (current) drug therapy: Secondary | ICD-10-CM | POA: Diagnosis not present

## 2022-12-15 DIAGNOSIS — D649 Anemia, unspecified: Secondary | ICD-10-CM

## 2022-12-15 DIAGNOSIS — K921 Melena: Secondary | ICD-10-CM | POA: Insufficient documentation

## 2022-12-15 DIAGNOSIS — K449 Diaphragmatic hernia without obstruction or gangrene: Secondary | ICD-10-CM | POA: Insufficient documentation

## 2022-12-15 LAB — CBC WITH DIFFERENTIAL/PLATELET
Abs Immature Granulocytes: 0.03 10*3/uL (ref 0.00–0.07)
Basophils Absolute: 0 10*3/uL (ref 0.0–0.1)
Basophils Relative: 1 %
Eosinophils Absolute: 0 10*3/uL (ref 0.0–0.5)
Eosinophils Relative: 0 %
HCT: 26.3 % — ABNORMAL LOW (ref 36.0–46.0)
Hemoglobin: 7.7 g/dL — ABNORMAL LOW (ref 12.0–15.0)
Immature Granulocytes: 0 %
Lymphocytes Relative: 8 %
Lymphs Abs: 0.7 10*3/uL (ref 0.7–4.0)
MCH: 30.7 pg (ref 26.0–34.0)
MCHC: 29.3 g/dL — ABNORMAL LOW (ref 30.0–36.0)
MCV: 104.8 fL — ABNORMAL HIGH (ref 80.0–100.0)
Monocytes Absolute: 0.4 10*3/uL (ref 0.1–1.0)
Monocytes Relative: 5 %
Neutro Abs: 7.2 10*3/uL (ref 1.7–7.7)
Neutrophils Relative %: 86 %
Platelets: 211 10*3/uL (ref 150–400)
RBC: 2.51 MIL/uL — ABNORMAL LOW (ref 3.87–5.11)
RDW: 14 % (ref 11.5–15.5)
WBC: 8.3 10*3/uL (ref 4.0–10.5)
nRBC: 0 % (ref 0.0–0.2)

## 2022-12-15 LAB — RETICULOCYTES
Immature Retic Fract: 32.9 % — ABNORMAL HIGH (ref 2.3–15.9)
RBC.: 2.35 MIL/uL — ABNORMAL LOW (ref 3.87–5.11)
Retic Count, Absolute: 136.8 10*3/uL (ref 19.0–186.0)
Retic Ct Pct: 5.8 % — ABNORMAL HIGH (ref 0.4–3.1)

## 2022-12-15 LAB — IRON AND TIBC
Iron: 25 ug/dL — ABNORMAL LOW (ref 28–170)
Saturation Ratios: 7 % — ABNORMAL LOW (ref 10.4–31.8)
TIBC: 356 ug/dL (ref 250–450)
UIBC: 331 ug/dL

## 2022-12-15 LAB — TYPE AND SCREEN: Antibody Screen: NEGATIVE

## 2022-12-15 LAB — TROPONIN I (HIGH SENSITIVITY)
Troponin I (High Sensitivity): <2 ng/L (ref <18)
Troponin I (High Sensitivity): <2 ng/L (ref <18)

## 2022-12-15 LAB — LACTIC ACID, PLASMA: Lactic Acid, Venous: 0.9 mmol/L (ref 0.5–1.9)

## 2022-12-15 LAB — COMPREHENSIVE METABOLIC PANEL
ALT: 14 U/L (ref 0–44)
AST: 18 U/L (ref 15–41)
Albumin: 3.6 g/dL (ref 3.5–5.0)
Alkaline Phosphatase: 69 U/L (ref 38–126)
Anion gap: 7 (ref 5–15)
BUN: 45 mg/dL — ABNORMAL HIGH (ref 8–23)
CO2: 19 mmol/L — ABNORMAL LOW (ref 22–32)
Calcium: 8.4 mg/dL — ABNORMAL LOW (ref 8.9–10.3)
Chloride: 111 mmol/L (ref 98–111)
Creatinine, Ser: 0.73 mg/dL (ref 0.44–1.00)
GFR, Estimated: >60 mL/min (ref >60)
Glucose, Bld: 109 mg/dL — ABNORMAL HIGH (ref 70–99)
Potassium: 4.4 mmol/L (ref 3.5–5.1)
Sodium: 137 mmol/L (ref 135–145)
Total Bilirubin: 0.5 mg/dL (ref 0.3–1.2)
Total Protein: 6.5 g/dL (ref 6.5–8.1)

## 2022-12-15 LAB — FERRITIN: Ferritin: 19 ng/mL (ref 11–307)

## 2022-12-15 LAB — PREPARE RBC (CROSSMATCH)

## 2022-12-15 LAB — LIPASE, BLOOD: Lipase: 31 U/L (ref 11–51)

## 2022-12-15 LAB — HEMOGLOBIN A1C
Hgb A1c MFr Bld: 3.7 % — ABNORMAL LOW (ref 4.8–5.6)
Mean Plasma Glucose: 59.49 mg/dL

## 2022-12-15 LAB — HEMOGLOBIN AND HEMATOCRIT, BLOOD
HCT: 24.5 % — ABNORMAL LOW (ref 36.0–46.0)
HCT: 22.9 % — ABNORMAL LOW (ref 36.0–46.0)
Hemoglobin: 7.2 g/dL — ABNORMAL LOW (ref 12.0–15.0)
Hemoglobin: 6.7 g/dL — CL (ref 12.0–15.0)

## 2022-12-15 LAB — C-REACTIVE PROTEIN: CRP: <0.5 mg/dL (ref <1.0)

## 2022-12-15 LAB — ABO/RH: ABO/RH(D): O POS

## 2022-12-15 LAB — TSH: TSH: 0.109 u[IU]/mL — ABNORMAL LOW (ref 0.350–4.500)

## 2022-12-15 LAB — BPAM RBC: Unit Type and Rh: 5100

## 2022-12-15 LAB — VITAMIN B12: Vitamin B-12: 461 pg/mL (ref 180–914)

## 2022-12-15 LAB — FOLATE: Folate: 23.8 ng/mL (ref >5.9)

## 2022-12-15 MED ORDER — SODIUM CHLORIDE 0.9% IV SOLUTION: Freq: Once | INTRAVENOUS | Status: AC

## 2022-12-15 MED ORDER — TURMERIC 500 MG PO CAPS: 500.0000 mg | ORAL_CAPSULE | Freq: Every day | ORAL | Status: DC

## 2022-12-15 MED ORDER — DEXTROSE-NACL 5-0.45 % IV SOLN: INTRAVENOUS | Status: DC

## 2022-12-15 MED ORDER — PANTOPRAZOLE 80MG IVPB - SIMPLE MED
80.0000 mg | Freq: Two times a day (BID) | INTRAVENOUS | Status: DC
  Administered 2022-12-15 – 2022-12-16 (×3): 80 mg via INTRAVENOUS
  Filled 2022-12-15: qty 80
  Filled 2022-12-15: qty 100
  Filled 2022-12-15 (×2): qty 80

## 2022-12-15 MED ORDER — SODIUM CHLORIDE 0.9 % IV SOLN: INTRAVENOUS | Status: DC

## 2022-12-15 MED ORDER — LIDOCAINE VISCOUS HCL 2 % MT SOLN
15.0000 mL | Freq: Once | OROMUCOSAL | Status: AC
  Administered 2022-12-15: 15 mL via ORAL
  Filled 2022-12-15: qty 15

## 2022-12-15 MED ORDER — ALBUTEROL SULFATE (2.5 MG/3ML) 0.083% IN NEBU: 2.5000 mg | INHALATION_SOLUTION | RESPIRATORY_TRACT | Status: DC | PRN

## 2022-12-15 MED ORDER — ONDANSETRON HCL 4 MG PO TABS: 4.0000 mg | ORAL_TABLET | Freq: Four times a day (QID) | ORAL | Status: DC | PRN

## 2022-12-15 MED ORDER — FERROUS SULFATE 325 (65 FE) MG PO TABS
325.0000 mg | ORAL_TABLET | Freq: Every day | ORAL | Status: DC
  Filled 2022-12-15: qty 1

## 2022-12-15 MED ORDER — ONDANSETRON HCL 4 MG/2ML IJ SOLN: 4.0000 mg | Freq: Four times a day (QID) | INTRAMUSCULAR | Status: DC | PRN

## 2022-12-15 MED ORDER — ONDANSETRON HCL 4 MG/2ML IJ SOLN
4.0000 mg | Freq: Once | INTRAMUSCULAR | Status: AC
  Administered 2022-12-15: 4 mg via INTRAVENOUS

## 2022-12-15 MED ORDER — ALUM & MAG HYDROXIDE-SIMETH 200-200-20 MG/5ML PO SUSP
30.0000 mL | Freq: Once | ORAL | Status: AC
  Administered 2022-12-15: 30 mL via ORAL
  Filled 2022-12-15: qty 30

## 2022-12-15 MED ORDER — ONDANSETRON HCL 4 MG/2ML IJ SOLN
4.0000 mg | Freq: Once | INTRAMUSCULAR | Status: DC
  Filled 2022-12-15: qty 2

## 2022-12-15 MED ORDER — SODIUM CHLORIDE 0.9% IV SOLUTION: Freq: Once | INTRAVENOUS | Status: DC

## 2022-12-15 NOTE — Progress Notes (Signed)
PHARMACIST - PHYSICIAN ORDER COMMUNICATION ° °CONCERNING: P&T Medication Policy on Herbal Medications ° °DESCRIPTION:  This patient’s order for:  Turmeric  has been noted. ° °This product(s) is classified as an “herbal” or natural product. °Due to a lack of definitive safety studies or FDA approval, nonstandard manufacturing practices, plus the potential risk of unknown drug-drug interactions while on inpatient medications, the Pharmacy and Therapeutics Committee does not permit the use of “herbal” or natural products of this type within [place]. °  °ACTION TAKEN: °The pharmacy department is unable to verify this order at this time and your patient has been informed of this safety policy. °Please reevaluate patient’s clinical condition at discharge and address if the herbal or natural product(s) should be resumed at that time. ° °

## 2022-12-15 NOTE — ED Triage Notes (Signed)
Pt reports waking up at 0430 with heartburn, had one episode of emesis pta. Also c/o dizziness.

## 2022-12-15 NOTE — Consult Note (Addendum)
Consultation  Referring Provider: ERMD/Countryman MD Primary Care Physician:  Rebekah Chesterfield, NP Primary Gastroenterologist:  Dr.Nandigam   Reason for Consultation:   acute GI bleed-  HPI: [name] [name] is a 62 y.o. female established with Dr. Lavon Paganini, who presented to the emergency room this afternoon after she had onset this morning at home of severe fatigue, and dizziness and lightheadedness trying to walk in the house.  She became nauseated, vomited what she describes as a large amount of black material, then had 1 episode of large-volume black stool.  She says she was feeling fine yesterday and has not noted any melena or hematochezia recently.  She is on ferrous sulfate 325 mg once daily. She denies any recent abdominal pain, heartburn indigestion or nausea.  Appetite has been good. She has been on pantoprazole 40 mg p.o. daily.  She had undergone EGD in November 2023 for iron deficiency anemia and heme positive stool and was found to have an 8 cm hiatal hernia with a few erosions in the gastric fundus associated with a hiatal hernia and otherwise negative exam.  After that she was initially on twice daily PPI for 3 months then transition to once daily. Last hemoglobin on 08/27/2022 was 13.8 Labs in the ER today WBC 8.3/hemoglobin 7.7/hematocrit 26.3/MCV 104/platelets 211 BUN 45/creatinine 0.73 LFTs within normal limit/lipase normal Troponin less than 2 She is also undergone prior workup in April 2021 for iron deficiency anemia and history of melena.  She had colonoscopy at that time which was a normal exam, biopsies of the colon were normal.  EGD April 2021 again showed a large hiatal hernia no active erosions noted at that time and otherwise negative exam. Capsule endoscopy was also done April 2021 with finding of 1 small nonbleeding AVM about 25 minutes beyond the first duodenal image. Colonoscopy in 2019 showed scattered erosions in the colon and biopsies revealed chronic  active colitis.  She was treated with Apriso for a short period of time.  Other medical issues include hypertension, osteoarthritis, restless leg syndrome, history of kidney stones,. Denies any use of aspirin or NSAIDs, no EtOH. Last imaging was CT of the abdomen and pelvis October 2023 showed tiny layering gallstones, no ductal dilation, normal-appearing liver, and a large hiatal hernia.  He fortunately has not had any further episodes of coffee-ground emesis or melena since arrival in the emergency room and is currently hemodynamically stable.   Past Medical History:  Diagnosis Date   Allergy    Anemia 07/2017   on meds   Arthritis    Asthma    Black-out (not amnesia) 06/2017   black out spells due to anemia!   Blood transfusion without reported diagnosis    Pt thinks she had 3 units blood in Dec, 2018   GERD (gastroesophageal reflux disease)    History of kidney stones    Kidney stones    Migraine    Restless leg syndrome    Swelling of lower limb    bialteral lower leg swelling    Past Surgical History:  Procedure Laterality Date   BIOPSY  06/16/2022   Procedure: BIOPSY;  Surgeon: Meryl Dare, MD;  Location: [facility];  Service: Gastroenterology;;   COLONOSCOPY     ESOPHAGOGASTRODUODENOSCOPY (EGD) WITH PROPOFOL N/A 06/16/2022   Procedure: ESOPHAGOGASTRODUODENOSCOPY (EGD) WITH PROPOFOL;  Surgeon: Meryl Dare, MD;  Location: [facility];  Service: Gastroenterology;  Laterality: N/A;   MULTIPLE TOOTH EXTRACTIONS     right eye lid  surgery      TOTAL KNEE ARTHROPLASTY Left 01/26/2017   Procedure: LEFT TOTAL KNEE ARTHROPLASTY;  Surgeon: Ollen Gross, MD;  Location: [facility];  Service: Orthopedics;  Laterality: Left;  with block   TOTAL KNEE ARTHROPLASTY Right 10/26/2017   Procedure: RIGHT TOTAL KNEE ARTHROPLASTY;  Surgeon: Ollen Gross, MD;  Location: [facility];  Service: Orthopedics;  Laterality: Right;   TUBAL LIGATION      Prior to Admission medications    Medication Sig Start Date End Date Taking? Authorizing Provider  cetirizine (ZYRTEC) 10 MG tablet Take 10 mg by mouth at bedtime.    [provider]  ferrous sulfate 325 (65 FE) MG tablet Take 162.5 mg by mouth See admin instructions. Every 3 days    [provider]  furosemide (LASIX) 20 MG tablet Take 40 mg by mouth as needed for fluid or edema.    [provider]  gabapentin (NEURONTIN) 400 MG capsule Take 800 mg by mouth at bedtime.     [provider]  pantoprazole (PROTONIX) 40 MG tablet TAKE 1 TABLET BY MOUTH EVERY DAY 12/12/22   Meredith Pel, NP  rosuvastatin (CRESTOR) 10 MG tablet Take 10 mg by mouth daily. 05/23/22   [provider]  traZODone (DESYREL) 100 MG tablet Take 50 mg by mouth at bedtime. 05/06/19   [provider]  Turmeric (QC TUMERIC COMPLEX) 500 MG CAPS Take 500 mg by mouth daily.    [provider]  vitamin E 400 UNIT capsule Take 400 Units by mouth daily.    [provider]  XIIDRA 5 % SOLN Apply 1 drop to eye 2 (two) times daily. 05/27/22   [provider]    Current Facility-Administered Medications  Medication Dose Route Frequency Provider Last Rate Last Admin   0.9 %  sodium chloride infusion (Manually program via Guardrails IV Fluids)   Intravenous Once Esterwood, Amy S, PA-C       dextrose 5 %-0.45 % sodium chloride infusion   Intravenous Continuous Esterwood, Amy S, PA-C       ondansetron (ZOFRAN) injection 4 mg  4 mg Intravenous Once Glyn Ade, MD       ondansetron (ZOFRAN) injection 4 mg  4 mg Intravenous Q6H PRN Esterwood, Amy S, PA-C       pantoprazole (PROTONIX) 80 mg /NS 100 mL IVPB  80 mg Intravenous Q12H Glyn Ade, MD   Stopped at 12/15/22 1501   Current Outpatient Medications  Medication Sig Dispense Refill   cetirizine (ZYRTEC) 10 MG tablet Take 10 mg by mouth at bedtime.     ferrous sulfate 325 (65 FE) MG tablet Take 162.5 mg by mouth See admin  instructions. Every 3 days     furosemide (LASIX) 20 MG tablet Take 40 mg by mouth as needed for fluid or edema.     gabapentin (NEURONTIN) 400 MG capsule Take 800 mg by mouth at bedtime.      pantoprazole (PROTONIX) 40 MG tablet TAKE 1 TABLET BY MOUTH EVERY DAY 90 tablet 0   rosuvastatin (CRESTOR) 10 MG tablet Take 10 mg by mouth daily.     traZODone (DESYREL) 100 MG tablet Take 50 mg by mouth at bedtime.     Turmeric (QC TUMERIC COMPLEX) 500 MG CAPS Take 500 mg by mouth daily.     vitamin E 400 UNIT capsule Take 400 Units by mouth daily.     XIIDRA 5 % SOLN Apply 1 drop to eye 2 (two) times daily.  Allergies as of 12/15/2022 - Review Complete 12/15/2022  Allergen Reaction Noted   Acetaminophen Other (See Comments) 08/31/2015   Penicillins Hives and Other (See Comments) 01/14/2017   Topamax [topiramate] Other (See Comments) 08/31/2015    Family History  Problem Relation Age of Onset   Hypertension Mother    Diabetes Mellitus II Mother    Hypertension Father    Diabetes Mellitus II Father    Colon polyps Father    Colon cancer Neg Hx    Esophageal cancer Neg Hx    Stomach cancer Neg Hx    Rectal cancer Neg Hx     Social History   Socioeconomic History   Marital status: Divorced    Spouse name: Not on file   Number of children: Not on file   Years of education: Not on file   Highest education level: Not on file  Occupational History   Not on file  Tobacco Use   Smoking status: Never   Smokeless tobacco: Never  Vaping Use   Vaping Use: Never used  Substance and Sexual Activity   Alcohol use: Not Currently   Drug use: No   Sexual activity: Yes    Birth control/protection: Surgical  Other Topics Concern   Not on file  Social History Narrative   Not on file   Social Determinants of Health   Financial Resource Strain: Not on file  Food Insecurity: No Food Insecurity (06/16/2022)   Hunger Vital Sign    Worried About Running Out of Food in the Last Year: Never  true    Ran Out of Food in the Last Year: Never true  Transportation Needs: No Transportation Needs (06/16/2022)   PRAPARE - Administrator, Civil Service (Medical): No    Lack of Transportation (Non-Medical): No  Physical Activity: Not on file  Stress: Not on file  Social Connections: Not on file  Intimate Partner Violence: Not At Risk (06/16/2022)   Humiliation, Afraid, Rape, and Kick questionnaire    Fear of Current or Ex-Partner: No    Emotionally Abused: No    Physically Abused: No    Sexually Abused: No    Review of Systems: Pertinent positive and negative review of systems were noted in the above HPI section.  All other review of systems was otherwise negative.   Physical Exam: Vital signs in last 24 hours: Temp:  [99.1 F (37.3 C)] 99.1 F (37.3 C) (05/13 1232) Pulse Rate:  [75-101] 86 (05/13 1500) Resp:  [15-20] 15 (05/13 1500) BP: (93-127)/(63-113) 117/63 (05/13 1500) SpO2:  [98 %-100 %] 100 % (05/13 1500) Weight:  [83.5 kg] 83.5 kg (05/13 1233)   General:   Alert,  Well-developed, well-nourished, older WF pleasant and cooperative in NAD Head:  Normocephalic and atraumatic. Eyes:  Sclera clear, no icterus.   Conjunctiva pale. Ears:  Normal auditory acuity. Nose:  No deformity, discharge,  or lesions. Mouth:  No deformity or lesions.   Neck:  Supple; no masses or thyromegaly. Lungs:  Clear throughout to auscultation.   No wheezes, crackles, or rhonchi.  Heart:  Regular rate and rhythm; no murmurs, clicks, rubs,  or gallops. Abdomen:  Soft,nontender, BS active,nonpalp mass or hsm.   Rectal:  not done - done per ER MD -gross melena Msk:  Symmetrical without gross deformities. . Pulses:  Normal pulses noted. Extremities:  Without clubbing or edema. Neurologic:  Alert and  oriented x4;  grossly normal neurologically. Skin:  Intact without significant lesions or rashes.. Psych:  Alert and cooperative. Normal mood and affect.  Intake/Output from previous  day: No intake/output data recorded. Intake/Output this shift: Total I/O In: 100 [IV Piggyback:100] Out: -   Lab Results: Recent Labs    12/15/22 1336  WBC 8.3  HGB 7.7*  HCT 26.3*  PLT 211   BMET Recent Labs    12/15/22 1336  NA 137  K 4.4  CL 111  CO2 19*  GLUCOSE 109*  BUN 45*  CREATININE 0.73  CALCIUM 8.4*   LFT Recent Labs    12/15/22 1336  PROT 6.5  ALBUMIN 3.6  AST 18  ALT 14  ALKPHOS 69  BILITOT 0.5   PT/INR No results for input(s): "LABPROT", "INR" in the last 72 hours. Hepatitis Panel No results for input(s): "HEPBSAG", "HCVAB", "HEPAIGM", "HEPBIGM" in the last 72 hours.    IMPRESSION:  #46 62 year old white female with acute upper GI bleed, onset this morning with coffee-ground emesis and melena associated with lightheadedness and dizziness. This is in the setting of chronic PPI therapy on Protonix once daily, and known 8 cm hiatal hernia with previous history of Cameron erosions. She has also had prior history of iron deficiency anemia and heme positive stool for which she has undergone full workup as outlined above last done in 2021 with EGD/colonoscopy and capsule endoscopy which did show 1 small nonbleeding AVM about 25 minutes beyond the first duodenal image.  Etiology of current episode of upper GI bleeding is not clear, rule out Sheria Lang erosions/ulcerations with bleed, rule out peptic ulcer disease, rule out AVM  #2 anemia acute secondary to acute GI bleeding macrocytic-rule out component of B12/folate deficiency, denies EtOH  #3 history of hypertension #4.  Osteoarthritis #5.  Cholelithiasis documented on CT  Plan; clear liquids tonight, n.p.o. after midnight IV PPI infusion Zofran every 6 hours as needed for nausea Patient has been scheduled for EGD, hopefully tomorrow a.m.  Procedure has been discussed in detail with the patient including indications risk and benefits and she is agreeable to proceed. Every 6 hour hemoglobins and  would transfuse for any drop in hemoglobin overnight, and plan to keep hemoglobin above 7.5  Will check anemia panel/iron studies B12 folate prior to any transfusions Other recommendations pending results of above. GI will follow with you   Amy Esterwood PA-C  12/15/2022, 3:56 PM  GI ATTENDING  History, laboratories, x-rays, multiple prior endoscopy reports reviewed.  Patient personally seen and examined in the emergency room.  Agree with comprehensive consultation note as outlined above.  She has a history of recurrent iron deficiency anemia felt most likely secondary to [facility] erosions associated with a large hiatal hernia.  Presents now with presents with progressive fatigue and obvious upper GI bleeding with coffee-ground emesis and melena.  Hemoglobin down 6 g from January of this year.  Hemodynamically stable.  In addition to [facility] erosions, in the past, she did have small nonbleeding AVM on capsule study.  She does stay on chronic iron as well as chronic PPI therapies.  No NSAIDs.  Agree with recommendations as outlined above.  Plans tomorrow for upper endoscopy with possible conversion to small bowel enteroscopy.The nature of the procedure, as well as the risks, benefits, and alternatives were carefully and thoroughly reviewed with the patient. Ample time for discussion and questions allowed. The patient understood, was satisfied, and agreed to proceed.  Dr. Myrtie Neither to perform.  Patient aware.  Wilhemina Bonito. Eda Keys., M.D.  [facility] Division of Gastroenterology

## 2022-12-15 NOTE — ED Provider Notes (Signed)
[place] EMERGENCY DEPARTMENT AT [facility] Provider Note   CSN: [id number] Arrival date & time: 12/15/22  1226     History Chief Complaint  Patient presents with   Emesis    HPI [name] [name] is a 62 y.o. female presenting for chief complaint of coffee-ground emesis.  She is a 62 year old female with an extensive medical history.  States that last night she had severe coffee-ground appearing emesis.  3 episodes.  History of similar with gastritis on pantoprazole at home twice daily.  Thinks she has been compliant but not 100% confident. States that this morning she comes in because of development of intermittent melena since her last episode.  Endorses multiple upper and lower GI series in the past.  States that they are historically nondiagnostic. Is no longer on blood thinners nor antiplatelets. Patient's recorded medical, surgical, social, medication list and allergies were reviewed in the Snapshot window as part of the initial history.   Review of Systems   Review of Systems  Constitutional:  Positive for fatigue. Negative for chills and fever.  HENT:  Negative for ear pain and sore throat.   Eyes:  Negative for pain and visual disturbance.  Respiratory:  Negative for cough and shortness of breath.   Cardiovascular:  Negative for chest pain and palpitations.  Gastrointestinal:  Positive for blood in stool and nausea. Negative for abdominal pain and vomiting.  Genitourinary:  Negative for dysuria and hematuria.  Musculoskeletal:  Negative for arthralgias and back pain.  Skin:  Negative for color change and rash.  Neurological:  Positive for light-headedness. Negative for seizures and syncope.  All other systems reviewed and are negative.   Physical Exam Updated Vital Signs BP 117/63   Pulse 86   Temp 99.1 F (37.3 C) (Oral)   Resp 15   Ht 5\' 5"  (1.651 m)   Wt 83.5 kg   LMP 08/25/2011   SpO2 100%   BMI 30.62 kg/m  Physical Exam Vitals and nursing  note reviewed. Exam conducted with a chaperone present.  Constitutional:      General: She is not in acute distress.    Appearance: She is well-developed.  HENT:     Head: Normocephalic and atraumatic.  Eyes:     Conjunctiva/sclera: Conjunctivae normal.  Cardiovascular:     Rate and Rhythm: Normal rate and regular rhythm.     Heart sounds: No murmur heard. Pulmonary:     Effort: Pulmonary effort is normal. No respiratory distress.     Breath sounds: Normal breath sounds.  Abdominal:     General: There is no distension.     Palpations: Abdomen is soft.     Tenderness: There is no abdominal tenderness. There is no right CVA tenderness or left CVA tenderness.  Genitourinary:    Comments: Obvious coffee grounds on exam. Musculoskeletal:        General: No swelling or tenderness. Normal range of motion.     Cervical back: Neck supple.  Skin:    General: Skin is warm and dry.  Neurological:     General: No focal deficit present.     Mental Status: She is alert and oriented to person, place, and time. Mental status is at baseline.     Cranial Nerves: No cranial nerve deficit.      ED Course/ Medical Decision Making/ A&P    Procedures Procedures   Medications Ordered in ED Medications  ondansetron (ZOFRAN) injection 4 mg (4 mg Intravenous Not Given 12/15/22  1321)  pantoprazole (PROTONIX) 80 mg /NS 100 mL IVPB (0 mg Intravenous Stopped 12/15/22 1501)  ondansetron (ZOFRAN) injection 4 mg (4 mg Intravenous Given 12/15/22 1336)  alum & mag hydroxide-simeth (MAALOX/MYLANTA) 200-200-20 MG/5ML suspension 30 mL (30 mLs Oral Given 12/15/22 1322)    And  lidocaine (XYLOCAINE) 2 % viscous mouth solution 15 mL (15 mLs Oral Given 12/15/22 1322)    Medical Decision Making:    [name] [name] is a 61 y.o. female who presented to the ED today with stool discoloration and lightheadness detailed above.     Complete initial physical exam performed, notably the patient  was with obvious melena.       Reviewed and confirmed nursing documentation for past medical history, family history, social history.    Initial Assessment:   With the patient's presentation of melena and lightheadedness, most likely diagnosis is UGIB. Other diagnoses were considered including (but not limited to) LGIB, critical anemia, metabolic disruption, ICH/ICM. These are considered less likely due to history of present illness and physical exam findings.   This is most consistent with an acute life/limb threatening illness complicated by underlying chronic conditions.  Initial Plan:  Screening labs including CBC and Metabolic panel to evaluate for infectious or metabolic etiology of disease.  CXR to evaluate for structural/infectious intrathoracic pathology.  EKG to evaluate for cardiac pathology. Objective evaluation as below reviewed with plan for close reassessment  Initial Study Results:   Laboratory  All laboratory results reviewed without evidence of clinically relevant pathology.   Exceptions include: HGB 7.7 from 13   EKG EKG was reviewed independently. Rate, rhythm, axis, intervals all examined and without medically relevant abnormality. ST segments without concerns for elevations.    Consults:  Case discussed with [facility].   Final Assessment and Plan:   [facility] was consulted and agreed with Protonix infusion and they will come to see the patient to provide recommendations for further evaluation.  Given downtrending hemoglobin, type and screen was started.  However patient is hemodynamically stable no acute distress.  Patient was informed of need for admission and arranged for hospitalist admission.   Disposition:   Based on the above findings, I believe this patient is stable for admission.    Patient/family educated about specific findings on our evaluation and explained exact reasons for admission.  Patient/family educated about clinical situation and time was allowed to answer questions.    Admission team communicated with and agreed with need for admission. Patient admitted. Patient ready to move at this time.     Emergency Department Medication Summary:   Medications  ondansetron [facility]) injection 4 mg (4 mg Intravenous Not Given 12/15/22 1321)  pantoprazole (PROTONIX) 80 mg /NS 100 mL IVPB (0 mg Intravenous Stopped 12/15/22 1501)  ondansetron (ZOFRAN) injection 4 mg (4 mg Intravenous Given 12/15/22 1336)  alum & mag hydroxide-simeth (MAALOX/MYLANTA) 200-200-20 MG/5ML suspension 30 mL (30 mLs Oral Given 12/15/22 1322)    And  lidocaine (XYLOCAINE) 2 % viscous mouth solution 15 mL (15 mLs Oral Given 12/15/22 1322)         Clinical Impression:  1. Melena   2. Anemia, unspecified type      Admit   Final Clinical Impression(s) / ED Diagnoses Final diagnoses:  Melena  Anemia, unspecified type    Rx / DC Orders ED Discharge Orders     None         Glyn Ade, MD 12/15/22 1515

## 2022-12-15 NOTE — H&P (Signed)
History and Physical    [name] [name] YNW:[id number] DOB: 07/07/1961 DOA: 12/15/2022  PCP: Rebekah Chesterfield, NP  Patient coming from: home  I have personally briefly reviewed patient's old medical records in [facility]  Chief Complaint:  hematemesis x 3  HPI: [name] [name] is a 62 y.o. 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 Patient at that time required 1 unit prbc and was discharged on  protonix. Patient states since then she has done well until 3 days ago she started to notice fatigue and then 2 days ago increase sob as well intermittent nausea. She states this am she had episodes of hematemesis  x3 and melena x1 that started around 6:30 am which where associated feelings of presyncope and increasing fatigue.  She states he immediately called her family member who then drove her to ED.  She denies any associated chest pressure, or episode of syncope. She also denies abdominal pain. She notes no use of nsaids or blood thinners or etoh.   ED Course:  Vs: tmx 99.1, hr 101, rr 19 , sat 100%  BP93/67 repeat 126/63 Wbc: 8.3, hgb 7.7 ( 13.8, 4 mo ago ), mcv 104.8, plt 211 Na 137, K 4.4 , cL 111, bicarb 60m, glucose109, cr 0.73  Lipase 31   CE<2  EKG: Sinus rhythm  no hyperacute st -t wave changes  Tx zofran 4mg , maalox, lidocaine, protonix iv   Review of Systems: As per HPI otherwise 10 point review of systems negative.   Past Medical History:  Diagnosis Date   Allergy    Anemia 07/2017   on meds   Arthritis    Asthma    Black-out (not amnesia) 06/2017   black out spells due to anemia!   Blood transfusion without reported diagnosis    Pt thinks she had 3 units blood in Dec, 2018   GERD (gastroesophageal reflux disease)    History of kidney stones     Kidney stones    Migraine    Restless leg syndrome    Swelling of lower limb    bialteral lower leg swelling    Past Surgical History:  Procedure Laterality Date   BIOPSY  06/16/2022   Procedure: BIOPSY;  Surgeon: Meryl Dare, MD;  Location: [facility];  Service: Gastroenterology;;   COLONOSCOPY     ESOPHAGOGASTRODUODENOSCOPY (EGD) WITH PROPOFOL N/A 06/16/2022   Procedure: ESOPHAGOGASTRODUODENOSCOPY (EGD) WITH PROPOFOL;  Surgeon: Meryl Dare, MD;  Location: [facility];  Service: Gastroenterology;  Laterality: N/A;   MULTIPLE TOOTH EXTRACTIONS     right eye lid surgery      TOTAL KNEE ARTHROPLASTY Left 01/26/2017   Procedure: LEFT TOTAL KNEE ARTHROPLASTY;  Surgeon: Ollen Gross, MD;  Location: [facility] ORS;  Service: Orthopedics;  Laterality: Left;  with block   TOTAL KNEE ARTHROPLASTY Right 10/26/2017   Procedure: RIGHT TOTAL KNEE ARTHROPLASTY;  Surgeon: Ollen Gross, MD;  Location: [facility] OR[facility];  Service: Orthopedics;  Laterality: Right;   TUBAL LIGATION       reports that she has never smoked. She has never used smokeless tobacco. She reports that she does not currently use alcohol. She reports that she does not use drugs.  Allergies  Allergen Reactions   Acetaminophen Other (See Comments)    Very sleepy  Penicillins Hives and Other (See Comments)    FEB 2018 - broke out in hives Has patient had a PCN reaction causing immediate rash, facial/tongue/throat swelling, SOB or lightheadedness with hypotension: Yes Has patient had a PCN reaction causing severe rash involving mucus membranes or skin necrosis: No Has patient had a PCN reaction that required hospitalization: No Has patient had a PCN reaction occurring within the last 10 years: Yes If all of the above answers are "NO", then may proceed with Cephalosporin use.    Topamax [Topiramate] Other (See Comments)    Kidney stones    Family History  Problem Relation Age of Onset   Hypertension Mother    Diabetes Mellitus  II Mother    Hypertension Father    Diabetes Mellitus II Father    Colon polyps Father    Colon cancer Neg Hx    Esophageal cancer Neg Hx    Stomach cancer Neg Hx    Rectal cancer Neg Hx     Prior to Admission medications   Medication Sig Start Date End Date Taking? Authorizing Provider  cetirizine (ZYRTEC) 10 MG tablet Take 10 mg by mouth at bedtime.    [provider]  ferrous sulfate 325 (65 FE) MG tablet Take 162.5 mg by mouth See admin instructions. Every 3 days    [provider]  furosemide (LASIX) 20 MG tablet Take 40 mg by mouth as needed for fluid or edema.    [provider]  gabapentin (NEURONTIN) 400 MG capsule Take 800 mg by mouth at bedtime.     [provider]  pantoprazole (PROTONIX) 40 MG tablet TAKE 1 TABLET BY MOUTH EVERY DAY 12/12/22   Meredith Pel, NP  rosuvastatin (CRESTOR) 10 MG tablet Take 10 mg by mouth daily. 05/23/22   [provider]  traZODone (DESYREL) 100 MG tablet Take 50 mg by mouth at bedtime. 05/06/19   [provider]  Turmeric (QC TUMERIC COMPLEX) 500 MG CAPS Take 500 mg by mouth daily.    [provider]  vitamin E 400 UNIT capsule Take 400 Units by mouth daily.    [provider]  XIIDRA 5 % SOLN Apply 1 drop to eye 2 (two) times daily. 05/27/22   [provider]    Physical Exam: Vitals:   12/15/22 1415 12/15/22 1430 12/15/22 1500 12/15/22 1627  BP: 122/72 126/63 117/63   Pulse: 78 75 86   Resp: 20 18 15    Temp:    98.1 F (36.7 C)  TempSrc:    Oral  SpO2: 100% 98% 100%   Weight:      Height:        Constitutional: NAD, calm, comfortable Vitals:   12/15/22 1415 12/15/22 1430 12/15/22 1500 12/15/22 1627  BP: 122/72 126/63 117/63   Pulse: 78 75 86   Resp: 20 18 15    Temp:    98.1 F (36.7 C)  TempSrc:    Oral  SpO2: 100% 98% 100%   Weight:      Height:       Eyes: PERRL, lids and conjunctivae normal ENMT: Mucous membranes are moist. Posterior  pharynx clear of any exudate or lesions.Normal dentition.  Neck: normal, supple, no masses, no thyromegaly Respiratory: clear to auscultation bilaterally, no wheezing, no crackles. Normal respiratory effort. No accessory muscle use.  Cardiovascular: Regular rate and rhythm, no murmurs / rubs / gallops. No extremity edema. 2+ pedal pulses. Abdomen: no tenderness, no masses palpated. No hepatosplenomegaly. Bowel sounds  positive.  Musculoskeletal: no clubbing / cyanosis. No joint deformity upper and lower extremities. Good ROM, no contractures. Normal muscle tone.  Skin: no rashes, lesions, ulcers. No induration Neurologic: CN 2-12 grossly intact. Sensation intact, DTR normal. Strength 5/5 in all 4.  Psychiatric: Normal judgment and insight. Alert and oriented x 3. Normal mood.    Labs on Admission: I have personally reviewed following labs and imaging studies  CBC: Recent Labs  Lab 12/15/22 1336  WBC 8.3  NEUTROABS 7.2  HGB 7.7*  HCT 26.3*  MCV 104.8*  PLT 211   Basic Metabolic Panel: Recent Labs  Lab 12/15/22 1336  NA 137  K 4.4  CL 111  CO2 19*  GLUCOSE 109*  BUN 45*  CREATININE 0.73  CALCIUM 8.4*   GFR: Estimated Creatinine Clearance: 77.8 mL/min (by C-G formula based on SCr of 0.73 mg/dL). Liver Function Tests: Recent Labs  Lab 12/15/22 1336  AST 18  ALT 14  ALKPHOS 69  BILITOT 0.5  PROT 6.5  ALBUMIN 3.6   Recent Labs  Lab 12/15/22 1336  LIPASE 31   No results for input(s): "AMMONIA" in the last 168 hours. Coagulation Profile: No results for input(s): "INR", "PROTIME" in the last 168 hours. Cardiac Enzymes: No results for input(s): "CKTOTAL", "CKMB", "CKMBINDEX", "TROPONINI" in the last 168 hours. BNP (last 3 results) No results for input(s): "PROBNP" in the last 8760 hours. HbA1C: No results for input(s): "HGBA1C" in the last 72 hours. CBG: No results for input(s): "GLUCAP" in the last 168 hours. Lipid Profile: No results for input(s): "CHOL",  "HDL", "LDLCALC", "TRIG", "CHOLHDL", "LDLDIRECT" in the last 72 hours. Thyroid Function Tests: No results for input(s): "TSH", "T4TOTAL", "FREET4", "T3FREE", "THYROIDAB" in the last 72 hours. Anemia Panel: No results for input(s): "VITAMINB12", "FOLATE", "FERRITIN", "TIBC", "IRON", "RETICCTPCT" in the last 72 hours. Urine analysis:    Component Value Date/Time   COLORURINE YELLOW 05/12/2022 1335   APPEARANCEUR CLEAR 05/12/2022 1335   LABSPEC 1.021 05/12/2022 1335   PHURINE 7.5 05/12/2022 1335   GLUCOSEU NEGATIVE 05/12/2022 1335   HGBUR NEGATIVE 05/12/2022 1335   BILIRUBINUR NEGATIVE 05/12/2022 1335   KETONESUR NEGATIVE 05/12/2022 1335   PROTEINUR TRACE (A) 05/12/2022 1335   UROBILINOGEN 0.2 11/15/2013 1007   NITRITE NEGATIVE 05/12/2022 1335   LEUKOCYTESUR NEGATIVE 05/12/2022 1335    Radiological Exams on Admission: No results found.  EKG: Independently reviewed. See above  Assessment/Plan  Acute Gastrointestinal hemorrhage with melena  -admit to progressive care  - npo midnight  - patient seen by GI , noted rec ,  ppi iv bid  ,  with plans for edg in am   Low grade fever -check cxr in setting of n/v  -check ua - monitor fever curve and wbc count    Symptomatic anemia of blood loss  - hgb 7.7 , transfuse if less than 7.5 or patient has progressive symptoms  - monitor h/h q6h x 3  -check iron panel  -continue home iron   Hx of UC -s/p  treatment in remission    DVT prophylaxis: scd Code Status: full/ as discussed per patient wishes in event of cardiac arrest  Family Communication: none at bedside Disposition Plan: patient  expected to be admitted greater than 2 midnights  Consults called:  Dr Marina Goodell - GI [place] Admission status: sdu   Lurline Del MD [facility]   If 7PM-7AM, please contact night-coverage [email or web address] Password [facility]  12/15/2022, 4:32 PM

## 2022-12-15 NOTE — ED Notes (Addendum)
During ortho VS, pt felt fine for the laying and sitting portion. When the pt initially stood up, they became light-headed and very dizzy. Pt stated that it felt like the whole room was spinning. Pt did seemed SHOB during the standing portion. Pt attempted to finish the standing portion, but was unable to continue.

## 2022-12-15 NOTE — ED Notes (Signed)
ED TO INPATIENT HANDOFF REPORT  ED Nurse Name and Phone #: Thamas Jaegers Name/Age/Gender [name] [name] 62 y.o. female Room/Bed: WA22/WA22  Code Status   Code Status: Prior  Home/SNF/Other Home Patient oriented to: self, place, time, and situation Is this baseline? Yes   Triage Complete: Triage complete  Chief Complaint Upper GI bleed [K92.2]  Triage Note Pt reports waking up at 0430 with heartburn, had one episode of emesis pta. Also c/o dizziness.   Allergies Allergies  Allergen Reactions   Acetaminophen Other (See Comments)    Very sleepy   Penicillins Hives, Itching and Other (See Comments)    FEB 2018 - broke out in hives Has patient had a PCN reaction causing immediate rash, facial/tongue/throat swelling, SOB or lightheadedness with hypotension: Yes Has patient had a PCN reaction causing severe rash involving mucus membranes or skin necrosis: No Has patient had a PCN reaction that required hospitalization: No Has patient had a PCN reaction occurring within the last 10 years: Yes If all of the above answers are "NO", then may proceed with Cephalosporin use.    Topamax [Topiramate] Other (See Comments)    Kidney stones    Level of Care/Admitting Diagnosis ED Disposition     ED Disposition  Admit   Condition  --   Comment  Hospital Area: [facility] [[id number]]  Level of Care: Progressive [102]  Admit to Progressive based on following criteria: GI, ENDOCRINE disease patients with GI bleeding, acute liver failure or pancreatitis, stable with diabetic ketoacidosis or thyrotoxicosis (hypothyroid) state.  May admit patient to [facility] or [facility] if equivalent level of care is available:: No  Covid Evaluation: Asymptomatic - no recent exposure (last 10 days) testing not required  Diagnosis: Upper GI bleed [086578]  Admitting Physician: Lurline Del [[id number]]  Attending Physician: Lurline Del [[id number]]  Certification:: I  certify this patient will need inpatient services for at least 2 midnights  Estimated Length of Stay: 3          B Medical/Surgery History Past Medical History:  Diagnosis Date   Allergy    Anemia 07/2017   on meds   Arthritis    Asthma    Black-out (not amnesia) 06/2017   black out spells due to anemia!   Blood transfusion without reported diagnosis    Pt thinks she had 3 units blood in Dec, 2018   GERD (gastroesophageal reflux disease)    History of kidney stones    Kidney stones    Migraine    Restless leg syndrome    Swelling of lower limb    bialteral lower leg swelling   Past Surgical History:  Procedure Laterality Date   BIOPSY  06/16/2022   Procedure: BIOPSY;  Surgeon: Meryl Dare, MD;  Location: [facility];  Service: Gastroenterology;;   COLONOSCOPY     ESOPHAGOGASTRODUODENOSCOPY (EGD) WITH PROPOFOL N/A 06/16/2022   Procedure: ESOPHAGOGASTRODUODENOSCOPY (EGD) WITH PROPOFOL;  Surgeon: Meryl Dare, MD;  Location: [facility];  Service: Gastroenterology;  Laterality: N/A;   MULTIPLE TOOTH EXTRACTIONS     right eye lid surgery      TOTAL KNEE ARTHROPLASTY Left 01/26/2017   Procedure: LEFT TOTAL KNEE ARTHROPLASTY;  Surgeon: Ollen Gross, MD;  Location: [facility];  Service: Orthopedics;  Laterality: Left;  with block   TOTAL KNEE ARTHROPLASTY Right 10/26/2017   Procedure: RIGHT TOTAL KNEE ARTHROPLASTY;  Surgeon: Ollen Gross, MD;  Location: [facility];  Service: Orthopedics;  Laterality: Right;  TUBAL LIGATION       A IV Location/Drains/Wounds Patient Lines/Drains/Airways Status     Active Line/Drains/Airways     Name Placement date Placement time Site Days   Peripheral IV 12/15/22 22 G Posterior;Right Hand 12/15/22  1335  Hand  less than 1            Intake/Output Last 24 hours  Intake/Output Summary (Last 24 hours) at 12/15/2022 1745 Last data filed at 12/15/2022 1501 Gross per 24 hour  Intake 100 ml  Output --  Net 100 ml     Labs/Imaging Results for orders placed or performed during the hospital encounter of 12/15/22 (from the past 48 hour(s))  CBC with Differential     Status: Abnormal   Collection Time: 12/15/22  1:36 PM  Result Value Ref Range   WBC 8.3 4.0 - 10.5 K/uL   RBC 2.51 (L) 3.87 - 5.11 MIL/uL   Hemoglobin 7.7 (L) 12.0 - 15.0 g/dL   HCT 40.9 (L) 81.1 - 91.4 %   MCV 104.8 (H) 80.0 - 100.0 fL   MCH 30.7 26.0 - 34.0 pg   MCHC 29.3 (L) 30.0 - 36.0 g/dL   RDW 78.2 95.6 - 21.3 %   Platelets 211 150 - 400 K/uL   nRBC 0.0 0.0 - 0.2 %   Neutrophils Relative % 86 %   Neutro Abs 7.2 1.7 - 7.7 K/uL   Lymphocytes Relative 8 %   Lymphs Abs 0.7 0.7 - 4.0 K/uL   Monocytes Relative 5 %   Monocytes Absolute 0.4 0.1 - 1.0 K/uL   Eosinophils Relative 0 %   Eosinophils Absolute 0.0 0.0 - 0.5 K/uL   Basophils Relative 1 %   Basophils Absolute 0.0 0.0 - 0.1 K/uL   Immature Granulocytes 0 %   Abs Immature Granulocytes 0.03 0.00 - 0.07 K/uL    Comment: Performed at [facility], [address], [place], [place] [address]  Comprehensive metabolic panel     Status: Abnormal   Collection Time: 12/15/22  1:36 PM  Result Value Ref Range   Sodium 137 135 - 145 mmol/L   Potassium 4.4 3.5 - 5.1 mmol/L   Chloride 111 98 - 111 mmol/L   CO2 19 (L) 22 - 32 mmol/L   Glucose, Bld 109 (H) 70 - 99 mg/dL    Comment: Glucose reference range applies only to samples taken after fasting for at least 8 hours.   BUN 45 (H) 8 - 23 mg/dL   Creatinine, Ser 8.46 0.44 - 1.00 mg/dL   Calcium 8.4 (L) 8.9 - 10.3 mg/dL   Total Protein 6.5 6.5 - 8.1 g/dL   Albumin 3.6 3.5 - 5.0 g/dL   AST 18 15 - 41 U/L   ALT 14 0 - 44 U/L   Alkaline Phosphatase 69 38 - 126 U/L   Total Bilirubin 0.5 0.3 - 1.2 mg/dL   GFR, Estimated >96 >29 mL/min    Comment: (NOTE) Calculated using the CKD-EPI Creatinine Equation (2021)    Anion gap 7 5 - 15    Comment: Performed at [facility], [address],  [place], [place] [address]  Lipase, blood     Status: None   Collection Time: 12/15/22  1:36 PM  Result Value Ref Range   Lipase 31 11 - 51 U/L    Comment: Performed at [facility], [address], [place], [place] [address]  Troponin I (High Sensitivity)     Status: None  Collection Time: 12/15/22  1:36 PM  Result Value Ref Range   Troponin I (High Sensitivity) <2 <18 ng/L    Comment: (NOTE) Elevated high sensitivity troponin I (hsTnI) values and significant  changes across serial measurements may suggest ACS but many other  chronic and acute conditions are known to elevate hsTnI results.  Refer to the "Links" section for chest pain algorithms and additional  guidance. Performed at [facility], [address] [place], [place] [address]    No results found.  Pending Labs Unresulted Labs (From admission, onward)     Start     Ordered   12/15/22 1636  Hemoglobin and hematocrit, blood  Now then every 6 hours,   R      12/15/22 1643   12/15/22 1616  Vitamin B12  (Anemia Panel (PNL))  Once,   URGENT        12/15/22 1615   12/15/22 1616  Folate  (Anemia Panel (PNL))  Once,   URGENT        12/15/22 1615   12/15/22 1616  Iron and TIBC  (Anemia Panel (PNL))  Once,   URGENT        12/15/22 1615   12/15/22 1616  Ferritin  (Anemia Panel (PNL))  Once,   URGENT        12/15/22 1615   12/15/22 1616  Reticulocytes  (Anemia Panel (PNL))  Once,   URGENT        12/15/22 1615   12/15/22 1555  Prepare RBC (crossmatch)  (Blood Administration Adult)  Once,   R       Question Answer Comment  # of Units 2 units   Transfusion Indications Hemoglobin < 7 gm/dL and symptomatic   Number of Units to Keep Ahead 2 units ahead   Keep ahead indications Active Bleeding/GI Bleed   If emergent release call blood bank Not emergent release      12/15/22 1555   12/15/22 1554  Type and screen [facility]  (Blood Administration Adult)  Once,   STAT        Comments: [facility]   Question:  Release to patient  Answer:  Immediate   12/15/22 1555   12/15/22 1554  ABO/Rh  (Blood Administration Adult)  Once,   STAT        12/15/22 1555   12/15/22 1551  Hemoglobin and hematocrit, blood  Once,   STAT       Comments: Start 6 hours after last HGB - call mD for transfusion orders for HGB less  7 or less    12/15/22 1552            Vitals/Pain Today's Vitals   12/15/22 1430 12/15/22 1500 12/15/22 1545 12/15/22 1627  BP: 126/63 117/63 109/69   Pulse: 75 86 96   Resp: 18 15 20    Temp:    98.1 F (36.7 C)  TempSrc:    Oral  SpO2: 98% 100% 99%   Weight:      Height:      PainSc:        Isolation Precautions No active isolations  Medications Medications  ondansetron (ZOFRAN) injection 4 mg (4 mg Intravenous Not Given 12/15/22 1321)  pantoprazole (PROTONIX) 80 mg /NS 100 mL IVPB (0 mg Intravenous Stopped 12/15/22 1501)  ondansetron (ZOFRAN) injection 4 mg (has no administration in time range)  dextrose 5 %-0.45 % sodium chloride infusion (has no administration in time range)  0.9 %  sodium chloride infusion (Manually program via Guardrails IV Fluids) (has no administration in time range)  ondansetron (ZOFRAN) injection 4 mg (4 mg Intravenous Given 12/15/22 1336)  alum & mag hydroxide-simeth (MAALOX/MYLANTA) 200-200-20 MG/5ML suspension 30 mL (30 mLs Oral Given 12/15/22 1322)    And  lidocaine (XYLOCAINE) 2 % viscous mouth solution 15 mL (15 mLs Oral Given 12/15/22 1322)    Mobility walks     Focused Assessments     R Recommendations: See Admitting Provider Note  Report given to:   Additional Notes:

## 2022-12-15 NOTE — H&P (View-Only) (Signed)
  Consultation  Referring Provider: ERMD/Countryman MD Primary Care Physician:  Dickey, Kirkland M, NP Primary Gastroenterologist:  Dr.Nandigam   Reason for Consultation:   acute GI bleed-  HPI: [name] [name] is a 62 y.o. female established with Dr. Nandigam, who presented to the emergency room this afternoon after she had onset this morning at home of severe fatigue, and dizziness and lightheadedness trying to walk in the house.  She became nauseated, vomited what she describes as a large amount of black material, then had 1 episode of large-volume black stool.  She says she was feeling fine yesterday and has not noted any melena or hematochezia recently.  She is on ferrous sulfate 325 mg once daily. She denies any recent abdominal pain, heartburn indigestion or nausea.  Appetite has been good. She has been on pantoprazole 40 mg p.o. daily.  She had undergone EGD in November 2023 for iron deficiency anemia and heme positive stool and was found to have an 8 cm hiatal hernia with a few erosions in the gastric fundus associated with a hiatal hernia and otherwise negative exam.  After that she was initially on twice daily PPI for 3 months then transition to once daily. Last hemoglobin on 08/27/2022 was 13.8 Labs in the ER today WBC 8.3/hemoglobin 7.7/hematocrit 26.3/MCV 104/platelets 211 BUN 45/creatinine 0.73 LFTs within normal limit/lipase normal Troponin less than 2 She is also undergone prior workup in April 2021 for iron deficiency anemia and history of melena.  She had colonoscopy at that time which was a normal exam, biopsies of the colon were normal.  EGD April 2021 again showed a large hiatal hernia no active erosions noted at that time and otherwise negative exam. Capsule endoscopy was also done April 2021 with finding of 1 small nonbleeding AVM about 25 minutes beyond the first duodenal image. Colonoscopy in 2019 showed scattered erosions in the colon and biopsies revealed chronic  active colitis.  She was treated with Apriso for a short period of time.  Other medical issues include hypertension, osteoarthritis, restless leg syndrome, history of kidney stones,. Denies any use of aspirin or NSAIDs, no EtOH. Last imaging was CT of the abdomen and pelvis October 2023 showed tiny layering gallstones, no ductal dilation, normal-appearing liver, and a large hiatal hernia.  He fortunately has not had any further episodes of coffee-ground emesis or melena since arrival in the emergency room and is currently hemodynamically stable.   Past Medical History:  Diagnosis Date   Allergy    Anemia 07/2017   on meds   Arthritis    Asthma    Black-out (not amnesia) 06/2017   black out spells due to anemia!   Blood transfusion without reported diagnosis    Pt thinks she had 3 units blood in Dec, 2018   GERD (gastroesophageal reflux disease)    History of kidney stones    Kidney stones    Migraine    Restless leg syndrome    Swelling of lower limb    bialteral lower leg swelling    Past Surgical History:  Procedure Laterality Date   BIOPSY  06/16/2022   Procedure: BIOPSY;  Surgeon: Stark, Malcolm T, MD;  Location: [facility];  Service: Gastroenterology;;   COLONOSCOPY     ESOPHAGOGASTRODUODENOSCOPY (EGD) WITH PROPOFOL N/A 06/16/2022   Procedure: ESOPHAGOGASTRODUODENOSCOPY (EGD) WITH PROPOFOL;  Surgeon: Stark, Malcolm T, MD;  Location: [facility];  Service: Gastroenterology;  Laterality: N/A;   MULTIPLE TOOTH EXTRACTIONS     right eye lid   surgery      TOTAL KNEE ARTHROPLASTY Left 01/26/2017   Procedure: LEFT TOTAL KNEE ARTHROPLASTY;  Surgeon: Aluisio, Frank, MD;  Location: [facility];  Service: Orthopedics;  Laterality: Left;  with block   TOTAL KNEE ARTHROPLASTY Right 10/26/2017   Procedure: RIGHT TOTAL KNEE ARTHROPLASTY;  Surgeon: Aluisio, Frank, MD;  Location: [facility];  Service: Orthopedics;  Laterality: Right;   TUBAL LIGATION      Prior to Admission medications    Medication Sig Start Date End Date Taking? Authorizing Provider  cetirizine (ZYRTEC) 10 MG tablet Take 10 mg by mouth at bedtime.    [provider]  ferrous sulfate 325 (65 FE) MG tablet Take 162.5 mg by mouth See admin instructions. Every 3 days    [provider]  furosemide (LASIX) 20 MG tablet Take 40 mg by mouth as needed for fluid or edema.    [provider]  gabapentin (NEURONTIN) 400 MG capsule Take 800 mg by mouth at bedtime.     [provider]  pantoprazole (PROTONIX) 40 MG tablet TAKE 1 TABLET BY MOUTH EVERY DAY 12/12/22   Guenther, Paula M, NP  rosuvastatin (CRESTOR) 10 MG tablet Take 10 mg by mouth daily. 05/23/22   [provider]  traZODone (DESYREL) 100 MG tablet Take 50 mg by mouth at bedtime. 05/06/19   [provider]  Turmeric (QC TUMERIC COMPLEX) 500 MG CAPS Take 500 mg by mouth daily.    [provider]  vitamin E 400 UNIT capsule Take 400 Units by mouth daily.    [provider]  XIIDRA 5 % SOLN Apply 1 drop to eye 2 (two) times daily. 05/27/22   [provider]    Current Facility-Administered Medications  Medication Dose Route Frequency Provider Last Rate Last Admin   0.9 %  sodium chloride infusion (Manually program via Guardrails IV Fluids)   Intravenous Once Esterwood, Amy S, PA-C       dextrose 5 %-0.45 % sodium chloride infusion   Intravenous Continuous Esterwood, Amy S, PA-C       ondansetron (ZOFRAN) injection 4 mg  4 mg Intravenous Once Countryman, Chase, MD       ondansetron (ZOFRAN) injection 4 mg  4 mg Intravenous Q6H PRN Esterwood, Amy S, PA-C       pantoprazole (PROTONIX) 80 mg /NS 100 mL IVPB  80 mg Intravenous Q12H Countryman, Chase, MD   Stopped at 12/15/22 1501   Current Outpatient Medications  Medication Sig Dispense Refill   cetirizine (ZYRTEC) 10 MG tablet Take 10 mg by mouth at bedtime.     ferrous sulfate 325 (65 FE) MG tablet Take 162.5 mg by mouth See admin  instructions. Every 3 days     furosemide (LASIX) 20 MG tablet Take 40 mg by mouth as needed for fluid or edema.     gabapentin (NEURONTIN) 400 MG capsule Take 800 mg by mouth at bedtime.      pantoprazole (PROTONIX) 40 MG tablet TAKE 1 TABLET BY MOUTH EVERY DAY 90 tablet 0   rosuvastatin (CRESTOR) 10 MG tablet Take 10 mg by mouth daily.     traZODone (DESYREL) 100 MG tablet Take 50 mg by mouth at bedtime.     Turmeric (QC TUMERIC COMPLEX) 500 MG CAPS Take 500 mg by mouth daily.     vitamin E 400 UNIT capsule Take 400 Units by mouth daily.     XIIDRA 5 % SOLN Apply 1 drop to eye 2 (two) times daily.        Allergies as of 12/15/2022 - Review Complete 12/15/2022  Allergen Reaction Noted   Acetaminophen Other (See Comments) 08/31/2015   Penicillins Hives and Other (See Comments) 01/14/2017   Topamax [topiramate] Other (See Comments) 08/31/2015    Family History  Problem Relation Age of Onset   Hypertension Mother    Diabetes Mellitus II Mother    Hypertension Father    Diabetes Mellitus II Father    Colon polyps Father    Colon cancer Neg Hx    Esophageal cancer Neg Hx    Stomach cancer Neg Hx    Rectal cancer Neg Hx     Social History   Socioeconomic History   Marital status: Divorced    Spouse name: Not on file   Number of children: Not on file   Years of education: Not on file   Highest education level: Not on file  Occupational History   Not on file  Tobacco Use   Smoking status: Never   Smokeless tobacco: Never  Vaping Use   Vaping Use: Never used  Substance and Sexual Activity   Alcohol use: Not Currently   Drug use: No   Sexual activity: Yes    Birth control/protection: Surgical  Other Topics Concern   Not on file  Social History Narrative   Not on file   Social Determinants of Health   Financial Resource Strain: Not on file  Food Insecurity: No Food Insecurity (06/16/2022)   Hunger Vital Sign    Worried About Running Out of Food in the Last Year: Never  true    Ran Out of Food in the Last Year: Never true  Transportation Needs: No Transportation Needs (06/16/2022)   PRAPARE - Transportation    Lack of Transportation (Medical): No    Lack of Transportation (Non-Medical): No  Physical Activity: Not on file  Stress: Not on file  Social Connections: Not on file  Intimate Partner Violence: Not At Risk (06/16/2022)   Humiliation, Afraid, Rape, and Kick questionnaire    Fear of Current or Ex-Partner: No    Emotionally Abused: No    Physically Abused: No    Sexually Abused: No    Review of Systems: Pertinent positive and negative review of systems were noted in the above HPI section.  All other review of systems was otherwise negative.   Physical Exam: Vital signs in last 24 hours: Temp:  [99.1 F (37.3 C)] 99.1 F (37.3 C) (05/13 1232) Pulse Rate:  [75-101] 86 (05/13 1500) Resp:  [15-20] 15 (05/13 1500) BP: (93-127)/(63-113) 117/63 (05/13 1500) SpO2:  [98 %-100 %] 100 % (05/13 1500) Weight:  [83.5 kg] 83.5 kg (05/13 1233)   General:   Alert,  Well-developed, well-nourished, older WF pleasant and cooperative in NAD Head:  Normocephalic and atraumatic. Eyes:  Sclera clear, no icterus.   Conjunctiva pale. Ears:  Normal auditory acuity. Nose:  No deformity, discharge,  or lesions. Mouth:  No deformity or lesions.   Neck:  Supple; no masses or thyromegaly. Lungs:  Clear throughout to auscultation.   No wheezes, crackles, or rhonchi.  Heart:  Regular rate and rhythm; no murmurs, clicks, rubs,  or gallops. Abdomen:  Soft,nontender, BS active,nonpalp mass or hsm.   Rectal:  not done - done per ER MD -gross melena Msk:  Symmetrical without gross deformities. . Pulses:  Normal pulses noted. Extremities:  Without clubbing or edema. Neurologic:  Alert and  oriented x4;  grossly normal neurologically. Skin:  Intact without significant lesions or rashes.. Psych:    Alert and cooperative. Normal mood and affect.  Intake/Output from previous  day: No intake/output data recorded. Intake/Output this shift: Total I/O In: 100 [IV Piggyback:100] Out: -   Lab Results: Recent Labs    12/15/22 1336  WBC 8.3  HGB 7.7*  HCT 26.3*  PLT 211   BMET Recent Labs    12/15/22 1336  NA 137  K 4.4  CL 111  CO2 19*  GLUCOSE 109*  BUN 45*  CREATININE 0.73  CALCIUM 8.4*   LFT Recent Labs    12/15/22 1336  PROT 6.5  ALBUMIN 3.6  AST 18  ALT 14  ALKPHOS 69  BILITOT 0.5   PT/INR No results for input(s): "LABPROT", "INR" in the last 72 hours. Hepatitis Panel No results for input(s): "HEPBSAG", "HCVAB", "HEPAIGM", "HEPBIGM" in the last 72 hours.    IMPRESSION:  #1 61-year-old white female with acute upper GI bleed, onset this morning with coffee-ground emesis and melena associated with lightheadedness and dizziness. This is in the setting of chronic PPI therapy on Protonix once daily, and known 8 cm hiatal hernia with previous history of Cameron erosions. She has also had prior history of iron deficiency anemia and heme positive stool for which she has undergone full workup as outlined above last done in 2021 with EGD/colonoscopy and capsule endoscopy which did show 1 small nonbleeding AVM about 25 minutes beyond the first duodenal image.  Etiology of current episode of upper GI bleeding is not clear, rule out Cameron erosions/ulcerations with bleed, rule out peptic ulcer disease, rule out AVM  #2 anemia acute secondary to acute GI bleeding macrocytic-rule out component of B12/folate deficiency, denies EtOH  #3 history of hypertension #4.  Osteoarthritis #5.  Cholelithiasis documented on CT  Plan; clear liquids tonight, n.p.o. after midnight IV PPI infusion Zofran every 6 hours as needed for nausea Patient has been scheduled for EGD, hopefully tomorrow a.m.  Procedure has been discussed in detail with the patient including indications risk and benefits and she is agreeable to proceed. Every 6 hour hemoglobins and  would transfuse for any drop in hemoglobin overnight, and plan to keep hemoglobin above 7.5  Will check anemia panel/iron studies B12 folate prior to any transfusions Other recommendations pending results of above. GI will follow with you   Amy Esterwood PA-C  12/15/2022, 3:56 PM  GI ATTENDING  History, laboratories, x-rays, multiple prior endoscopy reports reviewed.  Patient personally seen and examined in the emergency room.  Agree with comprehensive consultation note as outlined above.  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  Presents now with presents with progressive fatigue and obvious upper GI bleeding with coffee-ground emesis and melena.  Hemoglobin down 6 g from January of this year.  Hemodynamically stable.  In addition to Cameron erosions, in the past, she did have small nonbleeding AVM on capsule study.  She does stay on chronic iron as well as chronic PPI therapies.  No NSAIDs.  Agree with recommendations as outlined above.  Plans tomorrow for upper endoscopy with possible conversion to small bowel enteroscopy.The nature of the procedure, as well as the risks, benefits, and alternatives were carefully and thoroughly reviewed with the patient. Ample time for discussion and questions allowed. The patient understood, was satisfied, and agreed to proceed.  Dr. Danis to perform.  Patient aware.   N. , Jr., M.D. [facility] of Gastroenterology    

## 2022-12-16 ENCOUNTER — Inpatient Hospital Stay (HOSPITAL_COMMUNITY): Payer: 59 | Admitting: Registered Nurse

## 2022-12-16 ENCOUNTER — Encounter (HOSPITAL_COMMUNITY): Payer: Self-pay | Admitting: Internal Medicine

## 2022-12-16 ENCOUNTER — Encounter (HOSPITAL_COMMUNITY)
Admission: EM | Disposition: A | Discharge status: Home / Self Care | Payer: Self-pay | Source: Home / Self Care | Attending: Emergency Medicine

## 2022-12-16 DIAGNOSIS — K449 Diaphragmatic hernia without obstruction or gangrene: Secondary | ICD-10-CM

## 2022-12-16 DIAGNOSIS — D62 Acute posthemorrhagic anemia: Secondary | ICD-10-CM

## 2022-12-16 DIAGNOSIS — K920 Hematemesis: Secondary | ICD-10-CM | POA: Diagnosis not present

## 2022-12-16 DIAGNOSIS — K922 Gastrointestinal hemorrhage, unspecified: Secondary | ICD-10-CM | POA: Diagnosis not present

## 2022-12-16 DIAGNOSIS — K259 Gastric ulcer, unspecified as acute or chronic, without hemorrhage or perforation: Secondary | ICD-10-CM | POA: Diagnosis not present

## 2022-12-16 DIAGNOSIS — K440 Diaphragmatic hernia with obstruction, without gangrene: Secondary | ICD-10-CM

## 2022-12-16 HISTORY — PX: ESOPHAGOGASTRODUODENOSCOPY (EGD) WITH PROPOFOL: SHX5813

## 2022-12-16 LAB — CBC
HCT: 28.7 % — ABNORMAL LOW (ref 36.0–46.0)
Hemoglobin: 8.8 g/dL — ABNORMAL LOW (ref 12.0–15.0)
MCH: 29.6 pg (ref 26.0–34.0)
MCHC: 30.7 g/dL (ref 30.0–36.0)
MCV: 96.6 fL (ref 80.0–100.0)
Platelets: 175 10*3/uL (ref 150–400)
RBC: 2.97 MIL/uL — ABNORMAL LOW (ref 3.87–5.11)
RDW: 16.8 % — ABNORMAL HIGH (ref 11.5–15.5)
WBC: 5.5 10*3/uL (ref 4.0–10.5)
nRBC: 0 % (ref 0.0–0.2)

## 2022-12-16 LAB — COMPREHENSIVE METABOLIC PANEL
ALT: 11 U/L (ref 0–44)
AST: 13 U/L — ABNORMAL LOW (ref 15–41)
Albumin: 2.9 g/dL — ABNORMAL LOW (ref 3.5–5.0)
Alkaline Phosphatase: 52 U/L (ref 38–126)
Anion gap: 5 (ref 5–15)
BUN: 23 mg/dL (ref 8–23)
CO2: 20 mmol/L — ABNORMAL LOW (ref 22–32)
Calcium: 7.7 mg/dL — ABNORMAL LOW (ref 8.9–10.3)
Chloride: 114 mmol/L — ABNORMAL HIGH (ref 98–111)
Creatinine, Ser: 0.77 mg/dL (ref 0.44–1.00)
GFR, Estimated: >60 mL/min (ref >60)
Glucose, Bld: 100 mg/dL — ABNORMAL HIGH (ref 70–99)
Potassium: 3.4 mmol/L — ABNORMAL LOW (ref 3.5–5.1)
Sodium: 139 mmol/L (ref 135–145)
Total Bilirubin: 0.7 mg/dL (ref 0.3–1.2)
Total Protein: 5.2 g/dL — ABNORMAL LOW (ref 6.5–8.1)

## 2022-12-16 LAB — BPAM RBC
ISSUE DATE / TIME: 202405140054
Unit Type and Rh: 5100
Unit Type and Rh: 5100

## 2022-12-16 LAB — TYPE AND SCREEN: Unit division: 0

## 2022-12-16 LAB — PREPARE RBC (CROSSMATCH)

## 2022-12-16 SURGERY — ESOPHAGOGASTRODUODENOSCOPY (EGD) WITH PROPOFOL
Anesthesia: Monitor Anesthesia Care

## 2022-12-16 MED ORDER — DEXTROSE-NACL 5-0.45 % IV SOLN: INTRAVENOUS | Status: DC

## 2022-12-16 MED ORDER — PANTOPRAZOLE SODIUM 40 MG PO TBEC: 40.0000 mg | DELAYED_RELEASE_TABLET | Freq: Two times a day (BID) | ORAL | 0 refills | Status: DC

## 2022-12-16 MED ORDER — METOPROLOL TARTRATE 5 MG/5ML IV SOLN: 5.0000 mg | INTRAVENOUS | Status: DC | PRN

## 2022-12-16 MED ORDER — HYDRALAZINE HCL 20 MG/ML IJ SOLN: 10.0000 mg | INTRAMUSCULAR | Status: DC | PRN

## 2022-12-16 MED ORDER — PROPOFOL 500 MG/50ML IV EMUL
INTRAVENOUS | Status: DC | PRN
  Administered 2022-12-16: 125 ug/kg/min via INTRAVENOUS

## 2022-12-16 MED ORDER — LACTATED RINGERS IV SOLN: INTRAVENOUS | Status: DC | PRN

## 2022-12-16 MED ORDER — ACETAMINOPHEN 325 MG PO TABS: 650.0000 mg | ORAL_TABLET | Freq: Four times a day (QID) | ORAL | Status: DC | PRN

## 2022-12-16 MED ORDER — SODIUM CHLORIDE 0.9 % IV SOLN
510.0000 mg | Freq: Once | INTRAVENOUS | Status: AC
  Administered 2022-12-16: 510 mg via INTRAVENOUS
  Filled 2022-12-16: qty 17

## 2022-12-16 MED ORDER — SODIUM CHLORIDE 0.9 % IV BOLUS
1000.0000 mL | Freq: Once | INTRAVENOUS | Status: AC
  Administered 2022-12-16: 1000 mL via INTRAVENOUS

## 2022-12-16 MED ORDER — PANTOPRAZOLE SODIUM 40 MG PO TBEC: 40.0000 mg | DELAYED_RELEASE_TABLET | Freq: Two times a day (BID) | ORAL | Status: DC

## 2022-12-16 MED ORDER — SENNOSIDES-DOCUSATE SODIUM 8.6-50 MG PO TABS: 1.0000 | ORAL_TABLET | Freq: Every evening | ORAL | 0 refills | Status: AC | PRN

## 2022-12-16 MED ORDER — SENNOSIDES-DOCUSATE SODIUM 8.6-50 MG PO TABS: 1.0000 | ORAL_TABLET | Freq: Every evening | ORAL | Status: DC | PRN

## 2022-12-16 MED ORDER — LIDOCAINE 2% (20 MG/ML) 5 ML SYRINGE
INTRAMUSCULAR | Status: DC | PRN
  Administered 2022-12-16: 100 mg via INTRAVENOUS

## 2022-12-16 MED ORDER — POTASSIUM CHLORIDE 10 MEQ/100ML IV SOLN
10.0000 meq | INTRAVENOUS | Status: AC
  Administered 2022-12-16 (×3): 10 meq via INTRAVENOUS
  Filled 2022-12-16 (×4): qty 100

## 2022-12-16 MED ORDER — GUAIFENESIN 100 MG/5ML PO LIQD: 5.0000 mL | ORAL | Status: DC | PRN

## 2022-12-16 MED ORDER — PROPOFOL 10 MG/ML IV BOLUS
INTRAVENOUS | Status: DC | PRN
  Administered 2022-12-16 (×2): 20 mg via INTRAVENOUS

## 2022-12-16 MED ORDER — IPRATROPIUM-ALBUTEROL 0.5-2.5 (3) MG/3ML IN SOLN: 3.0000 mL | RESPIRATORY_TRACT | Status: DC | PRN

## 2022-12-16 MED ORDER — TRAZODONE HCL 50 MG PO TABS: 50.0000 mg | ORAL_TABLET | Freq: Every evening | ORAL | Status: DC | PRN

## 2022-12-16 SURGICAL SUPPLY — 15 items

## 2022-12-16 NOTE — Transfer of Care (Signed)
Immediate Anesthesia Transfer of Care Note  Patient: [name] [name]  Procedure(s) Performed: ENTEROSCOPY  Patient Location: PACU  Anesthesia Type:MAC  Level of Consciousness: awake and alert   Airway & Oxygen Therapy: Patient Spontanous Breathing and Patient connected to face mask oxygen  Post-op Assessment: Report given to RN and Post -op Vital signs reviewed and stable  Post vital signs: Reviewed and stable  Last Vitals:  Vitals Value Taken Time  BP    Temp    Pulse    Resp    SpO2      Last Pain:  Vitals:   12/16/22 1146  TempSrc: Temporal  PainSc: 0-No pain         Complications: No notable events documented.

## 2022-12-16 NOTE — Progress Notes (Signed)
Patient ID: [name] [name], female   DOB: 12-10-1960, 62 y.o.   MRN: [id number]    Progress Note   Subjective   Day # 2 CC; acute upper GI bleed with coffee-ground emesis and melena, acute blood loss anemia  Hemoglobin dropped from 7.7 on arrival to 6.7 last p.m., transfused 2 units and up to 8.8 this morning Potassium 3.4/BUN 23/creatinine 0.77 LFTs normal Ferritin 19/iron 25/TIBC 356/iron sat 7 B12 461  Patient says she feels okay, was able to get up to the bathroom without dizziness or lightheadedness she has not had any further vomiting or coffee-ground emesis and no further melena overnight or this morning no complaints of abdominal pain    Objective   Vital signs in last 24 hours: Temp:  [98.1 F (36.7 C)-100 F (37.8 C)] 98.5 F (36.9 C) (05/14 0642) Pulse Rate:  [56-101] 60 (05/14 0642) Resp:  [15-20] 19 (05/14 0642) BP: (91-127)/(41-113) 91/48 (05/14 0642) SpO2:  [97 %-100 %] 99 % (05/14 0642) Weight:  [79.6 kg-83.5 kg] 79.6 kg (05/13 1824)   General: Older white female in NAD Heart:  Regular rate and rhythm; no murmurs Lungs: Respirations even and unlabored, lungs CTA bilaterally Abdomen:  Soft, nontender and nondistended. Normal bowel sounds. Extremities:  Without edema. Neurologic:  Alert and oriented,  grossly normal neurologically. Psych:  Cooperative. Normal mood and affect.  Intake/Output from previous day: 05/13 0701 - 05/14 0700 In: 1027.3 [I.V.:143.8; Blood:683.5; IV Piggyback:200] Out: -  Intake/Output this shift: No intake/output data recorded.  Lab Results: Recent Labs    12/15/22 1336 12/15/22 1900 12/15/22 2154 12/16/22 0501  WBC 8.3  --   --  5.5  HGB 7.7* 7.2* 6.7* 8.8*  HCT 26.3* 24.5* 22.9* 28.7*  PLT 211  --   --  175   BMET Recent Labs    12/15/22 1336 12/16/22 0501  NA 137 139  K 4.4 3.4*  CL 111 114*  CO2 19* 20*  GLUCOSE 109* 100*  BUN 45* 23  CREATININE 0.73 0.77  CALCIUM 8.4* 7.7*   LFT Recent Labs     12/16/22 0501  PROT 5.2*  ALBUMIN 2.9*  AST 13*  ALT 11  ALKPHOS 52  BILITOT 0.7   PT/INR No results for input(s): "LABPROT", "INR" in the last 72 hours.  Studies/Results: DG Chest Port 1 View  Result Date: 12/15/2022 CLINICAL DATA:  Fever. EXAM: PORTABLE CHEST 1 VIEW COMPARISON:  Chest radiograph 06/15/2022. FINDINGS: Clear lungs. Stable cardiac and mediastinal contours with moderate hiatal hernia. No pleural effusion or pneumothorax. Visualized bones and upper abdomen are unremarkable. IMPRESSION: 1.  No evidence of acute cardiopulmonary disease. 2. Moderate hiatal hernia. Electronically Signed   By: Orvan Falconer M.D.   On: 12/15/2022 20:43       Assessment / Plan:    #46 62 year old female with acute upper GI bleed onset 12/15/2022 with dizziness, lightheadedness and then large-volume coffee-ground emesis followed by large melenic stool x 1. On arrival to the ER hemoglobin was 7.7 down from her baseline of 13.8 in January  HGB drifted below 7 last evening and she was transfused 2 units of packed RBCs and is up to 8.4 this morning. She has remained hemodynamically stable, has not had any further coffee-ground emesis or melena.  Etiology of the acute upper GI bleed is not clear, suspect related to her 8 cm hiatal hernia, Cameron ulceration with bleed, peptic ulcer disease, AVM, Mallory-Weiss tear  #2 iron deficiency anemia this has been an intermittent  chronic problem over the past few years-she had been on oral iron 325 mg once daily at home Did receive Feraheme last p.m.  She did have 1 small AVM documented at capsule endoscopy in 2021 which was about 25 minutes distal to the first duodenal image, has not had any other documented AVMs.  #2 restless leg syndrome #3.  Migraine headaches #4.  Osteoarthritis #5 cholelithiasis documented on previous CT  Plan; Keep n.p.o. this morning And is scheduled for EGD/enteroscopy with Dr. Myrtie Neither today.  Procedure has been discussed in  detail with the patient including indications risk and benefits and she is agreeable to proceed. Continue PPI IV twice daily Continue to trend hemoglobin and transfuse to keep hemoglobin closer to 8 Further recommendations pending findings at endoscopic evaluation today    Principal Problem:   Upper GI bleed     LOS: 1 day     PA-C 12/16/2022, 9:43 AM

## 2022-12-16 NOTE — Anesthesia Preprocedure Evaluation (Addendum)
Anesthesia Evaluation  Patient identified by MRN, date of birth, ID band Patient awake    Reviewed: Allergy & Precautions, NPO status , Patient's Chart, lab work & pertinent test results  History of Anesthesia Complications Negative for: history of anesthetic complications  Airway Mallampati: II  TM Distance: >3 FB Neck ROM: Full    Dental  (+) Dental Advisory Given, Edentulous Upper, Partial Lower   Pulmonary asthma    Pulmonary exam normal        Cardiovascular Normal cardiovascular exam+ Valvular Problems/Murmurs MR    Moderate MR on echo in 2014    Neuro/Psych  Headaches  RLS   negative psych ROS   GI/Hepatic Neg liver ROS, PUD,GERD  Medicated and Controlled,,  Endo/Other   Obesity   Renal/GU negative Renal ROS     Musculoskeletal  (+) Arthritis , Osteoarthritis,    Abdominal   Peds  Hematology  (+) Blood dyscrasia, anemia   Anesthesia Other Findings   Reproductive/Obstetrics                             Anesthesia Physical Anesthesia Plan  ASA: 3  Anesthesia Plan: MAC   Post-op Pain Management: Minimal or no pain anticipated   Induction:   PONV Risk Score and Plan: 2 and Propofol infusion and Treatment may vary due to age or medical condition  Airway Management Planned: Nasal Cannula and Natural Airway  Additional Equipment: None  Intra-op Plan:   Post-operative Plan:   Informed Consent: I have reviewed the patients History and Physical, chart, labs and discussed the procedure including the risks, benefits and alternatives for the proposed anesthesia with the patient or authorized representative who has indicated his/her understanding and acceptance.       Plan Discussed with: CRNA and Anesthesiologist  Anesthesia Plan Comments:        Anesthesia Quick Evaluation

## 2022-12-16 NOTE — Care Management Obs Status (Signed)
MEDICARE OBSERVATION STATUS NOTIFICATION   Patient Details  Name: [name] [name] MRN: [id number] Date of Birth: Dec 24, 1960   Medicare Observation Status Notification Given:  Hart Robinsons, LCSW 12/16/2022, 4:28 PM

## 2022-12-16 NOTE — Progress Notes (Addendum)
Pt received IV Fe+ & KCL x 3 riders total. Per Dr. Nelson Chimes, may hold 4th administration & discharge pt. Discharge instructions provided. Pt verbalized understanding of all information. Will follow up as instructed. IV caths x 2 removed, TELE called & informed, & heart monitor removed. Pt was transferred via wheel chair for transportation home, asymptomatic of any s/s of distress.

## 2022-12-16 NOTE — Care Management CC44 (Signed)
Condition Code 44 Documentation Completed  Patient Details  Name: [name] [name] MRN: [id number] Date of Birth: 1960/10/19   Condition Code 44 given:  Yes Patient signature on Condition Code 44 notice:  Yes Documentation of 2 MD's agreement:  Yes Code 44 added to claim:  Yes    , , LCSW 12/16/2022, 4:28 PM

## 2022-12-16 NOTE — Progress Notes (Signed)
Pt only received 2 bags of K+ riders (20 mEq total). Prior to ENDO. Secure chat with Dr. Nelson Chimes. To administer one more & then ok to discharge without bag # 4.

## 2022-12-16 NOTE — Discharge Summary (Signed)
Physician Discharge Summary  [name] [name] UJW:[id number] DOB: 15-Aug-1960 DOA: 12/15/2022  PCP: Rebekah Chesterfield, NP  Admit date: 12/15/2022 Discharge date: 12/16/2022  Admitted From: Home Disposition:  Home  Recommendations for Outpatient Follow-up:  Follow up with PCP in 1-2 weeks Please obtain BMP/CBC in one week your next doctors visit.  PPI BID AC, follow up with outpatient [facility] which will be arranged by their service.   Discharge Condition: Stable CODE STATUS: Full code Diet recommendation: Regular  Brief/Interim Summary:  62 year old with history of ulcerative colitis, GI bleed in 2021, small AVMs, hiatal hernia with Sheria Lang erosions admitted to the hospital for symptomatic anemia with complaints of hematemesis and melena.  Upon admission [facility] consulted.  Admission hemoglobin 7.7, baseline around 13.0. Endoscopy showed large hiatal hernia and nonbleeding gastric ulcer.  Cleared by gastroenterology for discharge on oral iron, PPI twice daily and outpatient follow-up.     Assessment & Plan:  Principal Problem:   Upper GI bleed      Acute GI bleed, nonbleeding Oestreich ulcer Symptomatic anemia with iron deficiency Hiatal hernia -Baseline hemoglobin 13.0, admission hemoglobin 7.7 which drifted down to 6.7 requiring PRBC transfusion.  Status post endoscopy showing large hiatal hernia, nonbleeding gastric ulcer.  Will start on PPI twice daily.  Follow-up outpatient GI, their service will refer to surgery   Low blood pressure - Resolved with liquids   Low grade fever Remains afebrile, no evidence of infection   Hx of Chronic Colitis -s/p  treatment in remission    Hyperlipidemia - Statin on hold   Peripheral neuropathy - Gabapentin      Consultations: [facility]  Subjective: Feels great no complaints.  Wants to go home  Discharge Exam: Vitals:   12/16/22 1348 12/16/22 1401  BP: (!) 102/51 (!) 106/59  Pulse: 65 64  Resp: 20 20   Temp:  98.3 F (36.8 C)  SpO2: 100% 100%   Vitals:   12/16/22 1335 12/16/22 1340 12/16/22 1348 12/16/22 1401  BP: (!) 104/53 (!) 105/44 (!) 102/51 (!) 106/59  Pulse: 69 65 65 64  Resp: 20 (!) 21 20 20   Temp:    98.3 F (36.8 C)  TempSrc:    Oral  SpO2: 96% 100% 100% 100%  Weight:      Height:        General: Pt is alert, awake, not in acute distress Cardiovascular: RRR, S1/S2 +, no rubs, no gallops Respiratory: CTA bilaterally, no wheezing, no rhonchi Abdominal: Soft, NT, ND, bowel sounds + Extremities: no edema, no cyanosis  Discharge Instructions   Allergies as of 12/16/2022       Reactions   Acetaminophen Other (See Comments)   Made the patient "very sleepy"   Penicillins Hives, Itching, Other (See Comments)   FEB 2018 - broke out in hives Has patient had a PCN reaction causing immediate rash, facial/tongue/throat swelling, SOB or lightheadedness with hypotension: Yes Has patient had a PCN reaction causing severe rash involving mucus membranes or skin necrosis: No Has patient had a PCN reaction that required hospitalization: No Has patient had a PCN reaction occurring within the last 10 years: Yes If all of the above answers are "NO", then may proceed with Cephalosporin use.   Topamax [topiramate] Other (See Comments)   Kidney stones        Medication List     TAKE these medications    cetirizine 10 MG tablet Commonly known as: ZYRTEC Take 10 mg by mouth daily as needed  for allergies or rhinitis.   ferrous sulfate 325 (65 FE) MG tablet Take 325 mg by mouth daily with breakfast.   Flonase Allergy Relief 50 MCG/ACT nasal spray Generic drug: fluticasone Place 1 spray into both nostrils 2 (two) times daily.   furosemide 40 MG tablet Commonly known as: LASIX Take 40 mg by mouth daily as needed (for swelling).   gabapentin 400 MG capsule Commonly known as: NEURONTIN Take 400-800 mg by mouth at bedtime as needed (for restless legs).   pantoprazole 40  MG tablet Commonly known as: PROTONIX Take 1 tablet (40 mg total) by mouth 2 (two) times daily before a meal. What changed: when to take this   Potassium 99 MG Tabs Take 99 mg by mouth daily with breakfast.   QC Tumeric Complex 500 MG Caps Generic drug: Turmeric Take 500 mg by mouth daily.   rosuvastatin 10 MG tablet Commonly known as: CRESTOR Take 10 mg by mouth at bedtime.   senna-docusate 8.6-50 MG tablet Commonly known as: Senokot-S Take 1 tablet by mouth at bedtime as needed for moderate constipation.   traZODone 100 MG tablet Commonly known as: DESYREL Take 100 mg by mouth at bedtime as needed for sleep.   Xiidra 5 % Soln Generic drug: Lifitegrast Place 1 drop into both eyes 2 (two) times daily.        Follow-up Information     Esterwood, Amy S, PA-C. Go on 12/24/2022.   Specialty: Gastroenterology Why: office appt with Mike Gip PA-C on 12/24/2022 - 3rd floor [facility]   [address]  11 am Contact information: [address] [place] [address] [phone]                Allergies  Allergen Reactions   Acetaminophen Other (See Comments)    Made the patient "very sleepy"   Penicillins Hives, Itching and Other (See Comments)    FEB 2018 - broke out in hives Has patient had a PCN reaction causing immediate rash, facial/tongue/throat swelling, SOB or lightheadedness with hypotension: Yes Has patient had a PCN reaction causing severe rash involving mucus membranes or skin necrosis: No Has patient had a PCN reaction that required hospitalization: No Has patient had a PCN reaction occurring within the last 10 years: Yes If all of the above answers are "NO", then may proceed with Cephalosporin use.    Topamax [Topiramate] Other (See Comments)    Kidney stones    You were cared for by a hospitalist during your hospital stay. If you have any questions about your discharge medications or the care you received while you were in the hospital  after you are discharged, you can call the unit and asked to speak with the hospitalist on call if the hospitalist that took care of you is not available. Once you are discharged, your primary care physician will handle any further medical issues. Please note that no refills for any discharge medications will be authorized once you are discharged, as it is imperative that you return to your primary care physician (or establish a relationship with a primary care physician if you do not have one) for your aftercare needs so that they can reassess your need for medications and monitor your lab values.  You were cared for by a hospitalist during your hospital stay. If you have any questions about your discharge medications or the care you received while you were in the hospital after you are discharged, you can call the unit and  asked to speak with the hospitalist on call if the hospitalist that took care of you is not available. Once you are discharged, your primary care physician will handle any further medical issues. Please note that NO REFILLS for any discharge medications will be authorized once you are discharged, as it is imperative that you return to your primary care physician (or establish a relationship with a primary care physician if you do not have one) for your aftercare needs so that they can reassess your need for medications and monitor your lab values.  Please request your Prim.MD to go over all Hospital Tests and Procedure/Radiological results at the follow up, please get all Hospital records sent to your Prim MD by signing hospital release before you go home.  Get CBC, CMP, 2 view Chest X ray checked  by Primary MD during your next visit or SNF MD in 5-7 days ( we routinely change or add medications that can affect your baseline labs and fluid status, therefore we recommend that you get the mentioned basic workup next visit with your PCP, your PCP may decide not to get them or add new tests  based on their clinical decision)  On your next visit with your primary care physician please Get Medicines reviewed and adjusted.  If you experience worsening of your admission symptoms, develop shortness of breath, life threatening emergency, suicidal or homicidal thoughts you must seek medical attention immediately by calling 911 or calling your MD immediately  if symptoms less severe.  You Must read complete instructions/literature along with all the possible adverse reactions/side effects for all the Medicines you take and that have been prescribed to you. Take any new Medicines after you have completely understood and accpet all the possible adverse reactions/side effects.   Do not drive, operate heavy machinery, perform activities at heights, swimming or participation in water activities or provide baby sitting services if your were admitted for syncope or siezures until you have seen by Primary MD or a Neurologist and advised to do so again.  Do not drive when taking Pain medications.   Procedures/Studies: DG Chest Port 1 View  Result Date: 12/15/2022 CLINICAL DATA:  Fever. EXAM: PORTABLE CHEST 1 VIEW COMPARISON:  Chest radiograph 06/15/2022. FINDINGS: Clear lungs. Stable cardiac and mediastinal contours with moderate hiatal hernia. No pleural effusion or pneumothorax. Visualized bones and upper abdomen are unremarkable. IMPRESSION: 1.  No evidence of acute cardiopulmonary disease. 2. Moderate hiatal hernia. Electronically Signed   By: Orvan Falconer M.D.   On: 12/15/2022 20:43     The results of significant diagnostics from this hospitalization (including imaging, microbiology, ancillary and laboratory) are listed below for reference.     Microbiology: No results found for this or any previous visit (from the past 240 hour(s)).   Labs: BNP (last 3 results) No results for input(s): "BNP" in the last 8760 hours. Basic Metabolic Panel: Recent Labs  Lab 12/15/22 1336  12/16/22 0501  NA 137 139  K 4.4 3.4*  CL 111 114*  CO2 19* 20*  GLUCOSE 109* 100*  BUN 45* 23  CREATININE 0.73 0.77  CALCIUM 8.4* 7.7*   Liver Function Tests: Recent Labs  Lab 12/15/22 1336 12/16/22 0501  AST 18 13*  ALT 14 11  ALKPHOS 69 52  BILITOT 0.5 0.7  PROT 6.5 5.2*  ALBUMIN 3.6 2.9*   Recent Labs  Lab 12/15/22 1336  LIPASE 31   No results for input(s): "AMMONIA" in the last 168 hours. CBC: Recent Labs  Lab 12/15/22 1336 12/15/22 1900 12/15/22 2154 12/16/22 0501  WBC 8.3  --   --  5.5  NEUTROABS 7.2  --   --   --   HGB 7.7* 7.2* 6.7* 8.8*  HCT 26.3* 24.5* 22.9* 28.7*  MCV 104.8*  --   --  96.6  PLT 211  --   --  175   Cardiac Enzymes: No results for input(s): "CKTOTAL", "CKMB", "CKMBINDEX", "TROPONINI" in the last 168 hours. BNP: Invalid input(s): "POCBNP" CBG: No results for input(s): "GLUCAP" in the last 168 hours. D-Dimer No results for input(s): "DDIMER" in the last 72 hours. Hgb A1c Recent Labs    12/15/22 1900  HGBA1C 3.7*   Lipid Profile No results for input(s): "CHOL", "HDL", "LDLCALC", "TRIG", "CHOLHDL", "LDLDIRECT" in the last 72 hours. Thyroid function studies Recent Labs    12/15/22 1900  TSH 0.109*   Anemia work up Recent Labs    12/15/22 1900  VITAMINB12 461  FOLATE 23.8  FERRITIN 19  TIBC 356  IRON 25*  RETICCTPCT 5.8*   Urinalysis    Component Value Date/Time   COLORURINE YELLOW 05/12/2022 1335   APPEARANCEUR CLEAR 05/12/2022 1335   LABSPEC 1.021 05/12/2022 1335   PHURINE 7.5 05/12/2022 1335   GLUCOSEU NEGATIVE 05/12/2022 1335   HGBUR NEGATIVE 05/12/2022 1335   BILIRUBINUR NEGATIVE 05/12/2022 1335   KETONESUR NEGATIVE 05/12/2022 1335   PROTEINUR TRACE (A) 05/12/2022 1335   UROBILINOGEN 0.2 11/15/2013 1007   NITRITE NEGATIVE 05/12/2022 1335   LEUKOCYTESUR NEGATIVE 05/12/2022 1335   Sepsis Labs Recent Labs  Lab 12/15/22 1336 12/16/22 0501  WBC 8.3 5.5   Microbiology No results found for this or  any previous visit (from the past 240 hour(s)).   Time coordinating discharge:  I have spent 35 minutes face to face with the patient and on the ward discussing the patients care, assessment, plan and disposition with other care givers. >50% of the time was devoted counseling the patient about the risks and benefits of treatment/Discharge disposition and coordinating care.   SIGNED:   Dimple Nanas, MD  [facility] 12/16/2022, 2:47 PM   If 7PM-7AM, please contact night-coverage

## 2022-12-16 NOTE — Anesthesia Procedure Notes (Signed)
Date/Time: 12/16/2022 1:08 PM  Performed by: Florene Route, CRNAOxygen Delivery Method: Simple face mask

## 2022-12-16 NOTE — Op Note (Signed)
[facility] Patient Name: [name] [name] Procedure Date: 12/16/2022 MRN: [id number] Attending MD: Starr Lake. Myrtie Neither , MD, [id number] Date of Birth: April 20, 1961 CSN: [id number] Age: 62 Admit Type: Inpatient Procedure:                Upper GI endoscopy Indications:              Acute post hemorrhagic anemia, Iron deficiency                            anemia secondary to chronic blood loss, Hematemesis Providers:                Sherilyn Cooter L. Myrtie Neither, MD, Janae Sauce. Steele Berg, RN, Priscella Mann, Technician Referring MD:             [facility] Medicines:                Monitored Anesthesia Care Complications:            No immediate complications. Estimated Blood Loss:     Estimated blood loss: none. Procedure:                Pre-Anesthesia Assessment:                           - Prior to the procedure, a History and Physical                            was performed, and patient medications and                            allergies were reviewed. The patient's tolerance of                            previous anesthesia was also reviewed. The risks                            and benefits of the procedure and the sedation                            options and risks were discussed with the patient.                            All questions were answered, and informed consent                            was obtained. Prior Anticoagulants: The patient has                            taken no anticoagulant or antiplatelet agents. ASA                            Grade Assessment: II - A patient with mild systemic  disease. After reviewing the risks and benefits,                            the patient was deemed in satisfactory condition to                            undergo the procedure.                           After obtaining informed consent, the endoscope was                            passed under direct vision. Throughout the                             procedure, the patient's blood pressure, pulse, and                            oxygen saturations were monitored continuously. The                            [id number]-H190 ([id number]) Olympus endoscope was introduced                            through the mouth, and advanced to the second part                            of duodenum. The upper GI endoscopy was                            accomplished without difficulty. The patient                            tolerated the procedure well. Scope In: Scope Out: Findings:      A large hiatal hernia was present.      Multiple non-bleeding superficial gastric ulcers with no stigmata of       bleeding were found in the gastric fundus and in the gastric body. These       were all within the hernia sac, primarily along the diaphragmatic       indentation. The majority of the stomach is herniated into the chest.       The distal known herniated stomach was also seen retrolapsing into the       hernia sac with inspiration.      The exam of the stomach was otherwise normal. There was no fresh or old       blood in the upper GI tract.      The examined duodenum was normal. Impression:               - Large hiatal hernia.                           - Non-bleeding gastric ulcers with no stigmata of  bleeding.                           - Normal examined duodenum.                           - No specimens collected. Moderate Sedation:      MAC sedation used Recommendation:           - Return patient to hospital ward for possible                            discharge same day.                           - Resume regular diet.                           - Omeprazole or pantoprazole 40 mg twice daily                           Oral iron supplement                           Discharge home today                           This patient needs an outpatient surgical                            evaluation in the near future to  plan repair of                            this large hiatal hernia. Procedure Code(s):        --- Professional ---                           [phone], Esophagogastroduodenoscopy, flexible,                            transoral; diagnostic, including collection of                            specimen(s) by brushing or washing, when performed                            (separate procedure) Diagnosis Code(s):        --- Professional ---                           K44.9, Diaphragmatic hernia without obstruction or                            gangrene                           K25.9, Gastric ulcer, unspecified as acute or  chronic, without hemorrhage or perforation                           D62, Acute posthemorrhagic anemia                           D50.0, Iron deficiency anemia secondary to blood                            loss (chronic)                           K92.0, Hematemesis CPT copyright 2022 American Medical Association. All rights reserved. The codes documented in this report are preliminary and upon coder review may  be revised to meet current compliance requirements.  L. Myrtie Neither, MD 12/16/2022 1:23:55 PM This report has been signed electronically. Number of Addenda: 0

## 2022-12-16 NOTE — Interval H&P Note (Signed)
History and Physical Interval Note:  12/16/2022 12:31 PM  [name] [name]  has presented today for surgery, with the diagnosis of Upper GI bleed.  The various methods of treatment have been discussed with the patient and family. After consideration of risks, benefits and other options for treatment, the patient has consented to  Procedure(s): ENTEROSCOPY (N/A) as a surgical intervention.  The patient's history has been reviewed, patient examined, no change in status, stable for surgery.  I have reviewed the patient's chart and labs.  Questions were answered to the patient's satisfaction.    Rec'd 2 units PRBCs overnight.  No hematemesis since admission. Patient appears well and abdominal exam benign in pre-procedure area.  Charlie Pitter III

## 2022-12-16 NOTE — Progress Notes (Signed)
PROGRESS NOTE    [name] [name]  BJY:[id number] DOB: 1960/10/04 DOA: 12/15/2022 PCP: Rebekah Chesterfield, NP   Brief Narrative:  62 year old with history of ulcerative colitis, GI bleed in 2021, small AVMs, hiatal hernia with Sheria Lang erosions admitted to the hospital for symptomatic anemia with complaints of hematemesis and melena.  Upon admission [facility] consulted.  Admission hemoglobin 7.7, baseline around 13.0.   Assessment & Plan:  Principal Problem:   Upper GI bleed     Acute GI bleed Symptomatic anemia with iron deficiency Hiatal hernia -Baseline hemoglobin 13.0, admission hemoglobin 7.7 which drifted down to 6.7 requiring PRBC transfusion.  [facility] has been consulted with plans for endoscopic evaluation.  Iron study shows iron deficiency anemia/low ferritin.  Will give IV iron, resume p.o. starting tomorrow  Low blood pressure - Also low heart rate.  Will give normal saline bolus   Low grade fever Chest x-ray is unremarkable.  UA is currently pending.  Hold off on antibiotics    Hx of Chronic Colitis -s/p  treatment in remission   Hyperlipidemia - Statin on hold  Peripheral neuropathy - Gabapentin on hold    DVT prophylaxis: SCDs Start: 12/15/22 1830 Code Status: Full code Family Communication:   Status is: Inpatient On going GI Bleed eval.        Diet Orders (From admission, onward)     Start     Ordered   12/16/22 0001  Diet NPO time specified Except for: Sips with Meds  Diet effective midnight       Question:  Except for  Answer:  Clearance Coots with Meds   12/15/22 1817            Subjective: Seen at bedside no complaints   Examination:  General exam: Appears calm and comfortable  Respiratory system: Clear to auscultation. Respiratory effort normal. Cardiovascular system: S1 & S2 heard, RRR. No JVD, murmurs, rubs, gallops or clicks. No pedal edema. Gastrointestinal system: Abdomen is nondistended, soft and nontender. No organomegaly or masses  felt. Normal bowel sounds heard. Central nervous system: Alert and oriented. No focal neurological deficits. Extremities: Symmetric 5 x 5 power. Skin: No rashes, lesions or ulcers Psychiatry: Judgement and insight appear normal. Mood & affect appropriate.  Objective: Vitals:   12/16/22 0110 12/16/22 0115 12/16/22 0333 12/16/22 0642  BP:  (!) 97/55 (!) 98/41 (!) 91/48  Pulse:  (!) 58 (!) 56 60  Resp:  19 17 19   Temp:  98.5 F (36.9 C) 98.2 F (36.8 C) 98.5 F (36.9 C)  TempSrc:  Oral Oral Oral  SpO2: 98% 98% 97% 99%  Weight:      Height:        Intake/Output Summary (Last 24 hours) at 12/16/2022 0903 Last data filed at 12/16/2022 0700 Gross per 24 hour  Intake 1027.34 ml  Output --  Net 1027.34 ml   Filed Weights   12/15/22 1233 12/15/22 1824  Weight: 83.5 kg 79.6 kg    Scheduled Meds:  sodium chloride   Intravenous Once   ferrous sulfate  325 mg Oral Q breakfast   ondansetron (ZOFRAN) IV  4 mg Intravenous Once   Continuous Infusions:  sodium chloride Stopped (12/15/22 2036)   dextrose 5 % and 0.45% NaCl     pantoprazole (PROTONIX) IV Stopped (12/15/22 2221)    Nutritional status     Body mass index is 29.2 kg/m.  Data Reviewed:   CBC: Recent Labs  Lab 12/15/22 1336 12/15/22 1900 12/15/22 2154 12/16/22 0501  WBC 8.3  --   --  5.5  NEUTROABS 7.2  --   --   --   HGB 7.7* 7.2* 6.7* 8.8*  HCT 26.3* 24.5* 22.9* 28.7*  MCV 104.8*  --   --  96.6  PLT 211  --   --  175   Basic Metabolic Panel: Recent Labs  Lab 12/15/22 1336 12/16/22 0501  NA 137 139  K 4.4 3.4*  CL 111 114*  CO2 19* 20*  GLUCOSE 109* 100*  BUN 45* 23  CREATININE 0.73 0.77  CALCIUM 8.4* 7.7*   GFR: Estimated Creatinine Clearance: 76 mL/min (by C-G formula based on SCr of 0.77 mg/dL). Liver Function Tests: Recent Labs  Lab 12/15/22 1336 12/16/22 0501  AST 18 13*  ALT 14 11  ALKPHOS 69 52  BILITOT 0.5 0.7  PROT 6.5 5.2*  ALBUMIN 3.6 2.9*   Recent Labs  Lab  12/15/22 1336  LIPASE 31   No results for input(s): "AMMONIA" in the last 168 hours. Coagulation Profile: No results for input(s): "INR", "PROTIME" in the last 168 hours. Cardiac Enzymes: No results for input(s): "CKTOTAL", "CKMB", "CKMBINDEX", "TROPONINI" in the last 168 hours. BNP (last 3 results) No results for input(s): "PROBNP" in the last 8760 hours. HbA1C: Recent Labs    12/15/22 1900  HGBA1C 3.7*   CBG: No results for input(s): "GLUCAP" in the last 168 hours. Lipid Profile: No results for input(s): "CHOL", "HDL", "LDLCALC", "TRIG", "CHOLHDL", "LDLDIRECT" in the last 72 hours. Thyroid Function Tests: Recent Labs    12/15/22 1900  TSH 0.109*   Anemia Panel: Recent Labs    12/15/22 1900  VITAMINB12 461  FOLATE 23.8  FERRITIN 19  TIBC 356  IRON 25*  RETICCTPCT 5.8*   Sepsis Labs: Recent Labs  Lab 12/15/22 2154  LATICACIDVEN 0.9    No results found for this or any previous visit (from the past 240 hour(s)).       Radiology Studies: DG Chest Port 1 View  Result Date: 12/15/2022 CLINICAL DATA:  Fever. EXAM: PORTABLE CHEST 1 VIEW COMPARISON:  Chest radiograph 06/15/2022. FINDINGS: Clear lungs. Stable cardiac and mediastinal contours with moderate hiatal hernia. No pleural effusion or pneumothorax. Visualized bones and upper abdomen are unremarkable. IMPRESSION: 1.  No evidence of acute cardiopulmonary disease. 2. Moderate hiatal hernia. Electronically Signed   By: Orvan Falconer M.D.   On: 12/15/2022 20:43           LOS: 1 day   Time spent= 35 mins     Joline Maxcy, MD [facility]  If 7PM-7AM, please contact night-coverage  12/16/2022, 9:03 AM

## 2022-12-16 NOTE — Anesthesia Postprocedure Evaluation (Signed)
Anesthesia Post Note  Patient: [name] [name]  Procedure(s) Performed: ENTEROSCOPY     Patient location during evaluation: PACU Anesthesia Type: MAC Level of consciousness: awake and alert Pain management: pain level controlled Vital Signs Assessment: post-procedure vital signs reviewed and stable Respiratory status: spontaneous breathing, nonlabored ventilation and respiratory function stable Cardiovascular status: stable and blood pressure returned to baseline Anesthetic complications: no   No notable events documented.  Last Vitals:  Vitals:   12/16/22 1348 12/16/22 1401  BP: (!) 102/51 (!) 106/59  Pulse: 65 64  Resp: 20 20  Temp:  36.8 C  SpO2: 100% 100%    Last Pain:  Vitals:   12/16/22 1401  TempSrc: Oral  PainSc:                  Beryle Lathe

## 2022-12-17 ENCOUNTER — Ambulatory Visit: Payer: Medicare HMO | Admitting: Nurse Practitioner

## 2022-12-17 LAB — TYPE AND SCREEN
Unit division: 0
Unit division: 0

## 2022-12-17 LAB — BPAM RBC
Blood Product Expiration Date: 202406112359
Blood Product Expiration Date: 202406132359

## 2022-12-18 ENCOUNTER — Encounter (HOSPITAL_COMMUNITY): Payer: Self-pay | Admitting: Gastroenterology

## 2022-12-19 LAB — TYPE AND SCREEN
ABO/RH(D): O POS
Unit division: 0

## 2022-12-19 LAB — BPAM RBC
Blood Product Expiration Date: 202406112359
Blood Product Expiration Date: 202406132359
ISSUE DATE / TIME: 202405132234
Unit Type and Rh: 5100

## 2022-12-22 ENCOUNTER — Other Ambulatory Visit: Payer: Self-pay | Admitting: Nurse Practitioner

## 2022-12-23 ENCOUNTER — Ambulatory Visit: Payer: 59 | Admitting: Physician Assistant

## 2022-12-24 ENCOUNTER — Encounter: Payer: Self-pay | Admitting: Physician Assistant

## 2022-12-24 ENCOUNTER — Ambulatory Visit (INDEPENDENT_AMBULATORY_CARE_PROVIDER_SITE_OTHER): Payer: 59 | Admitting: Physician Assistant

## 2022-12-24 ENCOUNTER — Other Ambulatory Visit (INDEPENDENT_AMBULATORY_CARE_PROVIDER_SITE_OTHER): Payer: 59

## 2022-12-24 DIAGNOSIS — K449 Diaphragmatic hernia without obstruction or gangrene: Secondary | ICD-10-CM | POA: Diagnosis not present

## 2022-12-24 DIAGNOSIS — Z8379 Family history of other diseases of the digestive system: Secondary | ICD-10-CM

## 2022-12-24 DIAGNOSIS — K259 Gastric ulcer, unspecified as acute or chronic, without hemorrhage or perforation: Secondary | ICD-10-CM | POA: Diagnosis not present

## 2022-12-24 DIAGNOSIS — D509 Iron deficiency anemia, unspecified: Secondary | ICD-10-CM

## 2022-12-24 LAB — CBC WITH DIFFERENTIAL/PLATELET
Basophils Absolute: 0.1 10*3/uL (ref 0.0–0.1)
Basophils Relative: 1 % (ref 0.0–3.0)
Eosinophils Absolute: 0.1 10*3/uL (ref 0.0–0.7)
Eosinophils Relative: 2.8 % (ref 0.0–5.0)
HCT: 34.6 % — ABNORMAL LOW (ref 36.0–46.0)
Hemoglobin: 10.9 g/dL — ABNORMAL LOW (ref 12.0–15.0)
Lymphocytes Relative: 13.2 % (ref 12.0–46.0)
Lymphs Abs: 0.7 10*3/uL (ref 0.7–4.0)
MCHC: 31.6 g/dL (ref 30.0–36.0)
MCV: 95.6 fl (ref 78.0–100.0)
Monocytes Absolute: 0.3 10*3/uL (ref 0.1–1.0)
Monocytes Relative: 6.3 % (ref 3.0–12.0)
Neutro Abs: 4.1 10*3/uL (ref 1.4–7.7)
Neutrophils Relative %: 76.7 % (ref 43.0–77.0)
Platelets: 253 10*3/uL (ref 150.0–400.0)
RBC: 3.61 Mil/uL — ABNORMAL LOW (ref 3.87–5.11)
RDW: 17.7 % — ABNORMAL HIGH (ref 11.5–15.5)
WBC: 5.3 10*3/uL (ref 4.0–10.5)

## 2022-12-24 NOTE — Progress Notes (Signed)
Subjective:    Patient ID: [name] [name], female    DOB: 1960/08/24, 62 y.o.   MRN: [id number]  HPI [name] [name] is a pleasant 62 year old white female, established with Dr. Lavon Paganini.  She comes in today for post hospital follow-up after recent hospitalization on 12/15/2022 with melena, and had also had an episode of hematemesis of dark blackish material Main here She underwent EGD on 12/16/2022 per Dr. Janne Napoleon with finding of a large hiatal hernia and multiple nonbleeding superficial gastric ulcers with no stigmata of active bleeding in the gastric fundus and gastric body these were all within the hernia sac primarily along the diaphragmatic indentation.  The majority of the stomach is herniated into the chest and the distal known herniated stomach was also seen retrolapsing into the hernia sac with inspiration. At time of admission patient had hemoglobin of 7.2 which drifted to 6.7.  She did require transfusion and hemoglobin was up to 8.8 on 12/16/2022.  She did not have any active bleeding during hospitalization.  She did have iron studies done showing ferritin of 19/serum iron 25/TIBC 356 and iron sat of 7.  This was despite being on oral iron 325 mg once daily, and chronic PPI twice daily.  Patient says she has been feeling okay since discharge from the hospital, she has gone back to work.  She does describe some persistent fatigue.  She says her stools stay dark on iron but she has not noticed any overt melena.  No vomiting since discharge and has been able to eat though she says she is afraid to eat too much so has been having smaller meals. She is ready to have surgery to have the hernia fixed.   Review of Systems Pertinent positive and negative review of systems were noted in the above HPI section.  All other review of systems was otherwise negative.   Outpatient Encounter Medications as of 12/24/2022  Medication Sig   cetirizine (ZYRTEC) 10 MG tablet Take 10 mg by mouth daily as needed for  allergies or rhinitis.   ferrous sulfate 325 (65 FE) MG tablet Take 325 mg by mouth daily with breakfast.   FLONASE ALLERGY RELIEF 50 MCG/ACT nasal spray Place 1 spray into both nostrils 2 (two) times daily.   furosemide (LASIX) 40 MG tablet Take 40 mg by mouth daily as needed (for swelling).   gabapentin (NEURONTIN) 400 MG capsule Take 400-800 mg by mouth at bedtime as needed (for restless legs).   pantoprazole (PROTONIX) 40 MG tablet Take 1 tablet (40 mg total) by mouth 2 (two) times daily before a meal.   Potassium 99 MG TABS Take 99 mg by mouth daily with breakfast.   rosuvastatin (CRESTOR) 10 MG tablet Take 10 mg by mouth at bedtime.   traZODone (DESYREL) 100 MG tablet Take 100 mg by mouth at bedtime as needed for sleep.   Turmeric (QC TUMERIC COMPLEX) 500 MG CAPS Take 500 mg by mouth daily.   XIIDRA 5 % SOLN Place 1 drop into both eyes 2 (two) times daily.   senna-docusate (SENOKOT-S) 8.6-50 MG tablet Take 1 tablet by mouth at bedtime as needed for moderate constipation. (Patient not taking: Reported on 12/24/2022)   No facility-administered encounter medications on file as of 12/24/2022.   Allergies  Allergen Reactions   Acetaminophen Other (See Comments)    Made the patient "very sleepy"   Penicillins Hives, Itching and Other (See Comments)    FEB 2018 - broke out in hives Has patient had a  PCN reaction causing immediate rash, facial/tongue/throat swelling, SOB or lightheadedness with hypotension: Yes Has patient had a PCN reaction causing severe rash involving mucus membranes or skin necrosis: No Has patient had a PCN reaction that required hospitalization: No Has patient had a PCN reaction occurring within the last 10 years: Yes If all of the above answers are "NO", then may proceed with Cephalosporin use.    Topamax [Topiramate] Other (See Comments)    Kidney stones   Patient Active Problem List   Diagnosis Date Noted   Hiatal hernia 12/16/2022   GI bleed 12/16/2022   Upper  GI bleed 12/15/2022   Chronic erosive gastritis 06/17/2022   Occult blood in stools 06/16/2022   Melena 06/15/2022   ABLA (acute blood loss anemia) 06/15/2022   Iron deficiency anemia due to chronic blood loss    Anemia 07/09/2017   SIRS (systemic inflammatory response syndrome) (HCC) 07/09/2017   Hypokalemia 07/09/2017   Bronchitis 07/09/2017   Arthritis 07/09/2017   URI (upper respiratory infection) 07/09/2017   OA (osteoarthritis) of knee 01/26/2017   Nephrolithiasis 11/15/2013   Chest pain 10/22/2012   Migraine 10/22/2012   Numbness and tingling in hands 10/22/2012   Social History   Socioeconomic History   Marital status: Divorced    Spouse name: Not on file   Number of children: Not on file   Years of education: Not on file   Highest education level: Not on file  Occupational History   Not on file  Tobacco Use   Smoking status: Never   Smokeless tobacco: Never  Vaping Use   Vaping Use: Never used  Substance and Sexual Activity   Alcohol use: Not Currently   Drug use: No   Sexual activity: Yes    Birth control/protection: Surgical  Other Topics Concern   Not on file  Social History Narrative   Not on file   Social Determinants of Health   Financial Resource Strain: Not on file  Food Insecurity: No Food Insecurity (12/15/2022)   Hunger Vital Sign    Worried About Running Out of Food in the Last Year: Never true    Ran Out of Food in the Last Year: Never true  Transportation Needs: No Transportation Needs (12/15/2022)   PRAPARE - Administrator, Civil Service (Medical): No    Lack of Transportation (Non-Medical): No  Physical Activity: Not on file  Stress: Not on file  Social Connections: Not on file  Intimate Partner Violence: Not At Risk (12/15/2022)   Humiliation, Afraid, Rape, and Kick questionnaire    Fear of Current or Ex-Partner: No    Emotionally Abused: No    Physically Abused: No    Sexually Abused: No    Ms. Ketterman's family  history includes Colon polyps in her father; Diabetes Mellitus II in her father and mother; Hypertension in her father and mother.      Objective:    Vitals:   12/24/22 1058  BP: 122/68  Pulse: 78    Physical Exam Well-developed well-nourished older white female in no acute distress.  Accompanied by her sister h Weight, 180 BMI 29.95  HEENT; nontraumatic normocephalic, EOMI, PE R LA, sclera anicteric. Oropharynx; Neck; supple, no JVD Cardiovascular; regular rate and rhythm with S1-S2, no murmur rub or gallop Pulmonary; Clear bilaterally Abdomen; soft, obese, mildly tender in the epigastrium nondistended, no palpable mass or hepatosplenomegaly, bowel sounds are active Rectal; not done today Skin; benign exam, no jaundice rash or appreciable lesions Extremities; no  clubbing cyanosis or edema skin warm and dry Neuro/Psych; alert and oriented x4, grossly nonfocal mood and affect appropriate        Assessment & Plan:   #30 62 year old white female with history of persistent iron deficiency anemia, recent overt GI bleeding with admission on 12/15/2022 with melena and an episode of coffee-ground emesis and found to have hemoglobin of 7.2 at that time. She has a known very large hiatal hernia about 8 cm.  She underwent EGD at the time of admission with finding of a large hiatal hernia with the majority of the stomach herniated into the chest.  There were multiple nonbleeding superficial gastric ulcers with no stigmata of bleeding within the hernia sac.  Patient did require transfusion at the time of admission and discharge hemoglobin was 8.8.  She has not had any further active bleeding since discharge, stools remain dark on iron.  She has had some fatigue but has been able to return to work.  #2 migraine headaches #3.  Osteoarthritis #4.  Colon cancer screening-colonoscopy was done April 2021 which was normal and random biopsies were unremarkable.  Plan; continue Protonix 40 mg p.o.  twice daily AC Increase oral iron to 325 mg p.o. twice daily taken with food Repeat hemoglobin today Patient will be referred to [facility] surgery for hiatal hernia repair which hopefully can be accomplished within the next couple of months. She will follow-up with Dr. Lavon Paganini  or myself on an and as needed basis and we will continue to follow her hemoglobin.   Oswald Hillock PA-C 12/24/2022   Cc: Rebekah Chesterfield, NP

## 2022-12-24 NOTE — Patient Instructions (Signed)
_______________________________________________________  If your blood pressure at your visit was 140/90 or greater, please contact your primary care physician to follow up on this.  If you are age 63 or younger, your body mass index should be between 19-25. Your Body mass index is 29.95 kg/m. If this is out of the aformentioned range listed, please consider follow up with your Primary Care Provider.  ________________________________________________________  The [facility] providers would like to encourage you to use [facility] to communicate with providers for non-urgent requests or questions.  Due to long hold times on the telephone, sending your provider a message by [facility] may be a faster and more efficient way to get a response.  Please allow 48 business hours for a response.  Please remember that this is for non-urgent requests.  _______________________________________________________  Your provider has requested that you go to the basement level for lab work before leaving today. Press "B" on the elevator. The lab is located at the first door on the left as you exit the elevator.  Due to recent changes in healthcare laws, you may see the results of your imaging and laboratory studies on MyChart before your provider has had a chance to review them.  We understand that in some cases there may be results that are confusing or concerning to you. Not all laboratory results come back in the same time frame and the provider may be waiting for multiple results in order to interpret others.  Please give [place] 48 hours in order for your provider to thoroughly review all the results before contacting the office for clarification of your results.   INCREASE: Iron to 325mg  twice daily with food.  CONTINUE: Protonix 40mg  one tablet twice daily before meals.  You have been scheduled for an appointment with __________ at [facility]. Your appointment is on ______ at ______. Please arrive at _______  for registration. Make certain to bring a list of current medications, including any over the counter medications or vitamins. Also bring your co-pay if you have one as well as your insurance cards. [facility] is located at [address]. Should you need to reschedule your appointment, please contact them at [phone].  Thank you for entrusting me with your care and choosing [facility].  Amy Esterwood, PA-C

## 2022-12-25 ENCOUNTER — Telehealth: Payer: Self-pay | Admitting: Physician Assistant

## 2022-12-25 NOTE — Telephone Encounter (Signed)
Patient is calling wishing to speak with Joni Reining regarding blood results and referral to [facility]. Please advise

## 2022-12-25 NOTE — Telephone Encounter (Signed)
I have spoken to patient to advise that we did send referral over to [facility] and she should hear from their office soon. If she has not heard from them within the next week, she will let us know. In addition, advised that CBC looks stable/better than prior to her blood transfusion. She verbalizes understanding.

## 2022-12-26 MED ORDER — FERROUS SULFATE 325 (65 FE) MG PO TABS: 325.0000 mg | ORAL_TABLET | Freq: Two times a day (BID) | ORAL | Status: AC

## 2022-12-26 NOTE — Addendum Note (Signed)
Addended by: Lamona Curl on: 12/26/2022 10:13 AM   Modules accepted: Orders

## 2023-01-01 ENCOUNTER — Telehealth: Payer: Self-pay

## 2023-01-01 NOTE — Telephone Encounter (Signed)
Patient aware that she is scheduled to see Dr Michaell Cowing at [facility] on 01-26-23.  Patient agreed to plan and verbalized understanding.  No further questions.

## 2023-01-26 ENCOUNTER — Ambulatory Visit: Payer: Self-pay | Admitting: Surgery

## 2023-01-26 DIAGNOSIS — Z8719 Personal history of other diseases of the digestive system: Secondary | ICD-10-CM

## 2023-01-26 DIAGNOSIS — K257 Chronic gastric ulcer without hemorrhage or perforation: Secondary | ICD-10-CM | POA: Diagnosis present

## 2023-01-26 DIAGNOSIS — K3189 Other diseases of stomach and duodenum: Secondary | ICD-10-CM | POA: Diagnosis present

## 2023-01-29 ENCOUNTER — Telehealth: Payer: Self-pay | Admitting: Physician Assistant

## 2023-01-29 NOTE — Telephone Encounter (Signed)
Inbound call from patient requesting a call back regarding procedure she is suppose to have with [facility]. States she is waiting for something for [place]. Please advise, thank you.

## 2023-01-29 NOTE — Telephone Encounter (Signed)
Left a message at [facility] on the voicemail of Lupita Leash, referral coordinator. Explained the manometry would be scheduled in October. Does Dr Michaell Cowing want to go forward with this or does he want to see if [facility] can get her in sooner?

## 2023-01-29 NOTE — Telephone Encounter (Signed)
Patient was seen in consult by Dr Michaell Cowing on 01-26-23.  Per Dr Michaell Cowing' recommendation, "Would like to get esophageal manometry to get a sense of her function and see if she can tolerate partial versus complete fundoplication and rule out other issues."  We are scheduling out until October for these procedures at the hospital.  Please advise.

## 2023-02-03 NOTE — Telephone Encounter (Signed)
Documentation from [facility] in Care Everywhere. "Telephone Encounter - Ethlyn Gallery, CMA - 02/02/2023 10:00 AM EDT Formatting of this note might be different from the original. Called pt to let her know that I received notification from [facility] that they can't get pt in for the manometry study there till October 2024 w/Dr Nandigam. I asked the pt if she wanted me to put the referral now into [facility] b/c I know they can get her in this month to have the study done. Pt wants to proceed w/[facility] now b/c pt is not wanting to wait any longer on getting all of this taken care of. I did advise that I would place the internal referral and fax the referral to [facility] which then they will call pt directly to schedule. Once I see we have a manometry date in place w/[facility] then I can turn sx orders into scheduling marking the orders to do the sx date 2wks after the manometry study. Pt aware. Electronically signed by Ethlyn Gallery, CMA at 02/02/2023 10:07 AM EDT"  Referral to [place] is canceled. Faxed referral shredded.

## 2023-03-13 ENCOUNTER — Other Ambulatory Visit: Payer: Self-pay | Admitting: Nurse Practitioner

## 2023-03-18 NOTE — Progress Notes (Addendum)
COVID Vaccine Completed:  Date of COVID positive in last 90 days:  PCP - Gilman Schmidt, NP Cardiologist -   Chest x-ray - 12/15/22 Epic EKG - 12/26/22 Epic tachy 113 Stress Test -  ECHO - 10/23/12 Epic Cardiac Cath -  Pacemaker/ICD device last checked: Spinal Cord Stimulator:  Bowel Prep -   Sleep Study -  CPAP -   Fasting Blood Sugar -  Checks Blood Sugar _____ times a day  Last dose of GLP1 agonist-  N/A GLP1 instructions:  N/A   Last dose of SGLT-2 inhibitors-  N/A SGLT-2 instructions: N/A   Blood Thinner Instructions:  Time Aspirin Instructions: Last Dose:  Activity level:  Can go up a flight of stairs and perform activities of daily living without stopping and without symptoms of chest pain or shortness of breath.  Able to exercise without symptoms  Unable to go up a flight of stairs without symptoms of     Anesthesia review:   Patient denies shortness of breath, fever, cough and chest pain at PAT appointment  Patient verbalized understanding of instructions that were given to them at the PAT appointment. Patient was also instructed that they will need to review over the PAT instructions again at home before surgery.

## 2023-03-19 ENCOUNTER — Other Ambulatory Visit: Payer: Self-pay

## 2023-03-19 ENCOUNTER — Encounter (HOSPITAL_COMMUNITY)
Admission: RE | Admit: 2023-03-19 | Discharge: 2023-03-19 | Disposition: A | Discharge status: Home / Self Care | Payer: 59 | Source: Ambulatory Visit | Attending: Surgery | Admitting: Surgery

## 2023-03-19 ENCOUNTER — Encounter (HOSPITAL_COMMUNITY): Payer: Self-pay

## 2023-03-19 VITALS — BP 136/72 | HR 61 | Temp 98.4°F | Resp 16 | Ht 65.0 in | Wt 181.0 lb

## 2023-03-19 DIAGNOSIS — E876 Hypokalemia: Secondary | ICD-10-CM

## 2023-03-19 DIAGNOSIS — R079 Chest pain, unspecified: Secondary | ICD-10-CM | POA: Diagnosis not present

## 2023-03-19 DIAGNOSIS — Z01812 Encounter for preprocedural laboratory examination: Secondary | ICD-10-CM | POA: Diagnosis not present

## 2023-03-19 LAB — CBC
HCT: 34.4 % — ABNORMAL LOW (ref 36.0–46.0)
Hemoglobin: 10.5 g/dL — ABNORMAL LOW (ref 12.0–15.0)
MCH: 30.5 pg (ref 26.0–34.0)
MCHC: 30.5 g/dL (ref 30.0–36.0)
MCV: 100 fL (ref 80.0–100.0)
Platelets: 208 10*3/uL (ref 150–400)
RBC: 3.44 MIL/uL — ABNORMAL LOW (ref 3.87–5.11)
RDW: 14.9 % (ref 11.5–15.5)
WBC: 4.6 10*3/uL (ref 4.0–10.5)
nRBC: 0 % (ref 0.0–0.2)

## 2023-03-19 LAB — BASIC METABOLIC PANEL
Anion gap: 13 (ref 5–15)
BUN: 13 mg/dL (ref 8–23)
CO2: 24 mmol/L (ref 22–32)
Calcium: 9.6 mg/dL (ref 8.9–10.3)
Chloride: 104 mmol/L (ref 98–111)
Creatinine, Ser: 0.78 mg/dL (ref 0.44–1.00)
GFR, Estimated: >60 mL/min (ref >60)
Glucose, Bld: 92 mg/dL (ref 70–99)
Potassium: 4.2 mmol/L (ref 3.5–5.1)
Sodium: 141 mmol/L (ref 135–145)

## 2023-03-19 NOTE — Patient Instructions (Addendum)
SURGICAL WAITING ROOM VISITATION  Patients having surgery or a procedure may have no more than 2 support people in the waiting area - these visitors may rotate.    Children under the age of 31 must have an adult with them who is not the patient.  Due to an increase in RSV and influenza rates and associated hospitalizations, children ages 60 and under may not visit patients in [facility] hospitals.  If the patient needs to stay at the hospital during part of their recovery, the visitor guidelines for inpatient rooms apply. Pre-op nurse will coordinate an appropriate time for 1 support person to accompany patient in pre-op.  This support person may not rotate.    Please refer to the [facility] website for the visitor guidelines for Inpatients (after your surgery is over and you are in a regular room).    Your procedure is scheduled on: 04/01/23   Report to [facility] Main Entrance    Report to admitting at 11:30 AM   Call this number if you have problems the morning of surgery [phone]   Do not eat food :After Midnight.   After Midnight you may have the following liquids until 10:55 AM DAY OF SURGERY  Water Non-Citrus Juices (without pulp, NO RED-Apple, White grape, White cranberry) Black Coffee (NO MILK/CREAM OR CREAMERS, sugar ok)  Clear Tea (NO MILK/CREAM OR CREAMERS, sugar ok) regular and decaf                             Plain Jell-O (NO RED)                                           Fruit ices (not with fruit pulp, NO RED)                                     Popsicles (NO RED)                                                               Sports drinks like Gatorade (NO RED)              Drink 2 Ensure drinks AT 10:00 PM the night before surgery.        The day of surgery:  Drink ONE (1) Pre-Surgery Clear Ensure at 10:55 AM the morning of surgery. Drink in one sitting. Do not sip.  This drink was given to you during your hospital  pre-op appointment  visit. Nothing else to drink after completing the  Pre-Surgery Clear Ensure.          If you have questions, please contact your surgeon's office.   FOLLOW BOWEL PREP AND ANY ADDITIONAL PRE OP INSTRUCTIONS YOU RECEIVED FROM YOUR SURGEON'S OFFICE!!!     Oral Hygiene is also important to reduce your risk of infection.                                    Remember -  BRUSH YOUR TEETH THE MORNING OF SURGERY WITH YOUR REGULAR TOOTHPASTE  DENTURES WILL BE REMOVED PRIOR TO SURGERY PLEASE DO NOT APPLY "Poly grip" OR ADHESIVES!!!   Stop all vitamins and herbal supplements 7 days before surgery.   Take these medicines the morning of surgery with A SIP OF WATER: Flonase, Pantoprazole                               You may not have any metal on your body including hair pins, jewelry, and body piercing             Do not wear make-up, lotions, powders, perfumes, or deodorant  Do not wear nail polish including gel and S&S, artificial/acrylic nails, or any other type of covering on natural nails including finger and toenails. If you have artificial nails, gel coating, etc. that needs to be removed by a nail salon please have this removed prior to surgery or surgery may need to be canceled/ delayed if the surgeon/ anesthesia feels like they are unable to be safely monitored.   Do not shave  48 hours prior to surgery.    Do not bring valuables to the hospital. [facility] IS NOT             RESPONSIBLE   FOR VALUABLES.   Contacts, glasses, dentures or bridgework may not be worn into surgery.   Bring small overnight bag day of surgery.   DO NOT BRING YOUR HOME MEDICATIONS TO THE HOSPITAL. PHARMACY WILL DISPENSE MEDICATIONS LISTED ON YOUR MEDICATION LIST TO YOU DURING YOUR ADMISSION IN THE HOSPITAL!              Please read over the following fact sheets you were given: IF YOU HAVE QUESTIONS ABOUT YOUR PRE-OP INSTRUCTIONS PLEASE CALL [name]    If you received a COVID test during your  pre-op visit  it is requested that you wear a mask when out in public, stay away from anyone that may not be feeling well and notify your surgeon if you develop symptoms. If you test positive for Covid or have been in contact with anyone that has tested positive in the last 10 days please notify you surgeon.    [facility] - Preparing for Surgery Before surgery, you can play an important role.  Because skin is not sterile, your skin needs to be as free of germs as possible.  You can reduce the number of germs on your skin by washing with CHG (chlorahexidine gluconate) soap before surgery.  CHG is an antiseptic cleaner which kills germs and bonds with the skin to continue killing germs even after washing. Please DO NOT use if you have an allergy to CHG or antibacterial soaps.  If your skin becomes reddened/irritated stop using the CHG and inform your nurse when you arrive at Short Stay. Do not shave (including legs and underarms) for at least 48 hours prior to the first CHG shower.  You may shave your face/neck.  Please follow these instructions carefully:  1.  Shower with CHG Soap the night before surgery and the  morning of surgery.  2.  If you choose to wash your hair, wash your hair first as usual with your normal  shampoo.  3.  After you shampoo, rinse your hair and body thoroughly to remove the shampoo.  4.  Use CHG as you would any other liquid soap.  You can apply chg directly to the skin and wash.  Gently with a scrungie or clean washcloth.  5.  Apply the CHG Soap to your body ONLY FROM THE NECK DOWN.   Do   not use on face/ open                           Wound or open sores. Avoid contact with eyes, ears mouth and   genitals (private parts).                       Wash face,  Genitals (private parts) with your normal soap.             6.  Wash thoroughly, paying special attention to the area where your    surgery  will be performed.  7.  Thoroughly rinse your body  with warm water from the neck down.  8.  DO NOT shower/wash with your normal soap after using and rinsing off the CHG Soap.                9.  Pat yourself dry with a clean towel.            10.  Wear clean pajamas.            11.  Place clean sheets on your bed the night of your first shower and do not  sleep with pets. Day of Surgery : Do not apply any lotions/deodorants the morning of surgery.  Please wear clean clothes to the hospital/surgery center.  FAILURE TO FOLLOW THESE INSTRUCTIONS MAY RESULT IN THE CANCELLATION OF YOUR SURGERY  PATIENT SIGNATURE_________________________________  NURSE SIGNATURE__________________________________  ________________________________________________________________________

## 2023-04-01 ENCOUNTER — Ambulatory Visit (HOSPITAL_BASED_OUTPATIENT_CLINIC_OR_DEPARTMENT_OTHER): Payer: 59

## 2023-04-01 ENCOUNTER — Ambulatory Visit (HOSPITAL_COMMUNITY): Payer: Self-pay

## 2023-04-01 ENCOUNTER — Other Ambulatory Visit: Payer: Self-pay

## 2023-04-01 ENCOUNTER — Observation Stay (HOSPITAL_COMMUNITY)
Admission: RE | Admit: 2023-04-01 | Discharge: 2023-04-03 | Disposition: A | Discharge status: Home / Self Care | Payer: 59 | Source: Home / Self Care | Attending: Surgery | Admitting: Surgery

## 2023-04-01 ENCOUNTER — Encounter (HOSPITAL_COMMUNITY): Payer: Self-pay | Admitting: Surgery

## 2023-04-01 ENCOUNTER — Encounter (HOSPITAL_COMMUNITY)
Admission: RE | Disposition: A | Discharge status: Home / Self Care | Payer: Self-pay | Source: Home / Self Care | Attending: Surgery

## 2023-04-01 DIAGNOSIS — K440 Diaphragmatic hernia with obstruction, without gangrene: Secondary | ICD-10-CM | POA: Diagnosis present

## 2023-04-01 DIAGNOSIS — K562 Volvulus: Secondary | ICD-10-CM

## 2023-04-01 DIAGNOSIS — Z8719 Personal history of other diseases of the digestive system: Secondary | ICD-10-CM

## 2023-04-01 DIAGNOSIS — J45909 Unspecified asthma, uncomplicated: Secondary | ICD-10-CM

## 2023-04-01 DIAGNOSIS — K3189 Other diseases of stomach and duodenum: Secondary | ICD-10-CM | POA: Diagnosis not present

## 2023-04-01 DIAGNOSIS — K2940 Chronic atrophic gastritis without bleeding: Secondary | ICD-10-CM | POA: Diagnosis present

## 2023-04-01 DIAGNOSIS — Z96653 Presence of artificial knee joint, bilateral: Secondary | ICD-10-CM | POA: Insufficient documentation

## 2023-04-01 DIAGNOSIS — D500 Iron deficiency anemia secondary to blood loss (chronic): Secondary | ICD-10-CM | POA: Diagnosis not present

## 2023-04-01 DIAGNOSIS — K257 Chronic gastric ulcer without hemorrhage or perforation: Secondary | ICD-10-CM | POA: Diagnosis present

## 2023-04-01 HISTORY — PX: XI ROBOTIC ASSISTED HIATAL HERNIA REPAIR: SHX6889

## 2023-04-01 SURGERY — REPAIR, HERNIA, HIATAL, ROBOT-ASSISTED
Anesthesia: General

## 2023-04-01 MED ORDER — METHOCARBAMOL 500 MG PO TABS: 1000.0000 mg | ORAL_TABLET | Freq: Four times a day (QID) | ORAL | Status: DC | PRN

## 2023-04-01 MED ORDER — FENTANYL CITRATE PF 50 MCG/ML IJ SOSY
PREFILLED_SYRINGE | INTRAMUSCULAR | Status: AC
  Filled 2023-04-01: qty 1

## 2023-04-01 MED ORDER — ONDANSETRON HCL 4 MG/2ML IJ SOLN
INTRAMUSCULAR | Status: DC | PRN
Start: 2023-04-01 — End: 2023-04-01
  Administered 2023-04-01: 4 mg via INTRAVENOUS

## 2023-04-01 MED ORDER — ALUM & MAG HYDROXIDE-SIMETH 200-200-20 MG/5ML PO SUSP: 30.0000 mL | Freq: Four times a day (QID) | ORAL | Status: DC | PRN

## 2023-04-01 MED ORDER — ENOXAPARIN SODIUM 40 MG/0.4ML IJ SOSY
40.0000 mg | PREFILLED_SYRINGE | INTRAMUSCULAR | Status: DC
  Administered 2023-04-02 – 2023-04-03 (×2): 40 mg via SUBCUTANEOUS
  Filled 2023-04-01 (×2): qty 0.4

## 2023-04-01 MED ORDER — MIDAZOLAM HCL 5 MG/5ML IJ SOLN
INTRAMUSCULAR | Status: DC | PRN
  Administered 2023-04-01: 2 mg via INTRAVENOUS

## 2023-04-01 MED ORDER — EPHEDRINE 5 MG/ML INJ
INTRAVENOUS | Status: AC
  Filled 2023-04-01: qty 5

## 2023-04-01 MED ORDER — SCOPOLAMINE 1 MG/3DAYS TD PT72
1.0000 | MEDICATED_PATCH | TRANSDERMAL | Status: DC
  Administered 2023-04-01: 1.5 mg via TRANSDERMAL
  Filled 2023-04-01: qty 1

## 2023-04-01 MED ORDER — BUPIVACAINE-EPINEPHRINE 0.25% -1:200000 IJ SOLN
INTRAMUSCULAR | Status: DC | PRN
  Administered 2023-04-01: 50 mL

## 2023-04-01 MED ORDER — LIDOCAINE HCL (CARDIAC) PF 100 MG/5ML IV SOSY
PREFILLED_SYRINGE | INTRAVENOUS | Status: DC | PRN
  Administered 2023-04-01: 100 mg via INTRAVENOUS

## 2023-04-01 MED ORDER — METOPROLOL TARTRATE 5 MG/5ML IV SOLN: 5.0000 mg | Freq: Four times a day (QID) | INTRAVENOUS | Status: DC | PRN

## 2023-04-01 MED ORDER — PROCHLORPERAZINE EDISYLATE 10 MG/2ML IJ SOLN: 5.0000 mg | Freq: Four times a day (QID) | INTRAMUSCULAR | Status: DC | PRN

## 2023-04-01 MED ORDER — CHLORHEXIDINE GLUCONATE CLOTH 2 % EX PADS: 6.0000 | MEDICATED_PAD | Freq: Once | CUTANEOUS | Status: DC

## 2023-04-01 MED ORDER — ORAL CARE MOUTH RINSE: 15.0000 mL | Freq: Once | OROMUCOSAL | Status: DC

## 2023-04-01 MED ORDER — SUCCINYLCHOLINE CHLORIDE 200 MG/10ML IV SOSY
PREFILLED_SYRINGE | INTRAVENOUS | Status: DC | PRN
  Administered 2023-04-01: 100 mg via INTRAVENOUS

## 2023-04-01 MED ORDER — MAGNESIUM HYDROXIDE 400 MG/5ML PO SUSP: 30.0000 mL | Freq: Every day | ORAL | Status: DC | PRN

## 2023-04-01 MED ORDER — BUPIVACAINE LIPOSOME 1.3 % IJ SUSP
INTRAMUSCULAR | Status: AC
  Filled 2023-04-01: qty 20

## 2023-04-01 MED ORDER — SENNOSIDES-DOCUSATE SODIUM 8.6-50 MG PO TABS: 1.0000 | ORAL_TABLET | Freq: Every evening | ORAL | Status: DC | PRN

## 2023-04-01 MED ORDER — CLINDAMYCIN PHOSPHATE 900 MG/50ML IV SOLN
900.0000 mg | INTRAVENOUS | Status: AC
  Administered 2023-04-01: 900 mg via INTRAVENOUS
  Filled 2023-04-01: qty 50

## 2023-04-01 MED ORDER — TRAZODONE HCL 100 MG PO TABS: 100.0000 mg | ORAL_TABLET | Freq: Every evening | ORAL | Status: DC | PRN

## 2023-04-01 MED ORDER — GABAPENTIN 300 MG PO CAPS
300.0000 mg | ORAL_CAPSULE | ORAL | Status: AC
  Administered 2023-04-01: 300 mg via ORAL
  Filled 2023-04-01: qty 1

## 2023-04-01 MED ORDER — GENTAMICIN SULFATE 40 MG/ML IJ SOLN
5.0000 mg/kg | INTRAVENOUS | Status: AC
  Administered 2023-04-01: 340 mg via INTRAVENOUS
  Filled 2023-04-01: qty 8.5

## 2023-04-01 MED ORDER — HYDROMORPHONE HCL 1 MG/ML IJ SOLN
INTRAMUSCULAR | Status: DC | PRN
  Administered 2023-04-01: .4 mg via INTRAVENOUS
  Administered 2023-04-01 (×2): .2 mg via INTRAVENOUS
  Administered 2023-04-01 (×2): .4 mg via INTRAVENOUS

## 2023-04-01 MED ORDER — PROPOFOL 10 MG/ML IV BOLUS
INTRAVENOUS | Status: AC
  Filled 2023-04-01: qty 20

## 2023-04-01 MED ORDER — PHENOL 1.4 % MT LIQD: 2.0000 | OROMUCOSAL | Status: DC | PRN

## 2023-04-01 MED ORDER — 0.9 % SODIUM CHLORIDE (POUR BTL) OPTIME
TOPICAL | Status: DC | PRN
  Administered 2023-04-01: 1000 mL

## 2023-04-01 MED ORDER — ADULT MULTIVITAMIN W/MINERALS CH
1.0000 | ORAL_TABLET | Freq: Every day | ORAL | Status: DC
  Administered 2023-04-02 – 2023-04-03 (×2): 1 via ORAL
  Filled 2023-04-01 (×2): qty 1

## 2023-04-01 MED ORDER — GABAPENTIN 400 MG PO CAPS
400.0000 mg | ORAL_CAPSULE | Freq: Two times a day (BID) | ORAL | Status: DC
  Administered 2023-04-01 – 2023-04-03 (×4): 400 mg via ORAL
  Filled 2023-04-01 (×4): qty 1

## 2023-04-01 MED ORDER — TRAMADOL HCL 50 MG PO TABS
50.0000 mg | ORAL_TABLET | Freq: Four times a day (QID) | ORAL | Status: DC | PRN
  Administered 2023-04-02: 100 mg via ORAL
  Filled 2023-04-01: qty 2

## 2023-04-01 MED ORDER — PANTOPRAZOLE SODIUM 40 MG PO TBEC
40.0000 mg | DELAYED_RELEASE_TABLET | Freq: Two times a day (BID) | ORAL | Status: DC
  Administered 2023-04-01 – 2023-04-03 (×4): 40 mg via ORAL
  Filled 2023-04-01 (×4): qty 1

## 2023-04-01 MED ORDER — METHOCARBAMOL 1000 MG/10ML IJ SOLN: 1000.0000 mg | Freq: Four times a day (QID) | INTRAVENOUS | Status: DC | PRN

## 2023-04-01 MED ORDER — EPHEDRINE SULFATE-NACL 50-0.9 MG/10ML-% IV SOSY
PREFILLED_SYRINGE | INTRAVENOUS | Status: DC | PRN
  Administered 2023-04-01: 15 mg via INTRAVENOUS
  Administered 2023-04-01: 10 mg via INTRAVENOUS

## 2023-04-01 MED ORDER — GABAPENTIN 400 MG PO CAPS: 800.0000 mg | ORAL_CAPSULE | Freq: Every day | ORAL | Status: DC

## 2023-04-01 MED ORDER — LACTATED RINGERS IV SOLN: Freq: Three times a day (TID) | INTRAVENOUS | Status: DC | PRN

## 2023-04-01 MED ORDER — ONDANSETRON 4 MG PO TBDP: 4.0000 mg | ORAL_TABLET | Freq: Four times a day (QID) | ORAL | Status: DC | PRN

## 2023-04-01 MED ORDER — MAGIC MOUTHWASH: 15.0000 mL | Freq: Four times a day (QID) | ORAL | Status: DC | PRN

## 2023-04-01 MED ORDER — TRAMADOL HCL 50 MG PO TABS: 50.0000 mg | ORAL_TABLET | Freq: Four times a day (QID) | ORAL | 0 refills | Status: AC | PRN

## 2023-04-01 MED ORDER — HYDROMORPHONE HCL 2 MG/ML IJ SOLN
INTRAMUSCULAR | Status: AC
  Filled 2023-04-01: qty 1

## 2023-04-01 MED ORDER — SODIUM CHLORIDE (PF) 0.9 % IJ SOLN
INTRAMUSCULAR | Status: AC
  Filled 2023-04-01: qty 10

## 2023-04-01 MED ORDER — ROSUVASTATIN CALCIUM 10 MG PO TABS
10.0000 mg | ORAL_TABLET | Freq: Every day | ORAL | Status: DC
  Administered 2023-04-01 – 2023-04-02 (×2): 10 mg via ORAL
  Filled 2023-04-01 (×2): qty 1

## 2023-04-01 MED ORDER — FENTANYL CITRATE (PF) 100 MCG/2ML IJ SOLN
INTRAMUSCULAR | Status: AC
  Filled 2023-04-01: qty 2

## 2023-04-01 MED ORDER — ACETAMINOPHEN 500 MG PO TABS
1000.0000 mg | ORAL_TABLET | ORAL | Status: AC
  Administered 2023-04-01: 1000 mg via ORAL
  Filled 2023-04-01: qty 2

## 2023-04-01 MED ORDER — MIDAZOLAM HCL 2 MG/2ML IJ SOLN
INTRAMUSCULAR | Status: AC
  Filled 2023-04-01: qty 2

## 2023-04-01 MED ORDER — FENTANYL CITRATE PF 50 MCG/ML IJ SOSY
25.0000 ug | PREFILLED_SYRINGE | INTRAMUSCULAR | Status: DC | PRN
  Administered 2023-04-01 (×2): 50 ug via INTRAVENOUS

## 2023-04-01 MED ORDER — MENTHOL 3 MG MT LOZG: 1.0000 | LOZENGE | OROMUCOSAL | Status: DC | PRN

## 2023-04-01 MED ORDER — OXYCODONE HCL 5 MG PO TABS
5.0000 mg | ORAL_TABLET | ORAL | Status: DC | PRN
  Administered 2023-04-02: 5 mg via ORAL
  Filled 2023-04-01: qty 1

## 2023-04-01 MED ORDER — BUPIVACAINE LIPOSOME 1.3 % IJ SUSP
INTRAMUSCULAR | Status: DC | PRN
  Administered 2023-04-01: 20 mL

## 2023-04-01 MED ORDER — LACTATED RINGERS IV SOLN: INTRAVENOUS | Status: DC | PRN

## 2023-04-01 MED ORDER — ENSURE PRE-SURGERY PO LIQD: 296.0000 mL | Freq: Once | ORAL | Status: DC

## 2023-04-01 MED ORDER — ONDANSETRON HCL 4 MG/2ML IJ SOLN
INTRAMUSCULAR | Status: AC
  Filled 2023-04-01: qty 2

## 2023-04-01 MED ORDER — DEXAMETHASONE SODIUM PHOSPHATE 4 MG/ML IJ SOLN
4.0000 mg | INTRAMUSCULAR | Status: AC
  Administered 2023-04-01: 4 mg via INTRAVENOUS

## 2023-04-01 MED ORDER — DEXAMETHASONE SODIUM PHOSPHATE 10 MG/ML IJ SOLN
INTRAMUSCULAR | Status: AC
  Filled 2023-04-01: qty 1

## 2023-04-01 MED ORDER — ROCURONIUM BROMIDE 100 MG/10ML IV SOLN
INTRAVENOUS | Status: DC | PRN
  Administered 2023-04-01: 10 mg via INTRAVENOUS
  Administered 2023-04-01: 40 mg via INTRAVENOUS
  Administered 2023-04-01: 10 mg via INTRAVENOUS

## 2023-04-01 MED ORDER — POTASSIUM CHLORIDE 2 MEQ/ML IV SOLN
INTRAVENOUS | Status: DC
  Filled 2023-04-01: qty 1000

## 2023-04-01 MED ORDER — ONDANSETRON HCL 4 MG PO TABS: 4.0000 mg | ORAL_TABLET | Freq: Three times a day (TID) | ORAL | 12 refills | Status: AC | PRN

## 2023-04-01 MED ORDER — ENSURE PRE-SURGERY PO LIQD: 592.0000 mL | Freq: Once | ORAL | Status: DC

## 2023-04-01 MED ORDER — ALBUMIN HUMAN 5 % IV SOLN
INTRAVENOUS | Status: AC
  Filled 2023-04-01: qty 250

## 2023-04-01 MED ORDER — ACETAMINOPHEN 500 MG PO TABS
1000.0000 mg | ORAL_TABLET | Freq: Four times a day (QID) | ORAL | Status: DC
  Administered 2023-04-01 – 2023-04-03 (×5): 1000 mg via ORAL
  Filled 2023-04-01 (×5): qty 2

## 2023-04-01 MED ORDER — LIDOCAINE HCL (PF) 2 % IJ SOLN
INTRAMUSCULAR | Status: AC
  Filled 2023-04-01: qty 5

## 2023-04-01 MED ORDER — CHLORHEXIDINE GLUCONATE 0.12 % MT SOLN: 15.0000 mL | Freq: Once | OROMUCOSAL | Status: DC

## 2023-04-01 MED ORDER — BISACODYL 10 MG RE SUPP: 10.0000 mg | Freq: Every day | RECTAL | Status: DC | PRN

## 2023-04-01 MED ORDER — SIMETHICONE 80 MG PO CHEW
40.0000 mg | CHEWABLE_TABLET | Freq: Four times a day (QID) | ORAL | Status: DC | PRN
  Administered 2023-04-01: 40 mg via ORAL
  Filled 2023-04-01: qty 1

## 2023-04-01 MED ORDER — LACTATED RINGERS IR SOLN
Status: DC | PRN
  Administered 2023-04-01: 1000 mL

## 2023-04-01 MED ORDER — LACTATED RINGERS IV SOLN: INTRAVENOUS | Status: DC

## 2023-04-01 MED ORDER — PROCHLORPERAZINE MALEATE 10 MG PO TABS: 10.0000 mg | ORAL_TABLET | Freq: Four times a day (QID) | ORAL | Status: DC | PRN

## 2023-04-01 MED ORDER — SUGAMMADEX SODIUM 200 MG/2ML IV SOLN
INTRAVENOUS | Status: DC | PRN
  Administered 2023-04-01 (×2): 100 mg via INTRAVENOUS

## 2023-04-01 MED ORDER — AMISULPRIDE (ANTIEMETIC) 5 MG/2ML IV SOLN: 10.0000 mg | Freq: Once | INTRAVENOUS | Status: DC | PRN

## 2023-04-01 MED ORDER — DIPHENHYDRAMINE HCL 50 MG/ML IJ SOLN: 12.5000 mg | Freq: Four times a day (QID) | INTRAMUSCULAR | Status: DC | PRN

## 2023-04-01 MED ORDER — DIPHENHYDRAMINE HCL 12.5 MG/5ML PO ELIX: 12.5000 mg | ORAL_SOLUTION | Freq: Four times a day (QID) | ORAL | Status: DC | PRN

## 2023-04-01 MED ORDER — HYDROMORPHONE HCL 1 MG/ML IJ SOLN
0.5000 mg | INTRAMUSCULAR | Status: DC | PRN
  Administered 2023-04-01: 1 mg via INTRAVENOUS
  Filled 2023-04-01: qty 1

## 2023-04-01 MED ORDER — LORATADINE 10 MG PO TABS
10.0000 mg | ORAL_TABLET | Freq: Every day | ORAL | Status: DC
  Administered 2023-04-02 – 2023-04-03 (×2): 10 mg via ORAL
  Filled 2023-04-01 (×2): qty 1

## 2023-04-01 MED ORDER — POLYVINYL ALCOHOL 1.4 % OP SOLN
1.0000 [drp] | Freq: Three times a day (TID) | OPHTHALMIC | Status: DC | PRN
  Filled 2023-04-01: qty 15

## 2023-04-01 MED ORDER — BUPIVACAINE LIPOSOME 1.3 % IJ SUSP: 20.0000 mL | Freq: Once | INTRAMUSCULAR | Status: DC

## 2023-04-01 MED ORDER — ROCURONIUM BROMIDE 10 MG/ML (PF) SYRINGE
PREFILLED_SYRINGE | INTRAVENOUS | Status: AC
  Filled 2023-04-01: qty 10

## 2023-04-01 MED ORDER — PHENYLEPHRINE HCL-NACL 20-0.9 MG/250ML-% IV SOLN
INTRAVENOUS | Status: DC | PRN
  Administered 2023-04-01: 40 ug/min via INTRAVENOUS

## 2023-04-01 MED ORDER — ONDANSETRON HCL 4 MG/2ML IJ SOLN: 4.0000 mg | Freq: Four times a day (QID) | INTRAMUSCULAR | Status: DC | PRN

## 2023-04-01 MED ORDER — BUPIVACAINE-EPINEPHRINE 0.25% -1:200000 IJ SOLN
INTRAMUSCULAR | Status: AC
  Filled 2023-04-01: qty 1

## 2023-04-01 MED ORDER — LIDOCAINE HCL (PF) 2 % IJ SOLN
INTRAMUSCULAR | Status: DC | PRN
  Administered 2023-04-01: 1.5 mg/kg/h via INTRADERMAL

## 2023-04-01 MED ORDER — LIFITEGRAST 5 % OP SOLN
1.0000 [drp] | Freq: Two times a day (BID) | OPHTHALMIC | Status: DC
  Filled 2023-04-01: qty 1

## 2023-04-01 MED ORDER — METOCLOPRAMIDE HCL 5 MG/ML IJ SOLN: 5.0000 mg | Freq: Three times a day (TID) | INTRAMUSCULAR | Status: DC | PRN

## 2023-04-01 MED ORDER — FENTANYL CITRATE PF 50 MCG/ML IJ SOSY
PREFILLED_SYRINGE | INTRAMUSCULAR | Status: AC
  Administered 2023-04-01: 50 ug via INTRAVENOUS
  Filled 2023-04-01: qty 2

## 2023-04-01 MED ORDER — FENTANYL CITRATE (PF) 100 MCG/2ML IJ SOLN
INTRAMUSCULAR | Status: DC | PRN
  Administered 2023-04-01: 100 ug via INTRAVENOUS
  Administered 2023-04-01 (×2): 50 ug via INTRAVENOUS

## 2023-04-01 MED ORDER — PROPOFOL 10 MG/ML IV BOLUS
INTRAVENOUS | Status: DC | PRN
Start: 2023-04-01 — End: 2023-04-01
  Administered 2023-04-01: 180 mg via INTRAVENOUS

## 2023-04-01 SURGICAL SUPPLY — 70 items
APL PRP STRL LF DISP 70% ISPRP (MISCELLANEOUS) ×1
APL SWBSTK 6 STRL LF DISP (MISCELLANEOUS) ×1
APPLICATOR COTTON TIP 6 STRL (MISCELLANEOUS) ×1 IMPLANT
APPLICATOR COTTON TIP 6IN STRL (MISCELLANEOUS) ×1
APPLIER CLIP 5 13 M/L LIGAMAX5 (MISCELLANEOUS)
APR CLP MED LRG 5 ANG JAW (MISCELLANEOUS)
BAG COUNTER SPONGE SURGICOUNT (BAG) ×1 IMPLANT
BAG SPNG CNTER NS LX DISP (BAG) ×1
BLADE SURG SZ11 CARB STEEL (BLADE) ×1 IMPLANT
CHLORAPREP W/TINT 26 (MISCELLANEOUS) ×1 IMPLANT
CLIP APPLIE 5 13 M/L LIGAMAX5 (MISCELLANEOUS) IMPLANT
COVER SURGICAL LIGHT HANDLE (MISCELLANEOUS) ×1 IMPLANT
COVER TIP SHEARS 8 DVNC (MISCELLANEOUS) IMPLANT
DEFOGGER SCOPE WARMER CLEARIFY (MISCELLANEOUS) IMPLANT
DRAIN CHANNEL 19F RND (DRAIN) IMPLANT
DRAIN PENROSE 0.5X18 (DRAIN) IMPLANT
DRAIN RELI 100 BL SUC LF ST (DRAIN) ×1
DRAPE ARM DVNC X/XI (DISPOSABLE) ×4 IMPLANT
DRAPE COLUMN DVNC XI (DISPOSABLE) ×1 IMPLANT
DRAPE WARM FLUID 44X44 (DRAPES) ×1 IMPLANT
DRIVER NDL LRG 8 DVNC XI (INSTRUMENTS) ×2 IMPLANT
DRIVER NDL MEGA SUTCUT DVNCXI (INSTRUMENTS) ×1 IMPLANT
DRIVER NDLE LRG 8 DVNC XI (INSTRUMENTS) ×2
DRIVER NDLE MEGA SUTCUT DVNCXI (INSTRUMENTS) ×1
DRSG TEGADERM 2-3/8X2-3/4 SM (GAUZE/BANDAGES/DRESSINGS) ×5 IMPLANT
ELECT REM PT RETURN 15FT ADLT (MISCELLANEOUS) ×1 IMPLANT
ENDOLOOP SUT PDS II 0 18 (SUTURE) IMPLANT
EVACUATOR DRAINAGE 10X20 100CC (DRAIN) IMPLANT
EVACUATOR SILICONE 100CC (DRAIN) IMPLANT
FELT TEFLON 4 X1 (Mesh General) ×1 IMPLANT
FORCEPS PROGRASP DVNC XI (FORCEP) ×1 IMPLANT
GAUZE SPONGE 2X2 8PLY STRL LF (GAUZE/BANDAGES/DRESSINGS) ×1 IMPLANT
GLOVE ECLIPSE 8.0 STRL XLNG CF (GLOVE) ×2 IMPLANT
GLOVE INDICATOR 8.0 STRL GRN (GLOVE) ×2 IMPLANT
GOWN STRL REUS W/ TWL XL LVL3 (GOWN DISPOSABLE) ×4 IMPLANT
GOWN STRL REUS W/TWL XL LVL3 (GOWN DISPOSABLE) ×4
GRASPER SUT TROCAR 14GX15 (MISCELLANEOUS) IMPLANT
GRASPER TIP-UP FEN DVNC XI (INSTRUMENTS) ×1 IMPLANT
IRRIG SUCT STRYKERFLOW 2 WTIP (MISCELLANEOUS) ×1
IRRIGATION SUCT STRKRFLW 2 WTP (MISCELLANEOUS) ×1 IMPLANT
KIT BASIN OR (CUSTOM PROCEDURE TRAY) ×1 IMPLANT
KIT TURNOVER KIT A (KITS) IMPLANT
MESH BIO-A 7X10 SYN MAT (Mesh General) IMPLANT
NDL HYPO 22X1.5 SAFETY MO (MISCELLANEOUS) ×1 IMPLANT
NEEDLE HYPO 22X1.5 SAFETY MO (MISCELLANEOUS) ×1
PACK CARDIOVASCULAR III (CUSTOM PROCEDURE TRAY) ×1 IMPLANT
PAD POSITIONING PINK XL (MISCELLANEOUS) ×1 IMPLANT
PENCIL SMOKE EVACUATOR (MISCELLANEOUS) IMPLANT
SCISSORS LAP 5X45 EPIX DISP (ENDOMECHANICALS) IMPLANT
SCISSORS MNPLR CVD DVNC XI (INSTRUMENTS) ×1 IMPLANT
SEAL UNIV 5-12 XI (MISCELLANEOUS) ×3 IMPLANT
SEALER VESSEL EXT DVNC XI (MISCELLANEOUS) ×1 IMPLANT
SOL ELECTROSURG ANTI STICK (MISCELLANEOUS) ×1
SOLUTION ELECTROSURG ANTI STCK (MISCELLANEOUS) ×1 IMPLANT
SPIKE FLUID TRANSFER (MISCELLANEOUS) ×1 IMPLANT
STOPCOCK 4 WAY LG BORE MALE ST (IV SETS) ×2 IMPLANT
SUT ETHIBOND 0 36 GRN (SUTURE) ×2 IMPLANT
SUT ETHIBOND NAB CT1 #1 30IN (SUTURE) ×3 IMPLANT
SUT MNCRL AB 4-0 PS2 18 (SUTURE) ×1 IMPLANT
SUT PROLENE 2 0 SH DA (SUTURE) IMPLANT
SUT VICRYL 0 UR6 27IN ABS (SUTURE) IMPLANT
SUT VLOC 180 2-0 9IN GS21 (SUTURE) IMPLANT
SYR 10ML LL (SYRINGE) ×1 IMPLANT
SYR 20ML LL LF (SYRINGE) ×1 IMPLANT
TOWEL OR 17X26 10 PK STRL BLUE (TOWEL DISPOSABLE) ×1 IMPLANT
TOWEL OR NON WOVEN STRL DISP B (DISPOSABLE) ×1 IMPLANT
TRAY FOLEY MTR SLVR 14FR STAT (SET/KITS/TRAYS/PACK) IMPLANT
TRAY FOLEY MTR SLVR 16FR STAT (SET/KITS/TRAYS/PACK) IMPLANT
TROCAR ADV FIXATION 5X100MM (TROCAR) ×1 IMPLANT
TUBING INSUFFLATION 10FT LAP (TUBING) ×1 IMPLANT

## 2023-04-01 NOTE — Transfer of Care (Signed)
Immediate Anesthesia Transfer of Care Note  Patient: [name] [name]  Procedure(s) Performed: XI ROBOTIC ASSISTED PARAESOPHAGEAL HIATAL HERNIA REPAIR WITH FUNDOPLICATION, BILATERAL TAP BLOCK  Patient Location: PACU  Anesthesia Type:General  Level of Consciousness: awake, alert , oriented, and patient cooperative  Airway & Oxygen Therapy: Patient Spontanous Breathing and Patient connected to face mask oxygen  Post-op Assessment: Report given to RN and Post -op Vital signs reviewed and stable  Post vital signs: Reviewed and stable  Last Vitals:  Vitals Value Taken Time  BP 166/116 04/01/23 1645  Temp    Pulse 77 04/01/23 1650  Resp 17 04/01/23 1650  SpO2 100 % 04/01/23 1650  Vitals shown include unfiled device data.  Last Pain:  Vitals:   04/01/23 1212  TempSrc:   PainSc: 0-No pain      Patients Stated Pain Goal: 3 (04/01/23 1212)  Complications: No notable events documented.

## 2023-04-01 NOTE — Anesthesia Postprocedure Evaluation (Signed)
Anesthesia Post Note  Patient: [name] [name]  Procedure(s) Performed: XI ROBOTIC ASSISTED PARAESOPHAGEAL HIATAL HERNIA REPAIR WITH FUNDOPLICATION, BILATERAL TAP BLOCK     Patient location during evaluation: PACU Anesthesia Type: General Level of consciousness: awake and alert Pain management: pain level controlled Vital Signs Assessment: post-procedure vital signs reviewed and stable Respiratory status: spontaneous breathing, nonlabored ventilation, respiratory function stable and patient connected to nasal cannula oxygen Cardiovascular status: blood pressure returned to baseline and stable Postop Assessment: no apparent nausea or vomiting Anesthetic complications: no  No notable events documented.  Last Vitals:  Vitals:   04/01/23 1745 04/01/23 1800  BP: (!) 140/66 134/68  Pulse: 75 76  Resp: 11 18  Temp: 36.6 C   SpO2: 98% 100%    Last Pain:  Vitals:   04/01/23 1732  TempSrc:   PainSc: 5                  Kennieth Rad

## 2023-04-01 NOTE — Op Note (Signed)
04/01/2023  4:23 PM  PATIENT:  [name] [name]  62 y.o. 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 ROBOTIC reduction of paraesophageal hiatal hernia 2. Type II mediastinal dissection. 3. Primary repair of hiatal hernia over pledgets.  4. Anterior & posterior gastropexy. 5. Toupet (270 degree partial posterior x 4cm)  fundoplication 6. Mesh reinforcement with absorbable mesh  SURGEON:  Ardeth Sportsman, MD  ASSISTANT:  Angelena Form, MD  An experienced assistant was required given the standard of surgical care given the complexity of the case.  This assistant was needed for exposure, dissection, suction, tissue approximation, retraction, perception, etc  ANESTHESIA:  General endotracheal intubation anesthesia (GETA) and Regional TRANSVERSUS ABDOMINIS PLANE (TAP) nerve block -BILATERAL for perioperative & postoperative pain control at the level of the transverse abdominis & preperitoneal spaces along the flank at the anterior axillary line, from subcostal ridge to iliac crest under laparoscopic guidance provided with liposomal bupivacaine (Experel) 20mL mixed with 50 mL of bupivicaine 0.25% with epinephrine  Estimated Blood Loss (EBL):   Total I/O In: 1208.5 [I.V.:1050; IV Piggyback:158.5] Out: 450 [Urine:300; Blood:150].   (See anesthesia record)  Delay start of Pharmacological VTE agent (>24hrs) due to concerns of significant anemia, surgical blood loss, or risk of bleeding?:  no  DRAINS: (None), 19 Fr Blake drain with tip resting in the mediastinum, and 19 Fr Blake drain with tip resting  in the RUQ   SPECIMEN:  Hernia sac (not sent)  DISPOSITION OF SPECIMEN:  Pathology and (not applicable)  COUNTS:  Sponge, needle, & instrument counts CORRECT  PLAN OF CARE: Admit to inpatient   PATIENT DISPOSITION:  PACU - hemodynamically stable.  INDICATION:   Patient with symptomatic paraesophageal hiatal hernia.  The patient has had extensive work-up & we feel the patient will benefit from repair:  The anatomy & physiology of the foregut and anti-reflux mechanism was discussed.  The pathophysiology of hiatal herniation and GERD was discussed.  Natural history risks without surgery was discussed.   The patient's symptoms are not adequately controlled by medicines and other non-operative treatments.  I feel the risks of no intervention will lead to serious problems that outweigh the operative risks; therefore, I recommended surgery to reduce the hiatal hernia out of the chest and fundoplication to rebuild the anti-reflux valve and control reflux better.  Need for a thorough workup to rule out the differential diagnosis and plan treatment was explained.  I explained laparoscopic techniques with possible need for an open approach.  Risks such as bleeding, infection, abscess, leak, need for further treatment, heart attack, death, and other risks were discussed.   I noted a good likelihood this will help address the problem.  Goals of post-operative recovery were discussed as well.  Possibility that this will not correct all symptoms was explained.  Post-operative dysphagia, need for short-term liquid & pureed diet, inability to vomit, possibility of reherniation, possible need for medicines to help control symptoms in addition to surgery were discussed.  We will work to minimize complications.   Educational handouts further explaining the pathology, treatment options, and dysphagia diet was given as well.  Questions were answered.  The patient expresses understanding & wishes  to proceed with  surgery.  OR FINDINGS:   Moderate-sized paraesophageal hiatal hernia with 66% of the stomach in the mediastinum.  There was a 11 x 7 cm hiatal defect.  It is a primary repair with mesh reinforcement was used with GORE BIO-A mesh, a biosynthetic web scaffold made of 67% polyglycolic acid (PGA): 33% trimethylene carbonate (TMC).  The patient has a Toupet (270 degree partial posterior x 4cm)  fundoplication.  The patient has had anterior and posterior gastropexy.  DESCRIPTION:   Informed consent was confirmed.  The patient received IV antibiotics prior to incision.  The underwent general anesthesia without difficulty.  A Foley catheter sterilely placed.  The patient was positioned in split leg with arms tucked. The abdomen was prepped and draped in the sterile fashion.  Surgical time-out confirmed our plan.  I placed a 5 mm port in the left subcostal region using Varess entry technique with the patient in steep reverse Trendelenburg and left side up.  Entry was clean.  We induced carbon dioxide insufflation.  Camera inspection revealed no injury.  Under direct visualization, I placed extra ports.  I also placed a 5 mm port in the left subxiphoid region under direct visualization.  I removed that and placed an Omega-shaped rigid Nathanson liver retractor to lift the left lateral sector of the liver anteriorly to expose the esophageal hiatus.  This was secured to the bed using the iron man system.  The Xi robot was carefully docked and instruments placed and advanced under direct visualization.  We focused on dissection.  We grasped the anterior mediastinal sac at the apex of the crus.  I scored through that and got into the anterior mediastinum.  I was able to free the mediastinal sac from its attachments to the pericardium and bilateral pleura using primarily focused gentle blunt dissection as well as focused vessel sealer dissection.  The right pleura was densely adherent to the hernia sac sign it up  opening up the right pleura to get some decompression and dissection.  I transected phrenoesophageal attachments to the inner right crus, preserving a two centimeter cuff of mediastinal sac until I found the base of the crura.  I then came around anteriorly on the left side and freed up the phrenoesophageal attachments of the mediastinal sac on the medial part of the left crus on the superior half.  I did careful mediastinal dissection to free the mediastinal sac.  With that, we could relieve the suction cup affect of the hernia sac and help reduce the stomach back down into the abdomen, flipped back approriately.    We ligated the short gastrics along the lesser curvature of the stomach about a quarter the way down and then came up proximally over the fundus.  We released the attachments of the stomach to the retroperitoneum until we were able to connect with the prior dissection on the left crus.  We completed the release of phrenoesophageal attachments to the medial part of the left crus down to its base.  With this, we had circumferential mobilization.  We placed the stomach and esophagus on axial tension.  I then did a Type II mediastinal dissection where I freed the esophagus from its attachments to the aorta, spine, pleura, and pericardium using primarily gentle blunt as well as focused ultrasonic dissection.  Ended up having to open up the left pleura as well to get some decompression and relaxation.  We saw the anterior & posterior vagus nerves intact.  We preserved  it at all times.  I proceeded to dissect about 20 cm proximally into the mediastinum.  With that I could straighten out the esophagus and get 5 cm of intra-abdominal length of the esophagus at a best estimation.  I freed the anterior mediastinal sac off the esophagus & stomach.  We saw the anterior vagus nerve and freed the sac off of the vagus.  I dissected out & removed the fatty epiphrenic pads at the esophagogastric junction. With that,  I could better define the esophagogastric junction.  I confirmed the the patient had 5 cm of intra-abdominal esophageal length off tension.  I freed off the greater omentum and splenic attachments to the diaphragm about 7 cm lateral to the left crus carefully in anticipation of doing mesh reinforcement.  I brought the fundus of the stomach posterior to the esophagus over to the right side.  The wrap was mobile with the classic shoe shine maneuver.  Wrap became together gently.  We reflected the stomach left laterally and closed the esophageal hiatus using #1 Ethibond stitch using horizontal mattress stitches with pledgets on both sides x 2, and then 1 more horizontal mattress suture of #1 Ethibond with pledgets closer to the apex of the closure.  The crura had good substance and they came together well without any tension.  Because of the larger defect, I reinforced the repair using a Bio-A 10x7 cm biosynthetic precut mesh.  We did trim the U-shaped defect a little broader we brought the mesh in and laid it over the crural repair, tails anterior over the crura.  I tucked the broader "U" tail of the mesh between the left diaphragm and the spleen, the narrower "U" tail over the right crus.  I secured to the left lateral and left superior sides of the broader "U" tail to the left diaphragm band with 2-0 V lock running suture.  Secured the narrower tail of the "U" of the BioA mesh to the right crus using a running 2-0 V-lock suture as well.  I brought the fundus of the stomach behind the esophagus and cardia to set up a fundoplication wrap.  I did a posterior gastropexy x3  by taking of #1 Ethibond interrupted stitches to the posterior part of the right side of the wrap and thru the mesh and crural closure.  I placed a similar stitch on the left anterior side as well.  That way the stomach covered the mesh and protected it from any esophageal exposure.  With the anterior and posterior gastropexy's, stomach laid  well for a fundoplication wrap.  I then did a classic 4cm Toupet fundoplication on the true esophagus above the cardia using 0 Ethibond stitch in the left superior side of the wrap, apex of the left inner crus then left anterior esophagus and tied that down to do a left anterior gastropexy.  Did a mirror-image stitch on the right side to do a right anterior gastropexy.  I then did 2 more distal pairs of suture between the inner part of the wrap and the anterolateral esophagus.  That way there were 3 total pairs of fundoplication sutures offering a posterior 270 degree wrap.  Measured at 4 cm.  Classic Toupet fundoplication.  The wrap was soft and floppy.  Closure of the hiatus had a 1 cm relaxation to avoid it from being too tight.   Removed sutures needles and hernia sac/epiphrenic fat pads.  Counts were correct.  Did inspection.  Hemostasis was good.  I placed  a drain as noted above.  I saw no evidence of any leak or perforation or other abnormality.  Robotic instruments removed and the robot was undocked.  Came in for laparoscopic reinspection.  Again things look good.  I removed the [facility] liver retractor under direct laparoscopic visualization.  I evacuated carbon dioxide and removed the ports.  Drain secured with vertical mattress Prolene suture.  The skin was closed with Monocryl and sterile dressings applied.  The patient is being extubated and brought back to the recovery room.  I discussed postop care in detail with the patient and family in in the office.  Discussed again with the patient in the holding area. I discussed operative findings, updated the patient's status, discussed probable steps to recovery, and gave postoperative recommendations to the patient's sister, Benniejoe Littie Deeds .  Recommendations were made.  Questions were answered.  She expressed understanding & appreciation.   Ardeth Sportsman, M.D., F.A.C.S. Gastrointestinal and Minimally Invasive Surgery [facility] [address] [place], [place] [address] [phone] Main / Paging

## 2023-04-01 NOTE — Anesthesia Procedure Notes (Addendum)
Procedure Name: Intubation Date/Time: 04/01/2023 1:42 PM  Performed by: [place], Clydene Pugh, CRNAPre-anesthesia Checklist: Patient identified, Emergency Drugs available, Suction available and Patient being monitored Patient Re-evaluated:Patient Re-evaluated prior to induction Oxygen Delivery Method: Circle System Utilized Preoxygenation: Pre-oxygenation with 100% oxygen Induction Type: IV induction Ventilation: Mask ventilation without difficulty Laryngoscope Size: Mac and 3 Grade View: Grade I Tube type: Oral Tube size: 7.0 mm Number of attempts: 1 Airway Equipment and Method: Stylet and Oral airway Placement Confirmation: ETT inserted through vocal cords under direct vision, positive ETCO2 and breath sounds checked- equal and bilateral Secured at: 22 cm Tube secured with: Tape Dental Injury: Teeth and Oropharynx as per pre-operative assessment

## 2023-04-01 NOTE — Discharge Instructions (Signed)
EATING AFTER YOUR ESOPHAGEAL SURGERY (Stomach Fundoplication, Hiatal Hernia repair, Achalasia surgery, etc)  ######################################################################  EAT Start with a pureed / full liquid diet (see below) Gradually transition to a high fiber diet with a fiber supplement over the next month after discharge.    WALK Walk an hour a day.  Control your pain to do that.    CONTROL PAIN Control pain so that you can walk, sleep, tolerate sneezing/coughing, go up/down stairs.  HAVE A BOWEL MOVEMENT DAILY Keep your bowels regular to avoid problems.  OK to try a laxative to override constipation.  OK to use an antidairrheal to slow down diarrhea.  Call if not better after 2 tries  CALL IF YOU HAVE PROBLEMS/CONCERNS Call if you are still struggling despite following these instructions. Call if you have concerns not answered by these instructions  ######################################################################   After your esophageal surgery, expect some sticking with swallowing over the next 1-2 months.    If food sticks when you eat, it is called "dysphagia".  This is due to swelling around your esophagus at the wrap & hiatal diaphragm repair.  It will gradually ease off over the next few months.  To help you through this temporary phase, we start you out on a pureed (blenderized) diet.  Your first meal in the hospital was thin liquids.  You should have been given a pureed diet by the time you left the hospital.  We ask patients to stay on a pureed diet for the first 2-3 weeks to avoid anything getting "stuck" near your recent surgery.  Don't be alarmed if your ability to swallow doesn't progress according to this plan.  Everyone is different and some diets can advance more or less quickly.    It is often helpful to crush your medications or split them as they can sometimes stick, especially the first week or so.   Some BASIC RULES to follow are: Maintain  an upright position whenever eating or drinking. Take small bites - just a teaspoon size bite at a time. Eat slowly.  It may also help to eat only one food at a time. Consider nibbling through smaller, more frequent meals & avoid the urge to eat BIG meals Do not push through feelings of fullness, nausea, or bloatedness Do not mix solid foods and liquids in the same mouthful Try not to "wash foods down" with large gulps of liquids. Avoid carbonated (bubbly/fizzy) drinks.   Avoid foods that make you feel gassy or bloated.  Start with bland foods first.  Wait on trying greasy, fried, or spicy meals until you are tolerating more bland solids well. Understand that it will be hard to burp and belch at first.  This gradually improves with time.  Expect to be more gassy/flatulent/bloated initially.  Walking will help your body manage it better. Consider using medications for bloating that contain simethicone such as  Maalox or Gas-X  Consider crushing her medications, especially smaller pills.  The ability to swallow pills should get easier after a few weeks Eat in a relaxed atmosphere & minimize distractions. Avoid talking while eating.   Do not use straws. Following each meal, sit in an upright position (90 degree angle) for 60 to 90 minutes.  Going for a short walk can help as well If food does stick, don't panic.  Try to relax and let the food pass on its own.  Sipping WARM LIQUID such as strong hot black tea can also help slide it down.  Be gradual in changes & use common sense:  -If you easily tolerating a certain "level" of foods, advance to the next level gradually -If you are having trouble swallowing a particular food, then avoid it.   -If food is sticking when you advance your diet, go back to thinner previous diet (the lower LEVEL) for 1-2 days.  LEVEL 1 = PUREED DIET  Do for the first 2 WEEKS AFTER SURGERY  -Foods in this group are pureed or blenderized to a smooth, mashed  potato-like consistency.  -If necessary, the pureed foods can keep their shape with the addition of a thickening agent.   -Meat should be pureed to a smooth, pasty consistency.  Hot broth or gravy may be added to the pureed meat, approximately 1 oz. of liquid per 3 oz. serving of meat. -CAUTION:  If any foods do not puree into a smooth consistency, swallowing will be more difficult.  (For example, nuts or seeds sometimes do not blend well.)  Hot Foods Cold Foods  Pureed scrambled eggs and cheese Pureed cottage cheese  Baby cereals Thickened juices and nectars  Thinned cooked cereals (no lumps) Thickened milk or eggnog  Pureed [place] toast or pancakes Ensure  Mashed potatoes Ice cream  Pureed parsley, au gratin, scalloped potatoes, candied sweet potatoes Fruit or [place] ice, sherbet  Pureed buttered or alfredo noodles Plain yogurt  Pureed vegetables (no corn or peas) Instant breakfast  Pureed soups and creamed soups Smooth pudding, mousse, custard  Pureed scalloped apples Whipped gelatin  Gravies Sugar, syrup, honey, jelly  Sauces, cheese, tomato, barbecue, white, creamed Cream  Any baby food Creamer  Alcohol in moderation (not beer or champagne) Margarine  Coffee or tea Mayonnaise   Ketchup, mustard   Apple sauce   SAMPLE MENU:  PUREED DIET Breakfast Lunch Dinner  Orange juice, 1/2 cup Cream of wheat, 1/2 cup Pineapple juice, 1/2 cup Pureed [place], barley soup, 3/4 cup Pureed Hawaiian chicken, 3 oz  Scrambled eggs, mashed or blended with cheese, 1/2 cup Tea or coffee, 1 cup  Whole milk, 1 cup  Non-dairy creamer, 2 Tbsp. Mashed potatoes, 1/2 cup Pureed cooled broccoli, 1/2 cup Apple sauce, 1/2 cup Coffee or tea Mashed potatoes, 1/2 cup Pureed spinach, 1/2 cup Frozen yogurt, 1/2 cup Tea or coffee      LEVEL 2 = SOFT DIET  After your first 2 weeks, you can advance to a soft diet.   Keep on this diet until everything goes down easily.  Hot Foods Cold Foods  White fish  Cottage cheese  Stuffed fish Junior baby fruit  Baby food meals Semi thickened juices  Minced soft cooked, scrambled, poached eggs nectars  Souffle & omelets Ripe mashed bananas  Cooked cereals Canned fruit, pineapple sauce, milk  potatoes Milkshake  Buttered or Alfredo noodles Custard  Cooked cooled vegetable Puddings, including tapioca  Sherbet Yogurt  Vegetable soup or alphabet soup Fruit ice, [place] ice  Gravies Whipped gelatin  Sugar, syrup, honey, jelly Junior baby desserts  Sauces:  Cheese, creamed, barbecue, tomato, white Cream  Coffee or tea Margarine   SAMPLE MENU:  LEVEL 2 Breakfast Lunch Dinner  Orange juice, 1/2 cup Oatmeal, 1/2 cup Scrambled eggs with cheese, 1/2 cup Decaffeinated tea, 1 cup Whole milk, 1 cup Non-dairy creamer, 2 Tbsp Pineapple juice, 1/2 cup Minced beef, 3 oz Gravy, 2 Tbsp Mashed potatoes, 1/2 cup Minced fresh broccoli, 1/2 cup Applesauce, 1/2 cup Coffee, 1 cup [place], barley soup, 3/4 cup Minced Hawaiian chicken, 3 oz  Mashed potatoes, 1/2 cup Cooked spinach, 1/2 cup Frozen yogurt, 1/2 cup Non-dairy creamer, 2 Tbsp      LEVEL 3 = CHOPPED DIET  -After all the foods in level 2 (soft diet) are passing through well you should advance up to more chopped foods.  -It is still important to cut these foods into small pieces and eat slowly.  Hot Foods Cold Foods  Poultry Cottage cheese  Chopped Swedish meatballs Yogurt  Meat salads (ground or flaked meat) Milk  Flaked fish (tuna) Milkshakes  Poached or scrambled eggs Soft, cold, dry cereal  Souffles and omelets Fruit juices or nectars  Cooked cereals Chopped canned fruit  Chopped [place] toast or pancakes Canned fruit cocktail  Noodles or pasta (no rice) Pudding, mousse, custard  Cooked vegetables (no frozen peas, corn, or mixed vegetables) Green salad  Canned small sweet peas Ice cream  Creamed soup or vegetable soup Fruit ice, [place] ice  Pureed vegetable soup or alphabet soup  Non-dairy creamer  Ground scalloped apples Margarine  Gravies Mayonnaise  Sauces:  Cheese, creamed, barbecue, tomato, white Ketchup  Coffee or tea Mustard   SAMPLE MENU:  LEVEL 3 Breakfast Lunch Dinner  Orange juice, 1/2 cup Oatmeal, 1/2 cup Scrambled eggs with cheese, 1/2 cup Decaffeinated tea, 1 cup Whole milk, 1 cup Non-dairy creamer, 2 Tbsp Ketchup, 1 Tbsp Margarine, 1 tsp Salt, 1/4 tsp Sugar, 2 tsp Pineapple juice, 1/2 cup Ground beef, 3 oz Gravy, 2 Tbsp Mashed potatoes, 1/2 cup Cooked spinach, 1/2 cup Applesauce, 1/2 cup Decaffeinated coffee Whole milk Non-dairy creamer, 2 Tbsp Margarine, 1 tsp Salt, 1/4 tsp Pureed [place], barley soup, 3/4 cup Barbecue chicken, 3 oz Mashed potatoes, 1/2 cup Ground fresh broccoli, 1/2 cup Frozen yogurt, 1/2 cup Decaffeinated tea, 1 cup Non-dairy creamer, 2 Tbsp Margarine, 1 tsp Salt, 1/4 tsp Sugar, 1 tsp    LEVEL 4:  REGULAR FOODS  -Foods in this group are soft, moist, regularly textured foods.   -This level includes meat and breads, which tend to be the hardest things to swallow.   -Eat very slowly, chew well and continue to avoid carbonated drinks. -most people are at this level in 4-6 weeks  Hot Foods Cold Foods  Baked fish or skinned Soft cheeses - cottage cheese  Souffles and omelets Cream cheese  Eggs Yogurt  Stuffed shells Milk  Spaghetti with meat sauce Milkshakes  Cooked cereal Cold dry cereals (no nuts, dried fruit, coconut)  [place] toast or pancakes Crackers  Buttered toast Fruit juices or nectars  Noodles or pasta (no rice) Canned fruit  Potatoes (all types) Ripe bananas  Soft, cooked vegetables (no corn, lima, or baked beans) Peeled, ripe, fresh fruit  Creamed soups or vegetable soup Cakes (no nuts, dried fruit, coconut)  Canned chicken noodle soup Plain doughnuts  Gravies Ice cream  Bacon dressing Pudding, mousse, custard  Sauces:  Cheese, creamed, barbecue, tomato, white Fruit ice, [place] ice, sherbet   Decaffeinated tea or coffee Whipped gelatin  Pork chops Regular gelatin   Canned fruited gelatin molds   Sugar, syrup, honey, jam, jelly   Cream   Non-dairy   Margarine   Oil   Mayonnaise   Ketchup   Mustard   TROUBLESHOOTING IRREGULAR BOWELS  1) Avoid extremes of bowel movements (no bad constipation/diarrhea)  2) Miralax 17gm mixed in 8oz. water or juice-daily. May use BID as needed.  3) Gas-x,Phazyme, etc. as needed for gas & bloating.  4) Soft,bland diet. No spicy,greasy,fried foods.  5) Prilosec over-the-counter  as needed  6) May hold gluten/wheat products from diet to see if symptoms improve.  7) May try probiotics (Align, Activa, etc) to help calm the bowels down  7) If symptoms become worse call back immediately.    If you have any questions please call our office at [facility]: [phone].    ################################################################  LAPAROSCOPIC SURGERY: POST OP INSTRUCTIONS  ######################################################################  EAT Gradually transition to a high fiber diet with a fiber supplement over the next few weeks after discharge.  Start with a pureed / full liquid diet (see below)  WALK Walk an hour a day.  Control your pain to do that.    CONTROL PAIN Control pain so that you can walk, sleep, tolerate sneezing/coughing, go up/down stairs.  HAVE A BOWEL MOVEMENT DAILY Keep your bowels regular to avoid problems.  OK to try a laxative to override constipation.  OK to use an antidairrheal to slow down diarrhea.  Call if not better after 2 tries  CALL IF YOU HAVE PROBLEMS/CONCERNS Call if you are still struggling despite following these instructions. Call if you have concerns not answered by these instructions  ######################################################################    DIET: See esophageal surgery diet instructions   take your usually prescribed home medications unless  otherwise directed.  Some people find it helpful to split or crushed medications in the first week or so if they stick.  PAIN CONTROL: Pain is best controlled by a usual combination of three different methods TOGETHER: Ice/Heat Over the counter pain medication Prescription pain medication Most patients will experience some swelling and bruising around the incisions.  Ice packs or heating pads (30-60 minutes up to 6 times a day) will help. Use ice for the first few days to help decrease swelling and bruising, then switch to heat to help relax tight/sore spots and speed recovery.  Some people prefer to use ice alone, heat alone, alternating between ice & heat.  Experiment to what works for you.  Swelling and bruising can take several weeks to resolve.   It is helpful to take an over-the-counter pain medication regularly for the first few weeks.  Choose one of the following that works best for you: Naproxen (Aleve, etc)  Two 220mg  tabs twice a day Ibuprofen (Advil, etc) Three 200mg  tabs four times a day (every meal & bedtime) Acetaminophen (Tylenol, etc) 500-650mg  four times a day (every meal & bedtime) A  prescription for pain medication (such as oxycodone, hydrocodone, tramadol, gabapentin, methocarbamol, etc) should be given to you upon discharge.  Take your pain medication as prescribed.  If you are having problems/concerns with the prescription medicine (does not control pain, nausea, vomiting, rash, itching, etc), please call us [phone] to see if we need to switch you to a different pain medicine that will work better for you and/or control your side effect better. If you need a refill on your pain medication, please give [place] 48 hour notice.  contact your pharmacy.  They will contact our office to request authorization. Prescriptions will not be filled after 5 pm or on week-ends  AVOID GETTING CONSTIPATED.   a.  Between the surgery and the pain medications, it is common to experience some  constipation.  b.  Drink plenty of liquids c   ake a fiber supplement 2 times day (such as Metamucil, Citrucel, FiberCon, MiraLax, etc) to have a bowel movement every day. d.  If you have not had a BM by 2 days after surgery: -drink liquids only until  you have a bowel movement - take MiraLAX 2 doses every 2 hours until you have a bowel movement   Watch out for diarrhea.   If you have many loose bowel movements, simplify your diet to bland foods & liquids for a few days.   Stop any stool softeners and decrease your fiber supplement.   Switching to mild anti-diarrheal medications (Kayopectate, Pepto Bismol) can help.   If this worsens or does not improve, please call us.  Wash / shower every day.  You may shower over the dressings as they are waterproof.  Continue to shower over incision(s) after the dressing is off.  It is good for closed incisions and even open wounds to be washed every day.  Shower every day.  Short baths are fine.  Wash the incisions and wounds clean with soap & water.    You may leave closed incisions open to air if it is dry.   You may cover the incision with clean gauze & replace it after your daily shower for comfort.  TEGADERM:  You have clear gauze band-aid dressings over your closed incision(s).  Remove the dressings 3 days after surgery.    ACTIVITIES as tolerated:   You may resume regular (light) daily activities beginning the next day--such as daily self-care, walking, climbing stairs--gradually increasing activities as tolerated.  If you can walk 30 minutes without difficulty, it is safe to try more intense activity such as jogging, treadmill, bicycling, low-impact aerobics, swimming, etc. Save the most intensive and strenuous activity for last such as sit-ups, heavy lifting, contact sports, etc  Refrain from any heavy lifting or straining until you are off narcotics for pain control.   DO NOT PUSH THROUGH PAIN.  Let pain be your guide: If it hurts to do  something, don't do it.  Pain is your body warning you to avoid that activity for another week until the pain goes down. You may drive when you are no longer taking prescription pain medication, you can comfortably wear a seatbelt, and you can safely maneuver your car and apply brakes. You may have sexual intercourse when it is comfortable.  FOLLOW UP in our office Please call [facility] at [phone] to set up an appointment to see your surgeon in the office for a follow-up appointment approximately 2-3 weeks after your surgery. Make sure that you call for this appointment the day you arrive home to insure a convenient appointment time.  10. IF YOU HAVE DISABILITY OR FAMILY LEAVE FORMS, BRING THEM TO THE OFFICE FOR PROCESSING.  DO NOT GIVE THEM TO YOUR DOCTOR.   WHEN TO CALL us [phone]: Poor pain control Reactions / problems with new medications (rash/itching, nausea, etc)  Fever over 101.5 F (38.5 C) Inability to urinate Nausea and/or vomiting Worsening swelling or bruising Continued bleeding from incision. Increased pain, redness, or drainage from the incision   The clinic staff is available to answer your questions during regular business hours (8:30am-5pm).  Please don't hesitate to call and ask to speak to one of our nurses for clinical concerns.   If you have a medical emergency, go to the nearest emergency room or call 911.  A surgeon from [facility] is always on call at the [facility], [place] [address], [place], [place]  [address] ? MAIN: [phone] ? TOLL FREE: [phone] ?  FAX [phone] [email or web address]  ##############################################################

## 2023-04-01 NOTE — Anesthesia Preprocedure Evaluation (Signed)
Anesthesia Evaluation  Patient identified by MRN, date of birth, ID band Patient awake    Reviewed: Allergy & Precautions, NPO status , Patient's Chart, lab work & pertinent test results  Airway Mallampati: II  TM Distance: >3 FB Neck ROM: Full    Dental  (+) Dental Advisory Given   Pulmonary asthma    breath sounds clear to auscultation       Cardiovascular  Rhythm:Regular Rate:Normal     Neuro/Psych  Headaches    GI/Hepatic Neg liver ROS, hiatal hernia, PUD,GERD  Medicated and Poorly Controlled,,  Endo/Other  negative endocrine ROS    Renal/GU negative Renal ROS     Musculoskeletal  (+) Arthritis ,    Abdominal   Peds  Hematology  (+) Blood dyscrasia, anemia   Anesthesia Other Findings   Reproductive/Obstetrics                             Anesthesia Physical Anesthesia Plan  ASA: 3  Anesthesia Plan: General   Post-op Pain Management: Tylenol PO (pre-op)*, Gabapentin PO (pre-op)* and Ketamine IV*   Induction: Intravenous and Rapid sequence  PONV Risk Score and Plan: 3 and Dexamethasone, Ondansetron, Midazolam and Treatment may vary due to age or medical condition  Airway Management Planned: Oral ETT  Additional Equipment:   Intra-op Plan:   Post-operative Plan: Extubation in OR  Informed Consent: I have reviewed the patients History and Physical, chart, labs and discussed the procedure including the risks, benefits and alternatives for the proposed anesthesia with the patient or authorized representative who has indicated his/her understanding and acceptance.     Dental advisory given  Plan Discussed with: CRNA  Anesthesia Plan Comments:        Anesthesia Quick Evaluation

## 2023-04-01 NOTE — H&P (Signed)
04/01/2023     REFERRING PHYSICIAN: Bing Matter, PA  Patient Care Team: Shelton Silvas, NP as PCP - General (Family Medicine) Montel Culver, MD (Gastroenterology) Michaell Cowing, Shawn Route, MD as Consulting Provider (General Surgery)  PROVIDER: Jarrett Soho, MD  [facility] MRN: [id number] DOB: October 21, 1960 04/01/2023   SUBJECTIVE   Chief Complaint: New Consultation (Hiatal hernia)   [name] [name] is a 62 y.o. female  who is seen today as an office consultation  at the request of PA. [place]  for evaluation of hernia with upper GI bleeding  History of Present Illness:  Pleasant active woman. Comes today with her sister. She has been told she has had a hiatal hernia for many years. In send episodes of shortness of breath and chest pain. Prior CT scans and workups. Known hiatal hernia based on CT of the chest since the 2010s. She has had at least 4 episodes of severe upper abdominal pain nausea and vomiting. Last episode she vomited up coffee-ground's and had black tarry stools. That concerned her. For the emergency department. Found to have hemoglobin 6.7. Admitted and transfused. Upper endoscopy confirmed large hiatal hernia now with Sheria Lang ulcerations. And acid control done. Stabilized. Surgical consultation requested.  Patient had a an umbilical hernia repair when she was a teenager. Tubal ligation. No other abdominal surgery. She claims she can walk 30 minutes without difficulty. She did have a colonoscopy in 2019 that apparently showed some active colitis. She was placed on some mild oral immunosuppression. Repeat colonoscopy improved and was tapered off it. No diabetes. Does not smoke. She has been on PPIs for many years. She is on a twice a day now. Seems like she has got worse dysphagia to breads and meats. Usually she can eat okay and then half hour to an hour later she gets worsening weird feeling/nausea/crampiness and vomits. No pulmonary issues.  Claims she can walk at least 1/2-hour gradually.  Medical History:  Past Medical History:  Diagnosis Date  Arthritis  GERD (gastroesophageal reflux disease)  Hyperlipidemia   Patient Active Problem List  Diagnosis  Incarcerated hiatal hernia  UGIB (upper gastrointestinal bleed)  Chronic Cameron ulcer  History of chronic ulcerative colitis  Chronic gastric volvulus   Past Surgical History:  Procedure Laterality Date  JOINT REPLACEMENT Bilateral  KNEE - Right 12/2017, Left 01/2016    Allergies  Allergen Reactions  Acetaminophen Other (See Comments)  Made the patient "very sleepy"  Penicillins Hives and Itching  FEB 2018 - broke out in hives Has patient had a PCN reaction causing immediate rash, facial/tongue/throat swelling, SOB or lightheadedness with hypotension: Yes Has patient had a PCN reaction causing severe rash involving mucus membranes or skin necrosis: No Has patient had a PCN reaction that required hospitalization: No Has patient had a PCN reaction occurring within the last 10 years: Yes If all of the above answers are "NO", then may proceed with Cephalosporin use.  Topiramate Other (See Comments)  Kidney stones   Current Outpatient Medications on File Prior to Visit  Medication Sig Dispense Refill  cetirizine (ZYRTEC) 10 MG tablet Take by mouth  ferrous sulfate 325 (65 FE) MG tablet Take 325 mg by mouth 2 (two) times daily with meals  FUROsemide (LASIX) 40 MG tablet Take by mouth  gabapentin (NEURONTIN) 400 MG capsule Take by mouth  MULTIVITAMIN ORAL Take by mouth  pantoprazole (PROTONIX) 40 MG DR tablet TAKE 1 TABLET (40 MG TOTAL) BY MOUTH TWICE A DAY BEFORE MEALS  potassium gluconate  2.5 mEq Tab Take by mouth  rosuvastatin (CRESTOR) 10 MG tablet Take 10 mg by mouth at bedtime  traZODone (DESYREL) 100 MG tablet Take 100 mg by mouth at bedtime  TURMERIC ORAL Take 500 mg by mouth once daily  valACYclovir (VALTREX) 1000 MG tablet Take 1,000 mg by mouth 2  (two) times daily   No current facility-administered medications on file prior to visit.   History reviewed. No pertinent family history.   Social History   Tobacco Use  Smoking Status Never  Smokeless Tobacco Never    Social History   Socioeconomic History  Marital status: Divorced  Tobacco Use  Smoking status: Never  Smokeless tobacco: Never  Substance and Sexual Activity  Alcohol use: Not Currently  Drug use: Never   Social Determinants of Health   Food Insecurity: No Food Insecurity (12/15/2022)  Received from [facility]  Hunger Vital Sign  Worried About Running Out of Food in the Last Year: Never true  Ran Out of Food in the Last Year: Never true  Transportation Needs: No Transportation Needs (12/15/2022)  Received from [facility] - Transportation  Lack of Transportation (Medical): No  Lack of Transportation (Non-Medical): No   ############################################################  Review of Systems: A complete review of systems (ROS) was obtained from the patient.  We have reviewed this information and discussed as appropriate with the patient.  See HPI as well for other pertinent ROS.  Constitutional: No fevers, chills, sweats. Weight stable Eyes: No vision changes, No discharge HENT: No sore throats, nasal drainage Lymph: No neck swelling, No bruising easily Pulmonary: No cough, productive sputum CV: No orthopnea, PND . No exertional chest/neck/shoulder/arm pain. Patient can walk 2-3 miles without difficulty.   GI: No personal nor family history of GI/colon cancer, inflammatory bowel disease, irritable bowel syndrome, allergy such as Celiac Sprue, dietary/dairy problems, colitis, ulcers nor gastritis. No recent sick contacts/gastroenteritis. No travel outside the country. No changes in diet.  Renal: No UTIs, No hematuria Genital: No drainage, bleeding, masses Musculoskeletal: No severe joint pain. Good ROM major joints Skin: No sores or  lesions Heme/Lymph: No easy bleeding. No swollen lymph nodes Neuro: No active seizures. No facial droop Psych: No hallucinations. No agitation  OBJECTIVE   Vitals:  01/26/23 1120  BP: 126/75  Pulse: 71  Temp: 36.6 C (97.8 F)  SpO2: 98%  Weight: 83.9 kg (185 lb)  Height: 165.1 cm (5\' 5" )   Body mass index is 30.79 kg/m.  PHYSICAL EXAM:  Constitutional: Not cachectic. Hygeine adequate. Vitals signs as above.  Eyes: No glasses. Vision adequate,Pupils reactive, normal extraocular movements. Sclera nonicteric Neuro: CN II-XII intact. No major focal sensory defects. No major motor deficits. Lymph: No head/neck/groin lymphadenopathy Psych: No severe agitation. No severe anxiety. Judgment & insight Adequate, Oriented x4, HENT: Normocephalic, Mucus membranes moist. No thrush. Hearing: adequate Neck: Supple, No tracheal deviation. No obvious thyromegaly Chest: No pain to chest wall compression. Good respiratory excursion. No audible wheezing CV: Pulses intact. regular. No major extremity edema Ext: No obvious deformity or contracture. Edema: Not present. No cyanosis Skin: No major subcutaneous nodules. Warm and dry Musculoskeletal: Severe joint rigidity not present. No obvious clubbing. No digital petechiae. Mobility: no assist device moving easily without restrictions  Abdomen: Obese Soft. Nondistended. Tenderness at the epigastric region only. Rather mild. Marland Kitchen Hernia: Not present. Diastasis recti: Not present. No hepatomegaly. No splenomegaly.  Genital/Pelvic: Inguinal hernia: Not present. Inguinal lymph nodes: without lymphadenopathy nor hidradenitis.   Rectal: (Deferred)    ###################################################################  Labs, Imaging and Diagnostic Testing:  Located in 'Care Everywhere' section of Epic EMR chart  PRIOR [facility] CLINIC NOTES:  Not applicable  SURGERY NOTES:  Not applicable  PATHOLOGY:  Located in 'Care Everywhere' section of Epic  EMR chart  Assessment and Plan:  DIAGNOSES:  Diagnoses and all orders for this visit:  Incarcerated hiatal hernia  UGIB (upper gastrointestinal bleed)  Chronic Sheria Lang ulcer  History of chronic ulcerative colitis  Chronic gastric volvulus    ASSESSMENT/PLAN  Chronic hiatal hernia that is gotten larger in size and is now incarcerated with worsening episodes of nausea and vomiting. Recent episode of hematemesis requiring transfusion and endoscopy showing Cameron ulcerations.  Given the fact that it is worsening with nausea and vomiting and now with bleeding requiring transfusions, I feel like she would benefit from hiatal hernia surgery. Robotic reduction repair. Fundoplication. Possible mesh reinforcement depending on the size given her relatively young age.  Esophageal manometry done through [facility] reveals normal esophageal peristalsis without any strong evidence of dysmotility or primary esophageal disorder..  Reassuring.  The anatomy & physiology of the foregut and anti-reflux mechanism was discussed. The pathophysiology of hiatal herniation and GERD was discussed. Natural history risks without surgery was discussed. The patient's symptoms are not adequately controlled by medicines and other non-operative treatments. I feel the risks of no intervention will lead to serious problems that outweigh the operative risks; therefore, I recommended surgery to reduce the hiatal hernia out of the chest and fundoplication to rebuild the anti-reflux valve and control reflux better. Need for a thorough workup to rule out the differential diagnosis and plan treatment was explained. I explained minimally invasive techniques with possible need for an open approach.  Risks such as bleeding, infection, abscess, leak,injury to other organs, need for repair of tissues / organs, need for further treatment, stroke, heart attack, death, and other risks were discussed. I noted a good likelihood this  will help address the problem. Goals of post-operative recovery were discussed as well. Possibility that this will not correct all symptoms was explained. Post-operative dysphagia, need for short-term liquid & pureed diet, inability to vomit, possibility of reherniation, possible need for medicines to help control symptoms in addition to surgery were discussed. We will work to minimize complications. Educational handouts further explaining the pathology, treatment options, and dysphagia diet was given as well. Questions were answered. The patient expresses understanding & wishes to proceed with surgery.   She lives by herself and is relatively independent. Her sister is here today was getting the surgery in August. I did caution most patients need help the first 1-2 weeks postop to check up on them. They will try and coordinate help. She is optimistic that they will be totally independent right away like after their knee surgery. I cautioned against that but noted mostly time active people recover well. We will see.  Ardeth Sportsman, MD, FACS, MASCRS Esophageal, Gastrointestinal & Colorectal Surgery Robotic and Minimally Invasive Surgery  [facility] [address], [address] [place], [place] [address] [phone] Fax [phone] Main  CONTACT INFORMATION: Weekday (9AM-5PM): Call [facility] main office at [phone] Weeknight (5PM-9AM) or Weekend/Holiday: Check EPIC "Web Links" tab & use "AMION" (password " [email or web address]") for General Surgery [facility] coverage  Please, DO NOT use SecureChat  (it is not reliable communication to reach operating surgeons & will lead to a delay in care).   Epic staff messaging available for outptient concerns needing 1-2 business  day response.

## 2023-04-01 NOTE — Interval H&P Note (Signed)
History and Physical Interval Note:  04/01/2023 12:02 PM  [name] [name]  has presented today for surgery, with the diagnosis of PARESOPHAGEAL HIATAL HERNIA.  The various methods of treatment have been discussed with the patient and family. After consideration of risks, benefits and other options for treatment, the patient has consented to  Procedure(s): XI ROBOTIC ASSISTED PARAESOPHAGEAL HIATAL HERNIA REPAIR WITH FUNDOPLICATION (N/A) as a surgical intervention.  The patient's history has been reviewed, patient examined, no change in status, stable for surgery.  I have reviewed the patient's chart and labs.  Questions were answered to the patient's satisfaction.    I have re-reviewed the the patient's records, history, medications, and allergies.  I have re-examined the patient.  I again discussed intraoperative plans and goals of post-operative recovery.  The patient agrees to proceed.  [name] [name]  20-Jun-1961 [id number]  Patient Care Team: Rebekah Chesterfield, NP as PCP - General (Internal Medicine) Karie Soda, MD as Consulting Physician (General Surgery) Napoleon Form, MD as Consulting Physician (Gastroenterology) Peterson Ao as Physician Assistant (Gastroenterology)  Patient Active Problem List   Diagnosis Date Noted   Hiatal hernia 12/16/2022   GI bleed 12/16/2022   Upper GI bleed 12/15/2022   Chronic erosive gastritis 06/17/2022   Occult blood in stools 06/16/2022   Melena 06/15/2022   ABLA (acute blood loss anemia) 06/15/2022   Iron deficiency anemia due to chronic blood loss    Anemia 07/09/2017   SIRS (systemic inflammatory response syndrome) (HCC) 07/09/2017   Hypokalemia 07/09/2017   Bronchitis 07/09/2017   Arthritis 07/09/2017   URI (upper respiratory infection) 07/09/2017   OA (osteoarthritis) of knee 01/26/2017   Nephrolithiasis 11/15/2013   Chest pain 10/22/2012   Migraine 10/22/2012   Numbness and tingling in hands 10/22/2012    Past  Medical History:  Diagnosis Date   Allergy    Anemia 07/2017   on meds   Arthritis    Asthma    Black-out (not amnesia) 06/2017   black out spells due to anemia!   Blood transfusion without reported diagnosis    Pt thinks she had 3 units blood in Dec, 2018   GERD (gastroesophageal reflux disease)    History of kidney stones    Kidney stones    Migraine    Restless leg syndrome    Swelling of lower limb    bialteral lower leg swelling    Past Surgical History:  Procedure Laterality Date   BIOPSY  06/16/2022   Procedure: BIOPSY;  Surgeon: Meryl Dare, MD;  Location: [facility];  Service: Gastroenterology;;   COLONOSCOPY     ESOPHAGOGASTRODUODENOSCOPY (EGD) WITH PROPOFOL N/A 06/16/2022   Procedure: ESOPHAGOGASTRODUODENOSCOPY (EGD) WITH PROPOFOL;  Surgeon: Meryl Dare, MD;  Location: [facility];  Service: Gastroenterology;  Laterality: N/A;   ESOPHAGOGASTRODUODENOSCOPY (EGD) WITH PROPOFOL N/A 12/16/2022   Procedure: ESOPHAGOGASTRODUODENOSCOPY (EGD) WITH PROPOFOL;  Surgeon: Sherrilyn Rist, MD;  Location: [facility];  Service: Gastroenterology;  Laterality: N/A;   MULTIPLE TOOTH EXTRACTIONS     right eye lid surgery      TOTAL KNEE ARTHROPLASTY Left 01/26/2017   Procedure: LEFT TOTAL KNEE ARTHROPLASTY;  Surgeon: Ollen Vitoria Conyer, MD;  Location: [facility];  Service: Orthopedics;  Laterality: Left;  with block   TOTAL KNEE ARTHROPLASTY Right 10/26/2017   Procedure: RIGHT TOTAL KNEE ARTHROPLASTY;  Surgeon: Ollen Gracelyn Coventry, MD;  Location: [facility];  Service: Orthopedics;  Laterality: Right;   TUBAL LIGATION  Social History   Socioeconomic History   Marital status: Divorced    Spouse name: Not on file   Number of children: Not on file   Years of education: Not on file   Highest education level: Not on file  Occupational History   Not on file  Tobacco Use   Smoking status: Never   Smokeless tobacco: Never  Vaping Use   Vaping status: Never Used  Substance and  Sexual Activity   Alcohol use: Yes    Alcohol/week: 1.0 standard drink of alcohol    Types: 1 Cans of beer per week   Drug use: No   Sexual activity: Yes    Birth control/protection: Surgical  Other Topics Concern   Not on file  Social History Narrative   Not on file   Social Determinants of Health   Financial Resource Strain: Not on file  Food Insecurity: No Food Insecurity (12/15/2022)   Hunger Vital Sign    Worried About Running Out of Food in the Last Year: Never true    Ran Out of Food in the Last Year: Never true  Transportation Needs: No Transportation Needs (12/15/2022)   PRAPARE - Administrator, Civil Service (Medical): No    Lack of Transportation (Non-Medical): No  Physical Activity: Not on file  Stress: Not on file  Social Connections: Not on file  Intimate Partner Violence: Not At Risk (12/15/2022)   Humiliation, Afraid, Rape, and Kick questionnaire    Fear of Current or Ex-Partner: No    Emotionally Abused: No    Physically Abused: No    Sexually Abused: No    Family History  Problem Relation Age of Onset   Hypertension Mother    Diabetes Mellitus II Mother    Hypertension Father    Diabetes Mellitus II Father    Colon polyps Father    Colon cancer Neg Hx    Esophageal cancer Neg Hx    Stomach cancer Neg Hx    Rectal cancer Neg Hx     Medications Prior to Admission  Medication Sig Dispense Refill Last Dose   carboxymethylcellulose (REFRESH PLUS) 0.5 % SOLN Place 1-2 drops into both eyes 3 (three) times daily as needed (dry/irritated eyes.).      cetirizine (ZYRTEC) 10 MG tablet Take 10 mg by mouth at bedtime.      ferrous sulfate 325 (65 FE) MG tablet Take 1 tablet (325 mg total) by mouth 2 (two) times daily with a meal.      FLONASE ALLERGY RELIEF 50 MCG/ACT nasal spray Place 1 spray into both nostrils 2 (two) times daily as needed (allergies/headache/congestion.).      furosemide (LASIX) 40 MG tablet Take 40 mg by mouth daily as needed  (fluid retention/swelling.).      gabapentin (NEURONTIN) 400 MG capsule Take 800 mg by mouth at bedtime.      Multiple Vitamin (MULTIVITAMIN WITH MINERALS) TABS tablet Take 1 tablet by mouth in the morning.      Multiple Vitamins-Minerals (PRESERVISION AREDS 2 PO) Take 1 tablet by mouth in the morning and at bedtime.      NON FORMULARY Take 2 capsules by mouth in the morning. FitSpresso Weight loss Capsules      Potassium 99 MG TABS Take 99 mg by mouth daily with breakfast.      rosuvastatin (CRESTOR) 10 MG tablet Take 10 mg by mouth at bedtime.      senna-docusate (SENOKOT-S) 8.6-50 MG tablet Take 1 tablet by  mouth at bedtime as needed for moderate constipation. (Patient taking differently: Take 1-2 tablets by mouth at bedtime.) 30 tablet 0    traZODone (DESYREL) 100 MG tablet Take 100 mg by mouth at bedtime as needed for sleep.      TURMERIC PO Take 2,000 mg by mouth in the morning. Qunol      XIIDRA 5 % SOLN Place 1 drop into both eyes every 12 (twelve) hours.      pantoprazole (PROTONIX) 40 MG tablet Take 1 tablet (40 mg total) by mouth 2 (two) times daily. 180 tablet 1     Current Facility-Administered Medications  Medication Dose Route Frequency Provider Last Rate Last Admin   acetaminophen (TYLENOL) tablet 1,000 mg  1,000 mg Oral On Call to OR Karie Soda, MD       bupivacaine liposome (EXPAREL) 1.3 % injection 266 mg  20 mL Infiltration Once Karie Soda, MD       chlorhexidine (PERIDEX) 0.12 % solution 15 mL  15 mL Mouth/Throat Once Mal Amabile, MD       Or   Oral care mouth rinse  15 mL Mouth Rinse Once Mal Amabile, MD       Chlorhexidine Gluconate Cloth 2 % PADS 6 each  6 each Topical Once Karie Soda, MD       And   Chlorhexidine Gluconate Cloth 2 % PADS 6 each  6 each Topical Once Karie Soda, MD       clindamycin (CLEOCIN) IVPB 900 mg  900 mg Intravenous On Call to OR Karie Soda, MD       And   gentamicin (GARAMYCIN) 340 mg in dextrose 5 % 100 mL IVPB  5  mg/kg (Adjusted) Intravenous On Call to OR Karie Soda, MD       dexamethasone (DECADRON) injection 4 mg  4 mg Intravenous On Call to OR Karie Soda, MD       Melene Muller ON 04/02/2023] feeding supplement (ENSURE PRE-SURGERY) liquid 296 mL  296 mL Oral Once Karie Soda, MD       feeding supplement (ENSURE PRE-SURGERY) liquid 592 mL  592 mL Oral Once Karie Soda, MD       gabapentin (NEURONTIN) capsule 300 mg  300 mg Oral On Call to OR Karie Soda, MD       lactated ringers infusion   Intravenous Continuous Mal Amabile, MD       scopolamine (TRANSDERM-SCOP) 1 MG/3DAYS 1.5 mg  1 patch Transdermal On Call to OR Karie Soda, MD         Allergies  Allergen Reactions   Penicillins Hives, Itching and Other (See Comments)    FEB 2018 - broke out in hives Has patient had a PCN reaction causing immediate rash, facial/tongue/throat swelling, SOB or lightheadedness with hypotension: Yes Has patient had a PCN reaction causing severe rash involving mucus membranes or skin necrosis: No Has patient had a PCN reaction that required hospitalization: No Has patient had a PCN reaction occurring within the last 10 years: Yes If all of the above answers are "NO", then may proceed with Cephalosporin use.    Topamax [Topiramate] Other (See Comments)    Kidney stones   Tylenol [Acetaminophen] Other (See Comments)    Made the patient "very sleepy"    BP 138/67   Pulse 72   Temp 97.9 F (36.6 C) (Oral)   Resp 18   LMP 08/25/2011   SpO2 96%   Labs: No results found for this or any previous visit (  from the past 48 hour(s)).  Imaging / Studies: No results found.   Ardeth Sportsman, M.D., F.A.C.S. Gastrointestinal and Minimally Invasive Surgery [facility] [address] [place], [place] [address] [phone] Main / Paging  04/01/2023 12:02 PM    Ardeth Sportsman

## 2023-04-02 ENCOUNTER — Encounter (HOSPITAL_COMMUNITY): Payer: Self-pay | Admitting: Surgery

## 2023-04-02 ENCOUNTER — Observation Stay (HOSPITAL_COMMUNITY): Payer: 59

## 2023-04-02 DIAGNOSIS — K440 Diaphragmatic hernia with obstruction, without gangrene: Secondary | ICD-10-CM | POA: Diagnosis not present

## 2023-04-02 MED ORDER — SIMETHICONE 80 MG PO CHEW
80.0000 mg | CHEWABLE_TABLET | Freq: Four times a day (QID) | ORAL | Status: DC
  Administered 2023-04-02 – 2023-04-03 (×3): 80 mg via ORAL
  Filled 2023-04-02 (×3): qty 1

## 2023-04-02 MED ORDER — IOHEXOL 300 MG/ML  SOLN
20.0000 mL | Freq: Once | INTRAMUSCULAR | Status: AC | PRN
  Administered 2023-04-02: 20 mL via ORAL

## 2023-04-02 MED ORDER — DEXAMETHASONE SODIUM PHOSPHATE 10 MG/ML IJ SOLN
8.0000 mg | Freq: Two times a day (BID) | INTRAMUSCULAR | Status: DC
  Administered 2023-04-02 – 2023-04-03 (×3): 8 mg via INTRAVENOUS
  Filled 2023-04-02 (×3): qty 1

## 2023-04-02 MED ORDER — METOCLOPRAMIDE HCL 5 MG/ML IJ SOLN: 5.0000 mg | Freq: Three times a day (TID) | INTRAMUSCULAR | Status: DC | PRN

## 2023-04-02 NOTE — Plan of Care (Signed)
  Problem: Education: Goal: Knowledge of General Education information will improve Description: Including pain rating scale, medication(s)/side effects and non-pharmacologic comfort measures Outcome: Progressing   Problem: Nutrition: Goal: Adequate nutrition will be maintained Outcome: Progressing   

## 2023-04-02 NOTE — Care Management Obs Status (Signed)
MEDICARE OBSERVATION STATUS NOTIFICATION   Patient Details  Name: [name] [name] MRN: [id number] Date of Birth: 1960/09/13   Medicare Observation Status Notification Given:  Hart Robinsons, LCSW 04/02/2023, 4:17 PM

## 2023-04-02 NOTE — Progress Notes (Signed)
04/02/2023  [name] [name] [id number] [birth year]  CARE TEAM: PCP: Rebekah Chesterfield, NP  Outpatient Care Team: Patient Care Team: Rebekah Chesterfield, NP as PCP - General (Internal Medicine) Karie Soda, MD as Consulting Physician (General Surgery) Napoleon Form, MD as Consulting Physician (Gastroenterology) Peterson Ao as Physician Assistant (Gastroenterology)  Inpatient Treatment Team: Treatment Team:  Karie Soda, MD Hilliard Clark, RN Kelby Aline, RN Lucia Gaskins, RPH [place], Garey Ham, RN   Problem List:   Principal Problem:   Incarcerated hiatal hernia Active Problems:   Iron deficiency anemia due to chronic blood loss   Chronic erosive gastritis   Chronic gastric volvulus   History of chronic ulcerative colitis   Chronic Sheria Lang ulcer   04/01/2023  POST-OPERATIVE DIAGNOSIS:  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 ROBOTIC reduction of paraesophageal hiatal hernia 2. Type II mediastinal dissection. 3. Primary repair of hiatal hernia over pledgets.  4. Anterior & posterior gastropexy. 5. Toupet (270 degree partial posterior x 4cm)  fundoplication 6. Mesh reinforcement with absorbable mesh   SURGEON:  Ardeth Sportsman, MD  OR FINDINGS:   Moderate-sized paraesophageal hiatal hernia with 66% of the stomach in the mediastinum.  There was a 11 x 7 cm hiatal defect.   It is a primary repair with mesh reinforcement was used with GORE BIO-A mesh, a biosynthetic web scaffold made of 67% polyglycolic acid (PGA): 33% trimethylene carbonate (TMC).   The patient has a Toupet (270 degree partial posterior x 4cm)  fundoplication.  The patient has had anterior and posterior gastropexy.    Assessment [facility] Stay = 0 days) 1 Day Post-Op    Recovering OK    Plan:  Clear liquids  Esophagram today.  If no leak or obstruction, to advance to dysphagia 1/pured diet.  Foley should  be.  Keep drain for now and most likely removed.  nausea and pain control.    -VTE prophylaxis- SCDs, etc  -mobilize as tolerated to help recovery  -Disposition:  Disposition:  The patient is from: Home Anticipate discharge to:  Home Anticipated Date of Discharge is:  August 30,2024   Barriers to discharge:  Need for inpatient procedure/study and Pending Clinical improvement (more likely than not)  Patient currently is NOT MEDICALLY STABLE for discharge from the hospital from a surgery standpoint.      I reviewed nursing notes, last 24 h vitals and pain scores, last 48 h intake and output, last 24 h labs and trends, and last 24 h imaging results.  I have reviewed this patient's available data, including medical history, events of note, test results, etc as part of my evaluation.   A significant portion of that time was spent in counseling. Care during the described time interval was provided by me.  This care required straight-forward level of medical decision making.  04/02/2023    Subjective: (Chief complaint)  Pain most controlled.  Mobilizing more.  Tolerating sips.  Nursing just outside room.  Objective:  Vital signs:  Vitals:   04/01/23 2015 04/01/23 2109 04/02/23 0209 04/02/23 0614  BP: (!) 151/73 (!) 149/80 118/79 129/64  Pulse: 88 83 80 60  Resp: 18 18 18 18   Temp: 98.7 F (37.1 C) 98.3 F (36.8 C) 98.6 F (37 C) 98.4 F (36.9 C)  TempSrc: Oral Oral Oral Oral  SpO2: 98% 95% 94% 98%  Weight:      Height:  Last BM Date : 03/31/23  Intake/Output   Yesterday:  08/28 0701 - 08/29 0700 In: 9147.8 [P.O.:720; I.V.:1765.7; IV Piggyback:158.5] Out: 2360 [Urine:1975; Drains:235; Blood:150] This shift:  No intake/output data recorded.  Bowel function:  Flatus: YES  BM:  No  Drain: Serosanguinous   Physical Exam:  General: Pt awake/alert in no acute distress Eyes: PERRL, normal EOM.  Sclera clear.  No icterus Neuro: CN II-XII  intact w/o focal sensory/motor deficits. Lymph: No head/neck/groin lymphadenopathy Psych:  No delerium/psychosis/paranoia.  Oriented x 4 HENT: Normocephalic, Mucus membranes moist.  No thrush Neck: Supple, No tracheal deviation.  No obvious thyromegaly Chest: No pain to chest wall compression.  Good respiratory excursion.  No audible wheezing CV:  Pulses intact.  Regular rhythm.  No major extremity edema MS: Normal AROM mjr joints.  No obvious deformity  Abdomen: Soft.  Nondistended.  Mildly tender at incisions only.  No evidence of peritonitis.  No incarcerated hernias.  Ext:   No deformity.  No mjr edema.  No cyanosis Skin: No petechiae / purpurea.  No major sores.  Warm and dry    Results:   Cultures: No results found for this or any previous visit (from the past 720 hour(s)).  Labs: No results found for this or any previous visit (from the past 48 hour(s)).  Imaging / Studies: No results found.  Medications / Allergies: per chart  Antibiotics: Anti-infectives (From admission, onward)    Start     Dose/Rate Route Frequency Ordered Stop   04/01/23 1200  clindamycin (CLEOCIN) IVPB 900 mg       Placed in "And" Linked Group   900 mg 100 mL/hr over 30 Minutes Intravenous On call to O.R. 04/01/23 1149 04/01/23 1353   04/01/23 1200  gentamicin (GARAMYCIN) 340 mg in dextrose 5 % 100 mL IVPB       Placed in "And" Linked Group   5 mg/kg  67 kg (Adjusted) 108.5 mL/hr over 60 Minutes Intravenous On call to O.R. 04/01/23 1149 04/01/23 1417         Note: Portions of this report may have been transcribed using voice recognition software. Every effort was made to ensure accuracy; however, inadvertent computerized transcription errors may be present.   Any transcriptional errors that result from this process are unintentional.    Ardeth Sportsman, MD, FACS, MASCRS Esophageal, Gastrointestinal & Colorectal Surgery Robotic and Minimally Invasive Surgery  [facility] [address] [place], [place] [address] [phone] Fax [phone] Main  CONTACT INFORMATION: Weekday (9AM-5PM): Call [facility] main office at [phone] Weeknight (5PM-9AM) or Weekend/Holiday: Check EPIC "Web Links" tab & use "AMION" (password " [email or web address]") for General Surgery [facility] coverage  Please, DO NOT use SecureChat  (it is not reliable communication to reach operating surgeons & will lead to a delay in care).   Epic staff messaging available for outptient concerns needing 1-2 business day response.      04/02/2023  7:28 AM

## 2023-04-02 NOTE — Progress Notes (Signed)
   04/02/23 1617  TOC Brief Assessment  Insurance and Status Reviewed  Patient has primary care physician Yes  Home environment has been reviewed home alone  Prior level of function: independent  Prior/Current Home Services No current home services  Social Determinants of Health Reivew SDOH reviewed no interventions necessary  Readmission risk has been reviewed Yes  Transition of care needs no transition of care needs at this time

## 2023-04-03 DIAGNOSIS — K440 Diaphragmatic hernia with obstruction, without gangrene: Secondary | ICD-10-CM | POA: Diagnosis not present

## 2023-04-03 MED ORDER — BISACODYL 10 MG RE SUPP: 10.0000 mg | Freq: Two times a day (BID) | RECTAL | Status: DC | PRN

## 2023-04-03 MED ORDER — POLYETHYLENE GLYCOL 3350 17 G PO PACK: 17.0000 g | PACK | Freq: Two times a day (BID) | ORAL | Status: DC | PRN

## 2023-04-03 NOTE — Progress Notes (Signed)
AVS reviewed w/ patient who verbalized an understanding. No other questions at this time. PIV x 2 & JP removed as documented. Dressing applied to JP site- pt to remove dressing at home. Pt's cousin, Drinda Butts will be here at noon to pick up pt - pt will wait in the discharge lounge.

## 2023-04-03 NOTE — Discharge Summary (Signed)
Physician Discharge Summary    [name] [name] MRN: [id number] DOB/AGE: Sep 10, 1960 = 62 y.o.  Patient Care Team: Rebekah Chesterfield, NP as PCP - General (Internal Medicine) Karie Soda, MD as Consulting Physician (General Surgery) Napoleon Form, MD as Consulting Physician (Gastroenterology) Peterson Ao as Physician Assistant (Gastroenterology)  Admit date: 04/01/2023  Discharge date: 04/03/2023  Hospital Stay = 0 days    Discharge Diagnoses:  Principal Problem:   Incarcerated hiatal hernia Active Problems:   Iron deficiency anemia due to chronic blood loss   Chronic erosive gastritis   Chronic gastric volvulus   History of chronic ulcerative colitis   Chronic Sheria Lang ulcer   2 Days Post-Op  04/01/2023  POST-OPERATIVE DIAGNOSIS:  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 ROBOTIC reduction of paraesophageal hiatal hernia 2. Type II mediastinal dissection. 3. Primary repair of hiatal hernia over pledgets.  4. Anterior & posterior gastropexy. 5. Toupet (270 degree partial posterior x 4cm)  fundoplication 6. Mesh reinforcement with absorbable mesh   SURGEON:  Ardeth Sportsman, MD   OR FINDINGS:    Moderate-sized paraesophageal hiatal hernia with 66% of the stomach in the mediastinum.  There was a 11 x 7 cm hiatal defect.   It is a primary repair with mesh reinforcement was used with GORE BIO-A mesh, a biosynthetic web scaffold made of 67% polyglycolic acid (PGA): 33% trimethylene carbonate (TMC).   The patient has a Toupet (270 degree partial posterior x 4cm)  fundoplication.  The patient has had anterior and posterior gastropexy.    Consults: Case Management / Social Work and Anesthesia  Hospital Course:   The patient underwent the surgery above.  Postoperatively, the patient gradually mobilized.  Esophagram showed no evidence of obstruction or leak.  She was advance to dysphagia 1 diet.  Pain and other  symptoms were treated aggressively.    By the time of discharge, the patient was walking well the hallways, eating food, having flatus.  Pain was well-controlled on an oral medications.  Based on meeting discharge criteria and continuing to recover, I felt it was safe for the patient to be discharged from the hospital to further recover with close followup. Postoperative recommendations were discussed in detail.  They are written as well.  Discharged Condition: good  Discharge Exam: Blood pressure (!) 128/58, pulse (!) 54, temperature 97.9 F (36.6 C), resp. rate 18, height 5\' 5"  (1.651 m), weight 82.1 kg, last menstrual period 08/25/2011, SpO2 96%.  General: Pt awake/alert/oriented x4 in No acute distress Eyes: PERRL, normal EOM.  Sclera clear.  No icterus Neuro: CN II-XII intact w/o focal sensory/motor deficits. Lymph: No head/neck/groin lymphadenopathy Psych:  No delerium/psychosis/paranoia HENT: Normocephalic, Mucus membranes moist.  No thrush Neck: Supple, No tracheal deviation Chest:  No chest wall pain w good excursion CV:  Pulses intact.  Regular rhythm MS: Normal AROM mjr joints.  No obvious deformity Abdomen: Soft.  Nondistended.  Nontender.  No evidence of peritonitis.  No incarcerated hernias. Ext:  SCDs BLE.  No mjr edema.  No cyanosis Skin: No petechiae / purpura   Disposition:    Follow-up Information     Karie Soda, MD Follow up on 04/20/2023.   Specialties: General Surgery, Colon and Rectal Surgery Why: To follow up after your operation Contact information: [address] [place] [address] [phone]                 Discharge disposition: 01-Home  or Self Care       Discharge Instructions     Call MD for:   Complete by: As directed    Temperature > 101.78F   Call MD for:  extreme fatigue   Complete by: As directed    Call MD for:  hives   Complete by: As directed    Call MD for:  persistant nausea and vomiting   Complete  by: As directed    Call MD for:  redness, tenderness, or signs of infection (pain, swelling, redness, odor or green/yellow discharge around incision site)   Complete by: As directed    Call MD for:  severe uncontrolled pain   Complete by: As directed    Diet general   Complete by: As directed    SEE ESOPHAGEAL SURGERY DIET INSTRUCTIONS  We using usually start you out on a pureed (blenderized) diet. Expect some sticking with swallowing over the next 1-2 months.   This is due to swelling around your esophagus at the wrap & hiatal diaphragm repair.  It will gradually ease off over the next few months.   Discharge instructions   Complete by: As directed    Please see discharge instruction sheets.   Also refer to any handouts/printouts that may have been given from the [facility] surgery office (if you visited [place] there before surgery) Please call our office if you have any questions or concerns [phone]   Driving Restrictions   Complete by: As directed    No driving until off narcotics and can safely swerve away without pain during an emergency   Increase activity slowly   Complete by: As directed    Lifting restrictions   Complete by: As directed    Avoid heavy lifting initially, <20 pounds at first.   Do not push through pain.   You have no specific weight limit: If it hurts to do, DON'T DO IT.    If you feel no pain, you are not injuring anything.  Pain will protect you from injury.   Coughing and sneezing are far more stressful to your incision than any lifting.   Avoid resuming heavy lifting (>50 pounds) or other intense activity until off all narcotic pain medications.   When want to exercise more, give yourself 2 weeks to gradually get back to full intense exercise/activity.   May shower / Bathe   Complete by: As directed    SHOWER EVERY DAY.  It is fine for dressings or wounds to be washed/rinsed.  Use gentle soap & water.  This will help the incisions and/or wounds get clean &  minimize infection.   May walk up steps   Complete by: As directed    Remove dressing in 72 hours   Complete by: As directed    You have closed incisions: Shower and bathe over these incisions with soap and water every day.  It is OK to wash over the dressings: they are waterproof. Remove all surgical dressings on postoperative day #3.  You do not need to replace dressings over the closed incisions unless you feel more comfortable with a Band-Aid covering it.   TEGADERM:  You have clear gauze band-aid dressings over your closed incision(s).  Remove the dressings 3 days after surgery.  Please call our office [phone] if you have further questions.   Sexual Activity Restrictions   Complete by: As directed    Sexual activity as tolerated.  Do not push through pain.  Pain will protect you from  injury.   Walk with assistance   Complete by: As directed    Walk over an hour a day.  May use a walker/cane/companion to help with balance and stamina.       Allergies as of 04/03/2023       Reactions   Penicillins Hives, Itching, Other (See Comments)   FEB 2018 - broke out in hives Has patient had a PCN reaction causing immediate rash, facial/tongue/throat swelling, SOB or lightheadedness with hypotension: Yes Has patient had a PCN reaction causing severe rash involving mucus membranes or skin necrosis: No Has patient had a PCN reaction that required hospitalization: No Has patient had a PCN reaction occurring within the last 10 years: Yes If all of the above answers are "NO", then may proceed with Cephalosporin use.   Topamax [topiramate] Other (See Comments)   Kidney stones   Tylenol [acetaminophen] Other (See Comments)   Made the patient "very sleepy"        Medication List     TAKE these medications    carboxymethylcellulose 0.5 % Soln Commonly known as: REFRESH PLUS Place 1-2 drops into both eyes 3 (three) times daily as needed (dry/irritated eyes.).   cetirizine 10 MG  tablet Commonly known as: ZYRTEC Take 10 mg by mouth at bedtime.   ferrous sulfate 325 (65 FE) MG tablet Take 1 tablet (325 mg total) by mouth 2 (two) times daily with a meal.   Flonase Allergy Relief 50 MCG/ACT nasal spray Generic drug: fluticasone Place 1 spray into both nostrils 2 (two) times daily as needed (allergies/headache/congestion.).   furosemide 40 MG tablet Commonly known as: LASIX Take 40 mg by mouth daily as needed (fluid retention/swelling.).   gabapentin 400 MG capsule Commonly known as: NEURONTIN Take 800 mg by mouth at bedtime.   multivitamin with minerals Tabs tablet Take 1 tablet by mouth in the morning.   NON FORMULARY Take 2 capsules by mouth in the morning. FitSpresso Weight loss Capsules   ondansetron 4 MG tablet Commonly known as: ZOFRAN Take 1 tablet (4 mg total) by mouth every 8 (eight) hours as needed for nausea.   pantoprazole 40 MG tablet Commonly known as: PROTONIX Take 1 tablet (40 mg total) by mouth 2 (two) times daily.   Potassium 99 MG Tabs Take 99 mg by mouth daily with breakfast.   PRESERVISION AREDS 2 PO Take 1 tablet by mouth in the morning and at bedtime.   rosuvastatin 10 MG tablet Commonly known as: CRESTOR Take 10 mg by mouth at bedtime.   senna-docusate 8.6-50 MG tablet Commonly known as: Senokot-S Take 1 tablet by mouth at bedtime as needed for moderate constipation. What changed:  how much to take when to take this   traMADol 50 MG tablet Commonly known as: ULTRAM Take 1-2 tablets (50-100 mg total) by mouth every 6 (six) hours as needed for moderate pain or severe pain.   traZODone 100 MG tablet Commonly known as: DESYREL Take 100 mg by mouth at bedtime as needed for sleep.   TURMERIC PO Take 2,000 mg by mouth in the morning. Qunol        Significant Diagnostic Studies:  No results found for this or any previous visit (from the past 72 hour(s)).  DG ESOPHAGUS W SINGLE CM (SOL OR THIN BA)  Result  Date: 04/02/2023 CLINICAL DATA:  227099 S/P laparoscopic fundoplication [id number] EXAM: ESOPHOGRAM/BARIUM SWALLOW TECHNIQUE: Single contrast examination was performed using water soluble contrast. FLUOROSCOPY: Radiation Exposure Index (as provided by the  fluoroscopic device): 30.1 mGy Kerma COMPARISON:  None Available. FINDINGS: Scout: No free air under the domes of diaphragm. Left lung base and left lateral costophrenic angle appear clear. Markedly distended air-filled stomach with air-fluid level. Patient is status post laparoscopic fundoplication. No evidence of leak. There is passage of most of the contrast from the esophagus into the stomach, however, there is slight delay and holdup of small account of contrast in the lower esophagus just above the GE junction, most likely secondary to postoperative edema. IMPRESSION: 1. Status post laparoscopic fundoplication. No evidence of leak. 2. Markedly distended and gas-filled stomach with air-fluid level. Electronically Signed   By: Jules Schick M.D.   On: 04/02/2023 14:09    Past Medical History:  Diagnosis Date   Allergy    Anemia 07/2017   on meds   Arthritis    Asthma    Black-out (not amnesia) 06/2017   black out spells due to anemia!   Blood transfusion without reported diagnosis    Pt thinks she had 3 units blood in Dec, 2018   GERD (gastroesophageal reflux disease)    History of kidney stones    Kidney stones    Migraine    Restless leg syndrome    SIRS (systemic inflammatory response syndrome) (HCC) 07/09/2017   Swelling of lower limb    bialteral lower leg swelling   URI (upper respiratory infection) 07/09/2017    Past Surgical History:  Procedure Laterality Date   BIOPSY  06/16/2022   Procedure: BIOPSY;  Surgeon: Meryl Dare, MD;  Location: [facility];  Service: Gastroenterology;;   COLONOSCOPY     ESOPHAGOGASTRODUODENOSCOPY (EGD) WITH PROPOFOL N/A 06/16/2022   Procedure: ESOPHAGOGASTRODUODENOSCOPY (EGD) WITH PROPOFOL;   Surgeon: Meryl Dare, MD;  Location: [facility];  Service: Gastroenterology;  Laterality: N/A;   ESOPHAGOGASTRODUODENOSCOPY (EGD) WITH PROPOFOL N/A 12/16/2022   Procedure: ESOPHAGOGASTRODUODENOSCOPY (EGD) WITH PROPOFOL;  Surgeon: Sherrilyn Rist, MD;  Location: [facility];  Service: Gastroenterology;  Laterality: N/A;   MULTIPLE TOOTH EXTRACTIONS     right eye lid surgery      TOTAL KNEE ARTHROPLASTY Left 01/26/2017   Procedure: LEFT TOTAL KNEE ARTHROPLASTY;  Surgeon: Ollen Shaydon Lease, MD;  Location: [facility];  Service: Orthopedics;  Laterality: Left;  with block   TOTAL KNEE ARTHROPLASTY Right 10/26/2017   Procedure: RIGHT TOTAL KNEE ARTHROPLASTY;  Surgeon: Ollen Olie Dibert, MD;  Location: [facility];  Service: Orthopedics;  Laterality: Right;   TUBAL LIGATION     XI ROBOTIC ASSISTED HIATAL HERNIA REPAIR N/A 04/01/2023   Procedure: XI ROBOTIC ASSISTED PARAESOPHAGEAL HIATAL HERNIA REPAIR WITH FUNDOPLICATION, BILATERAL TAP BLOCK;  Surgeon: Karie Soda, MD;  Location: [facility];  Service: General;  Laterality: N/A;    Social History   Socioeconomic History   Marital status: Divorced    Spouse name: Not on file   Number of children: Not on file   Years of education: Not on file   Highest education level: Not on file  Occupational History   Not on file  Tobacco Use   Smoking status: Never   Smokeless tobacco: Never  Vaping Use   Vaping status: Never Used  Substance and Sexual Activity   Alcohol use: Yes    Alcohol/week: 1.0 standard drink of alcohol    Types: 1 Cans of beer per week   Drug use: No   Sexual activity: Yes    Birth control/protection: Surgical  Other Topics Concern   Not on file  Social History Narrative  Not on file   Social Determinants of Health   Financial Resource Strain: Not on file  Food Insecurity: No Food Insecurity (04/01/2023)   Hunger Vital Sign    Worried About Running Out of Food in the Last Year: Never true    Ran Out of Food in the Last Year:  Never true  Transportation Needs: No Transportation Needs (04/01/2023)   PRAPARE - Administrator, Civil Service (Medical): No    Lack of Transportation (Non-Medical): No  Physical Activity: Not on file  Stress: Not on file  Social Connections: Not on file  Intimate Partner Violence: Not At Risk (04/01/2023)   Humiliation, Afraid, Rape, and Kick questionnaire    Fear of Current or Ex-Partner: No    Emotionally Abused: No    Physically Abused: No    Sexually Abused: No    Family History  Problem Relation Age of Onset   Hypertension Mother    Diabetes Mellitus II Mother    Hypertension Father    Diabetes Mellitus II Father    Colon polyps Father    Colon cancer Neg Hx    Esophageal cancer Neg Hx    Stomach cancer Neg Hx    Rectal cancer Neg Hx     Current Facility-Administered Medications  Medication Dose Route Frequency Provider Last Rate Last Admin   acetaminophen (TYLENOL) tablet 1,000 mg  1,000 mg Oral Q6H Karie Soda, MD   1,000 mg at 04/03/23 0514   alum & mag hydroxide-simeth (MAALOX/MYLANTA) 200-200-20 MG/5ML suspension 30 mL  30 mL Oral Q6H PRN Karie Soda, MD       bisacodyl (DULCOLAX) suppository 10 mg  10 mg Rectal Q12H PRN Karie Soda, MD       dexamethasone (DECADRON) injection 8 mg  8 mg Intravenous Catha Gosselin, MD   8 mg at 04/02/23 2019   diphenhydrAMINE (BENADRYL) 12.5 MG/5ML elixir 12.5 mg  12.5 mg Oral Q6H PRN Karie Soda, MD       Or   diphenhydrAMINE (BENADRYL) injection 12.5 mg  12.5 mg Intravenous Q6H PRN Karie Soda, MD       enoxaparin (LOVENOX) injection 40 mg  40 mg Subcutaneous Q24H Karie Soda, MD   40 mg at 04/02/23 0815   gabapentin (NEURONTIN) capsule 400 mg  400 mg Oral BID Karie Soda, MD   400 mg at 04/02/23 2018   HYDROmorphone (DILAUDID) injection 0.5-2 mg  0.5-2 mg Intravenous Q4H PRN Karie Soda, MD   1 mg at 04/01/23 2257   lactated ringers infusion   Intravenous Q8H PRN Karie Soda, MD        Lifitegrast 5 % SOLN 1 drop  1 drop Both Eyes Q12H Karie Soda, MD       loratadine (CLARITIN) tablet 10 mg  10 mg Oral Daily Karie Soda, MD   10 mg at 04/02/23 0815   magic mouthwash  15 mL Oral QID PRN Karie Soda, MD       magnesium hydroxide (MILK OF MAGNESIA) suspension 30 mL  30 mL Oral Daily PRN Karie Soda, MD       menthol-cetylpyridinium (CEPACOL) lozenge 3 mg  1 lozenge Oral PRN Karie Soda, MD       methocarbamol (ROBAXIN) 1,000 mg in dextrose 5 % 100 mL IVPB  1,000 mg Intravenous Q6H PRN Karie Soda, MD       methocarbamol (ROBAXIN) tablet 1,000 mg  1,000 mg Oral Q6H PRN Karie Soda, MD  metoCLOPramide (REGLAN) injection 5-10 mg  5-10 mg Intravenous Q8H PRN Karie Soda, MD       metoprolol tartrate (LOPRESSOR) injection 5 mg  5 mg Intravenous Q6H PRN Karie Soda, MD       multivitamin with minerals tablet 1 tablet  1 tablet Oral Daily Karie Soda, MD   1 tablet at 04/02/23 0816   ondansetron (ZOFRAN-ODT) disintegrating tablet 4 mg  4 mg Oral Q6H PRN Karie Soda, MD       Or   ondansetron [facility]) injection 4 mg  4 mg Intravenous Q6H PRN Karie Soda, MD       oxyCODONE (Oxy IR/ROXICODONE) immediate release tablet 5-10 mg  5-10 mg Oral Q4H PRN Karie Soda, MD   5 mg at 04/02/23 0602   pantoprazole (PROTONIX) EC tablet 40 mg  40 mg Oral BID Karie Soda, MD   40 mg at 04/02/23 2018   phenol (CHLORASEPTIC) mouth spray 2 spray  2 spray Mouth/Throat PRN Karie Soda, MD       polyethylene glycol (MIRALAX / GLYCOLAX) packet 17 g  17 g Oral Q12H PRN Karie Soda, MD       polyvinyl alcohol (LIQUIFILM TEARS) 1.4 % ophthalmic solution 1-2 drop  1-2 drop Both Eyes TID PRN Karie Soda, MD       prochlorperazine (COMPAZINE) tablet 10 mg  10 mg Oral Q6H PRN Karie Soda, MD       Or   prochlorperazine (COMPAZINE) injection 5-10 mg  5-10 mg Intravenous Q6H PRN Karie Soda, MD       rosuvastatin (CRESTOR) tablet 10 mg  10 mg Oral Laurena Slimmer, MD   10 mg  at 04/02/23 2018   scopolamine (TRANSDERM-SCOP) 1 MG/3DAYS 1.5 mg  1 patch Transdermal On Call to OR Karie Soda, MD   1.5 mg at 04/01/23 1210   senna-docusate (Senokot-S) tablet 1 tablet  1 tablet Oral QHS PRN Karie Soda, MD       simethicone [facility]) chewable tablet 40 mg  40 mg Oral Q6H PRN Karie Soda, MD   40 mg at 04/01/23 2257   simethicone (MYLICON) chewable tablet 80 mg  80 mg Oral QID Karie Soda, MD   80 mg at 04/02/23 2019   traMADol (ULTRAM) tablet 50-100 mg  50-100 mg Oral Q6H PRN Karie Soda, MD   100 mg at 04/02/23 2110   traZODone (DESYREL) tablet 100 mg  100 mg Oral QHS PRN Karie Soda, MD         Allergies  Allergen Reactions   Penicillins Hives, Itching and Other (See Comments)    FEB 2018 - broke out in hives Has patient had a PCN reaction causing immediate rash, facial/tongue/throat swelling, SOB or lightheadedness with hypotension: Yes Has patient had a PCN reaction causing severe rash involving mucus membranes or skin necrosis: No Has patient had a PCN reaction that required hospitalization: No Has patient had a PCN reaction occurring within the last 10 years: Yes If all of the above answers are "NO", then may proceed with Cephalosporin use.    Topamax [Topiramate] Other (See Comments)    Kidney stones   Tylenol [Acetaminophen] Other (See Comments)    Made the patient "very sleepy"    Signed:   Ardeth Sportsman, MD, FACS, MASCRS Esophageal, Gastrointestinal & Colorectal Surgery Robotic and Minimally Invasive Surgery  [facility] [address] [place], [place] [address] [phone] Fax [phone] Main  CONTACT INFORMATION:  Weekday (9AM-5PM): Call [facility] main office at [phone] Weeknight (5PM-9AM) or Weekend/Holiday: Check EPIC "Web Links" tab & use "AMION" (password " [email or web address]") for General Surgery [facility] coverage  Please, DO NOT use SecureChat  (it is not reliable  communication to reach operating surgeons & will lead to a delay in care).   Epic staff messaging available for outptient concerns needing 1-2 business day response.      04/03/2023, 7:43 AM

## 2023-04-03 NOTE — Progress Notes (Signed)
Ciera, RN(discharge nurse) went over patient's discharge instructions and removed jp drain. Confirmed when patient's family would arrive to pick patient up. Pt was wheeled downstairs  at 1105 am to the discharge area to wait for family

## 2023-05-18 IMAGING — US US BREAST*R* LIMITED INC AXILLA
1 series · 6 of 6 positions shown · non-contrast
Comparison: Previous exam(s).

CLINICAL DATA: Patient recalled from screening for right breast
mass.

EXAM:
DIGITAL DIAGNOSTIC UNILATERAL RIGHT MAMMOGRAM WITH TOMOSYNTHESIS AND
CAD; ULTRASOUND RIGHT BREAST LIMITED
TECHNIQUE: Right digital diagnostic mammography and breast tomosynthesis was
performed. The images were evaluated with computer-aided detection.;
Targeted ultrasound examination of the right breast was performed

[Series 1: us breast*right* limited inc axilla · 0.06mm/px · 6 of 6 slices shown]
[im 1/6]
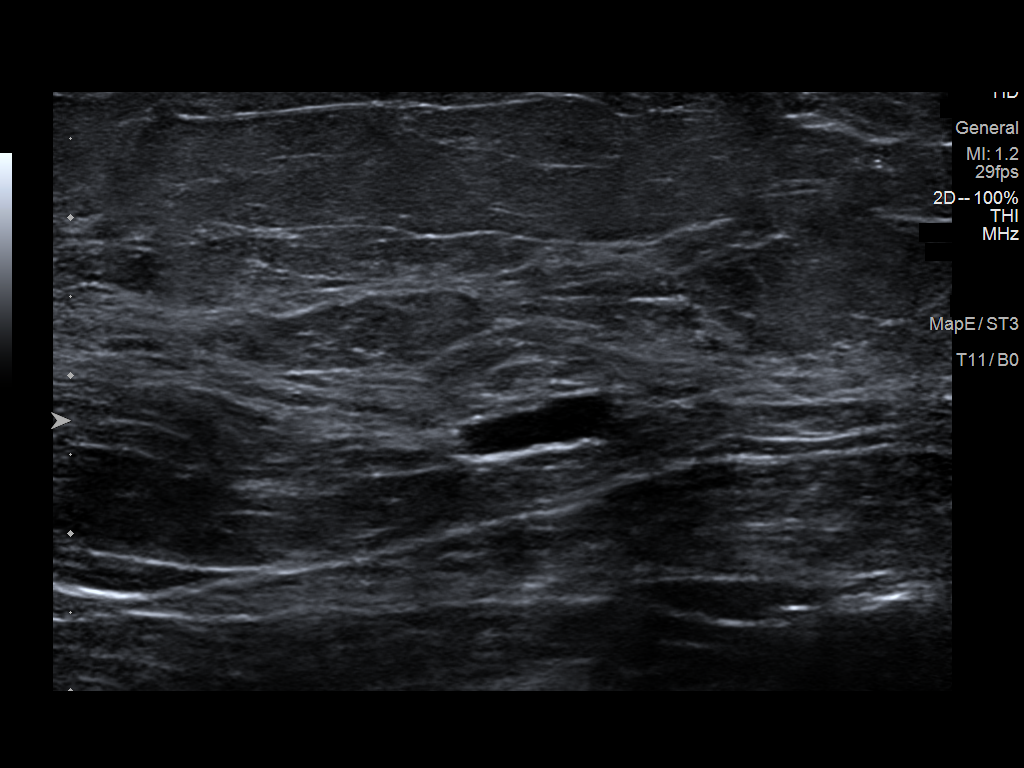
[im 2/6]
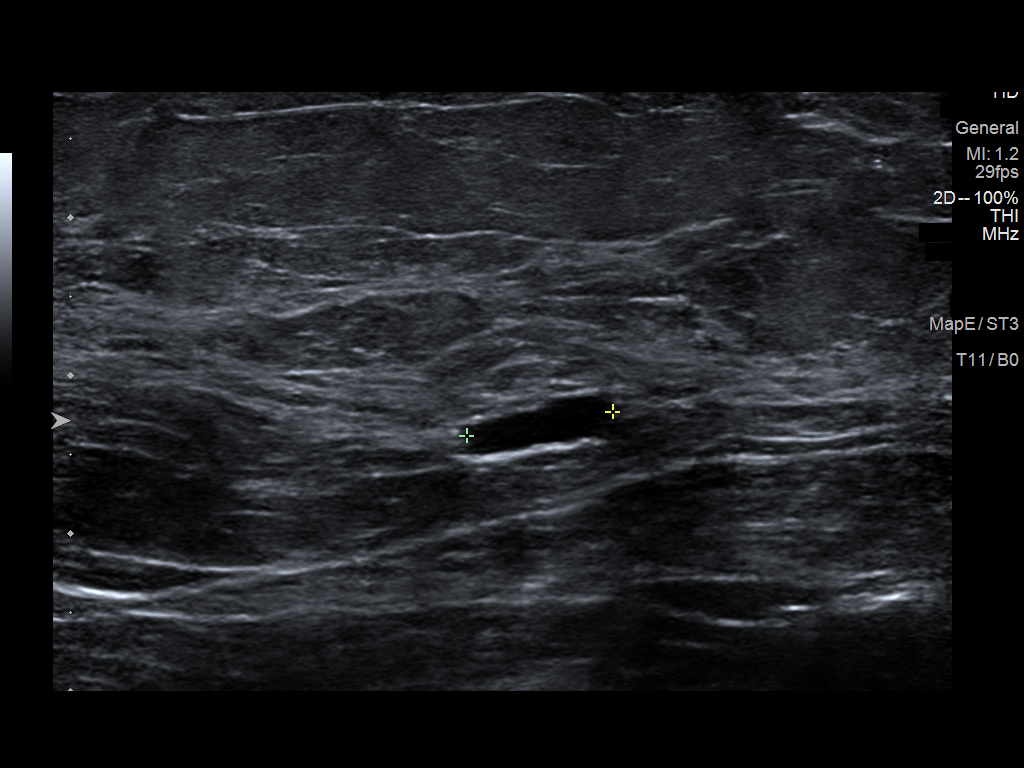
[im 3/6]
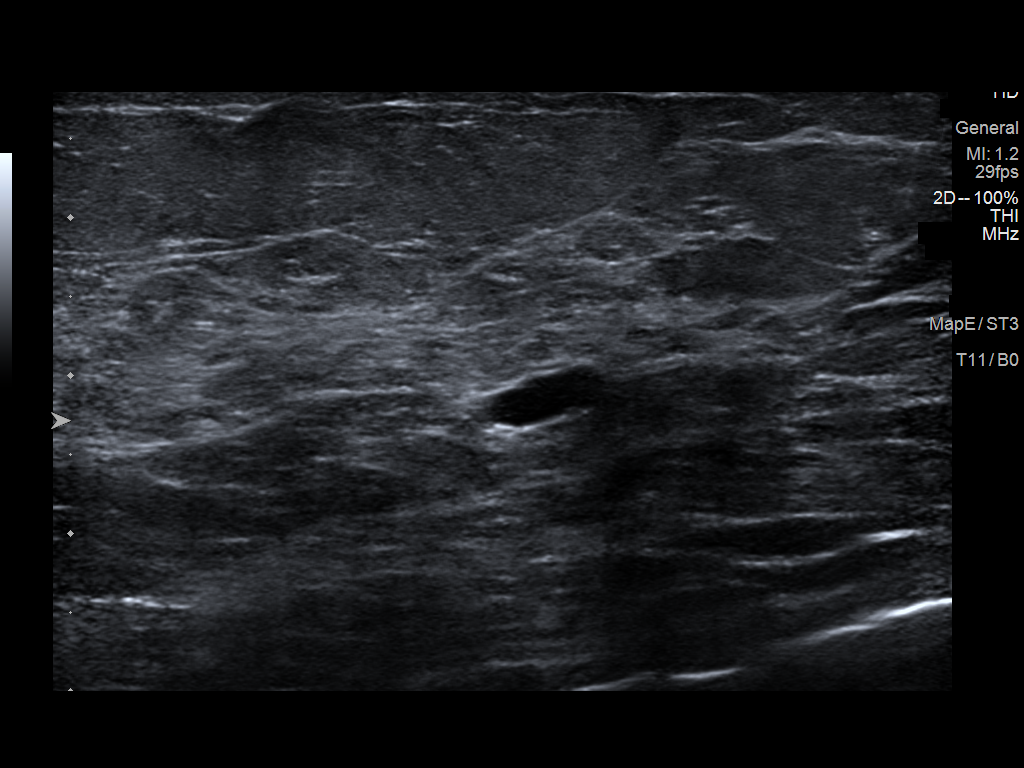
[im 4/6]
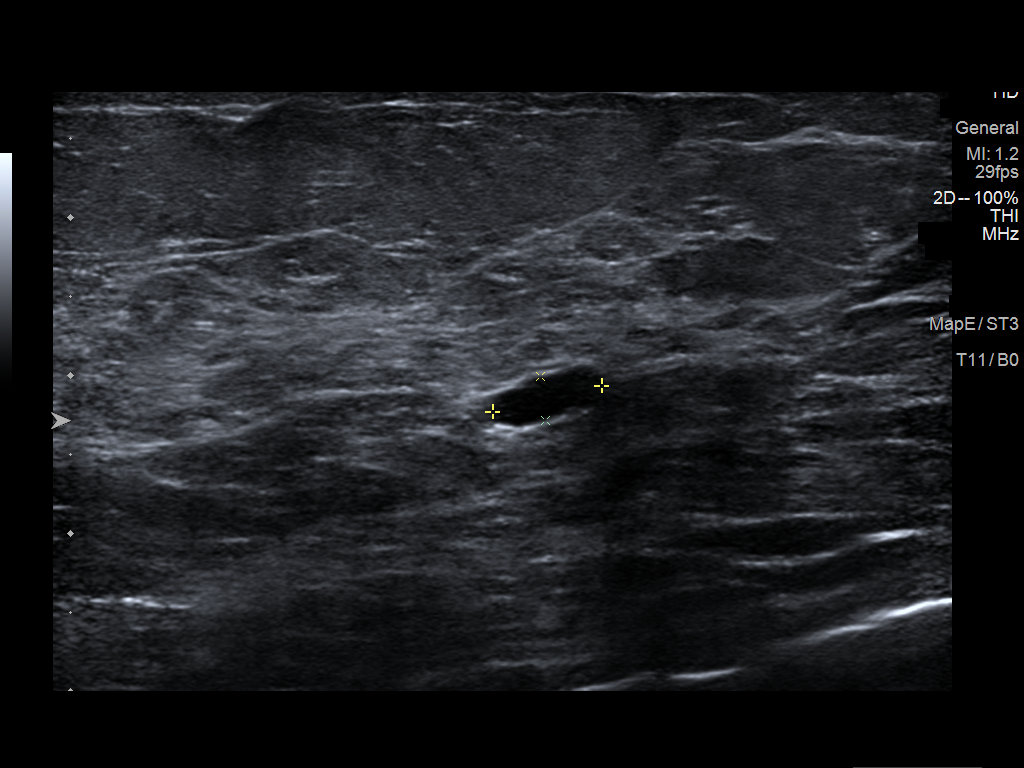
[im 5/6]
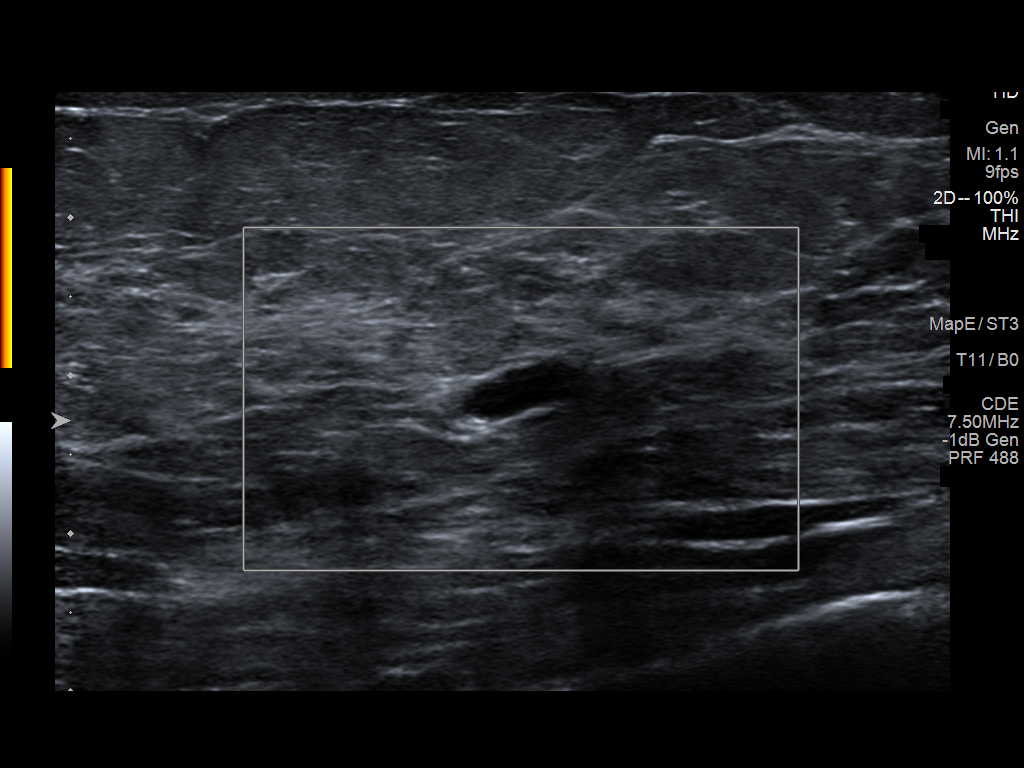
[im 6/6]
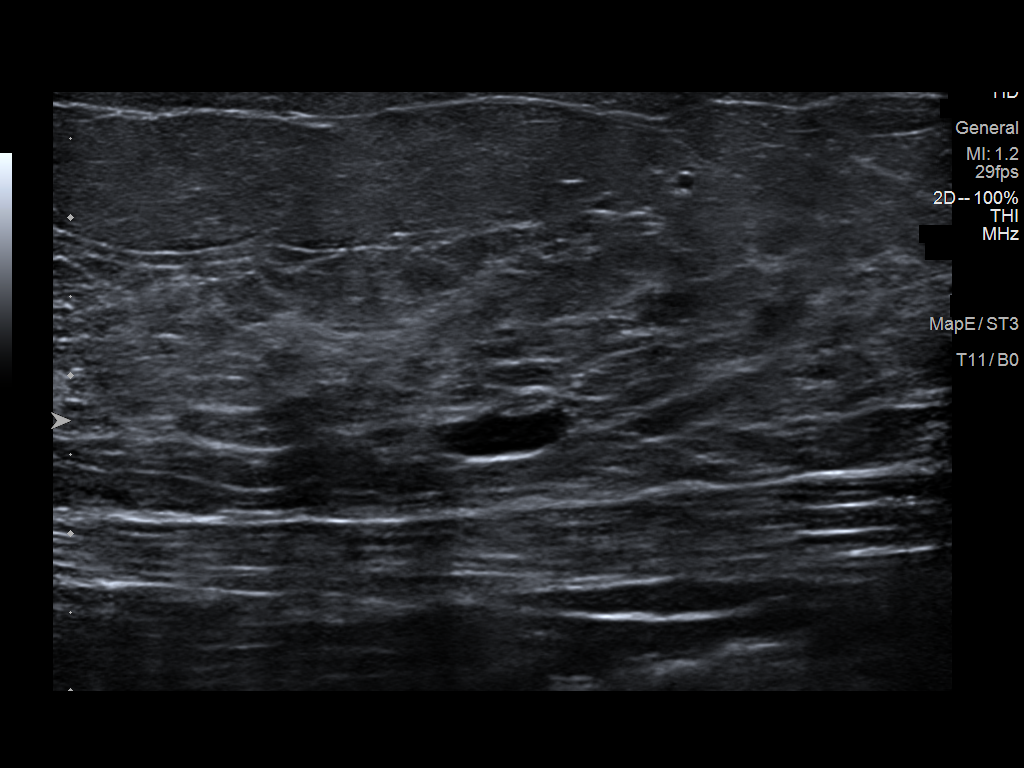

[6 of 6 positions shown; findings below may reference images not displayed]

ACR Breast Density Category b: There are scattered areas of
fibroglandular density.
FINDINGS: There is a persistent low-density oval mass within the medial right
breast posterior depth.

Targeted ultrasound is performed, showing a 9 x 7 x 3 mm cyst right
breast 2 o'clock position 3 cm from the nipple.
IMPRESSION: Right breast cyst.  No mammographic evidence for malignancy.

RECOMMENDATION:
Screening mammogram in one year.(Code:XX-P-EV2)

I have discussed the findings and recommendations with the patient.
If applicable, a reminder letter will be sent to the patient
regarding the next appointment.

BI-RADS CATEGORY  2: Benign.

## 2023-05-18 IMAGING — MG MM DIGITAL DIAGNOSTIC UNILAT*R* W/ TOMO W/ CAD
4 series · 4 of 12 positions shown · non-contrast
Comparison: Previous exam(s).

CLINICAL DATA: Patient recalled from screening for right breast
mass.

EXAM:
DIGITAL DIAGNOSTIC UNILATERAL RIGHT MAMMOGRAM WITH TOMOSYNTHESIS AND
CAD; ULTRASOUND RIGHT BREAST LIMITED
TECHNIQUE: Right digital diagnostic mammography and breast tomosynthesis was
performed. The images were evaluated with computer-aided detection.;
Targeted ultrasound examination of the right breast was performed

[R MLO synth-2D]
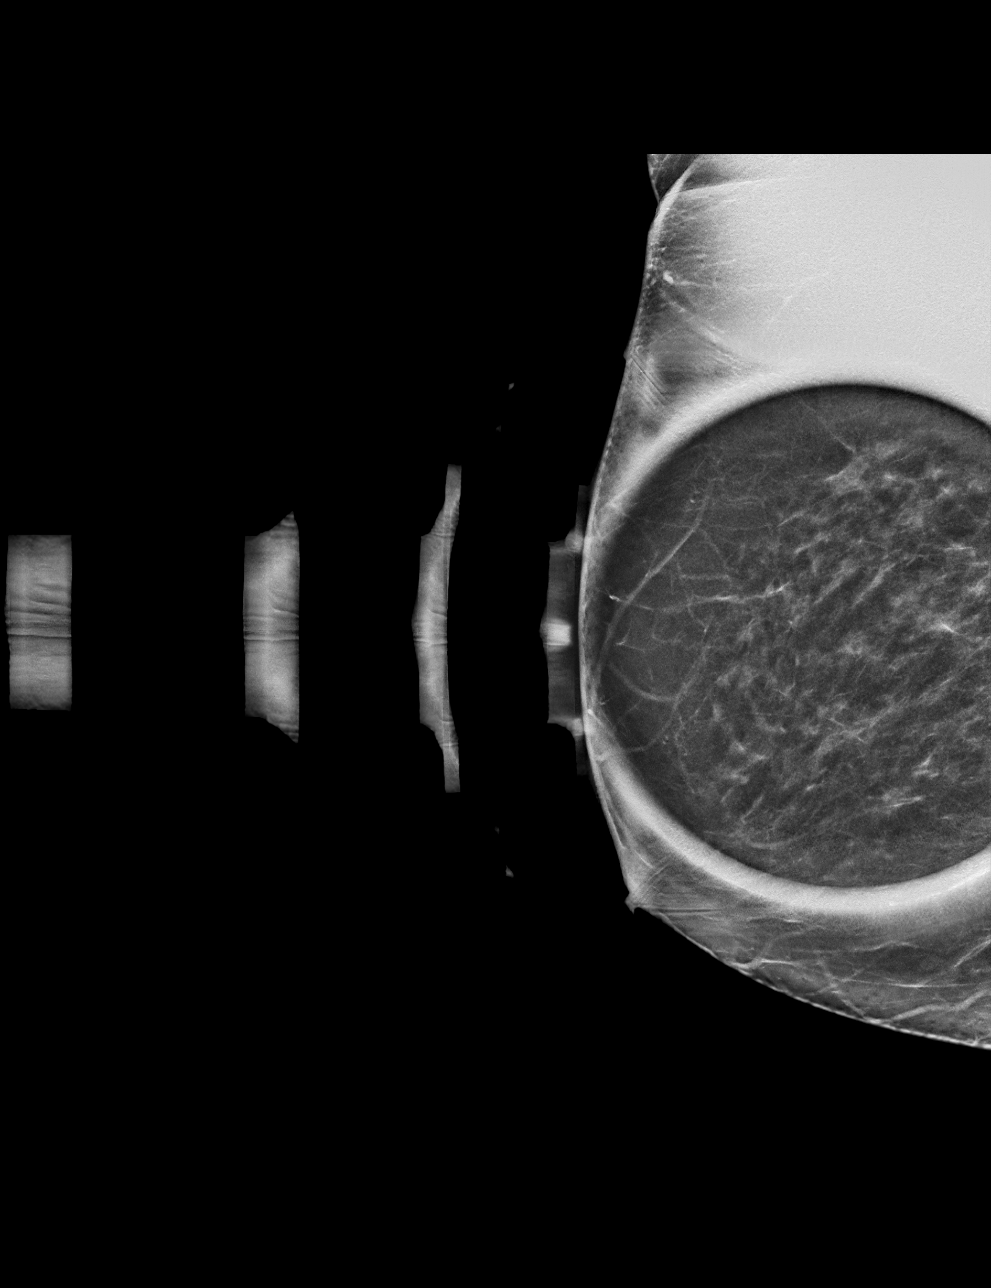

[R CC synth-2D]
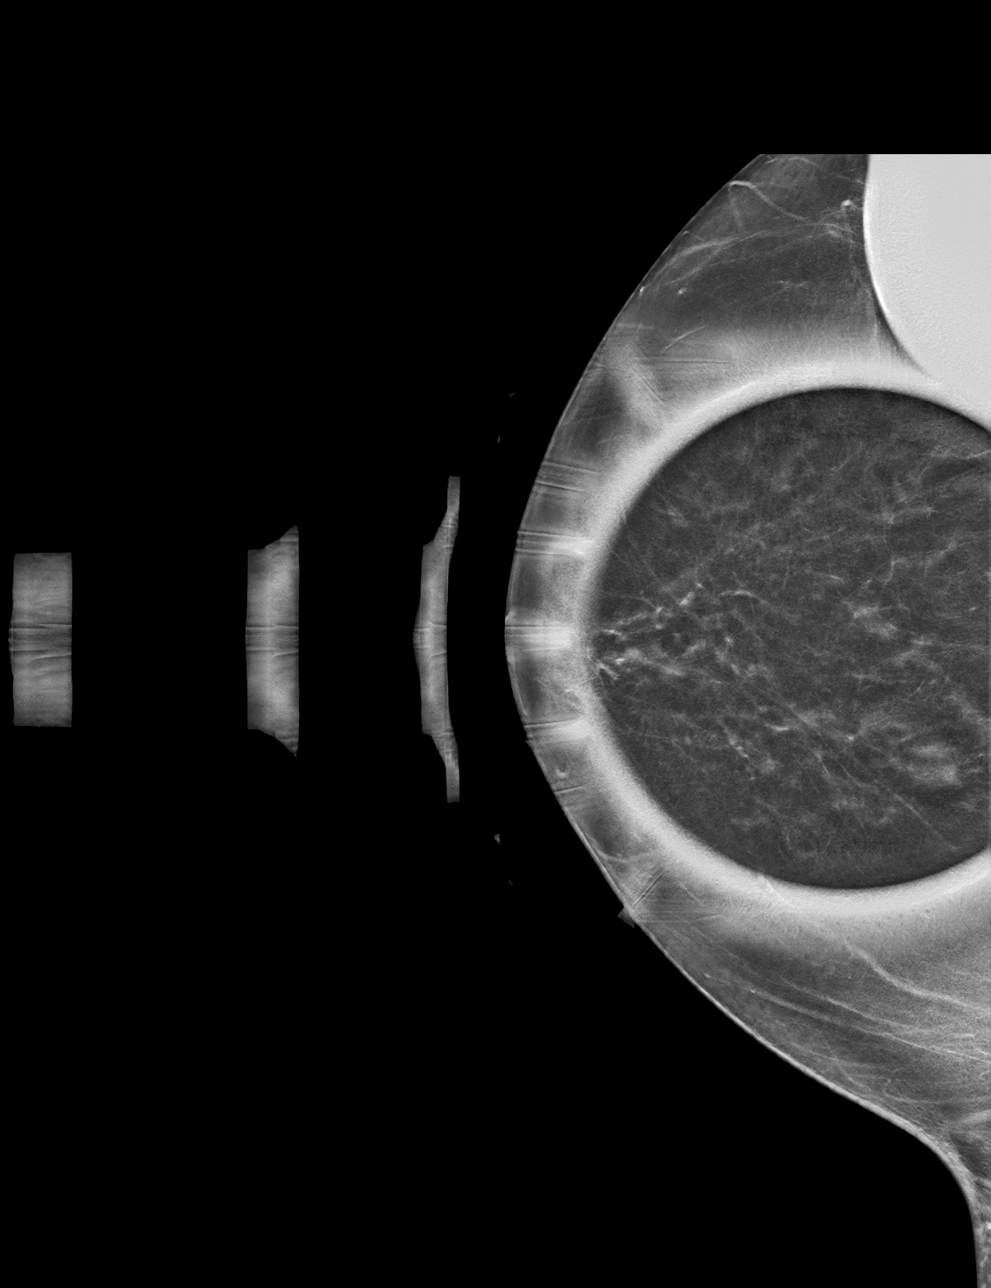

[R CC tomo · tomo slice 27/54.0]
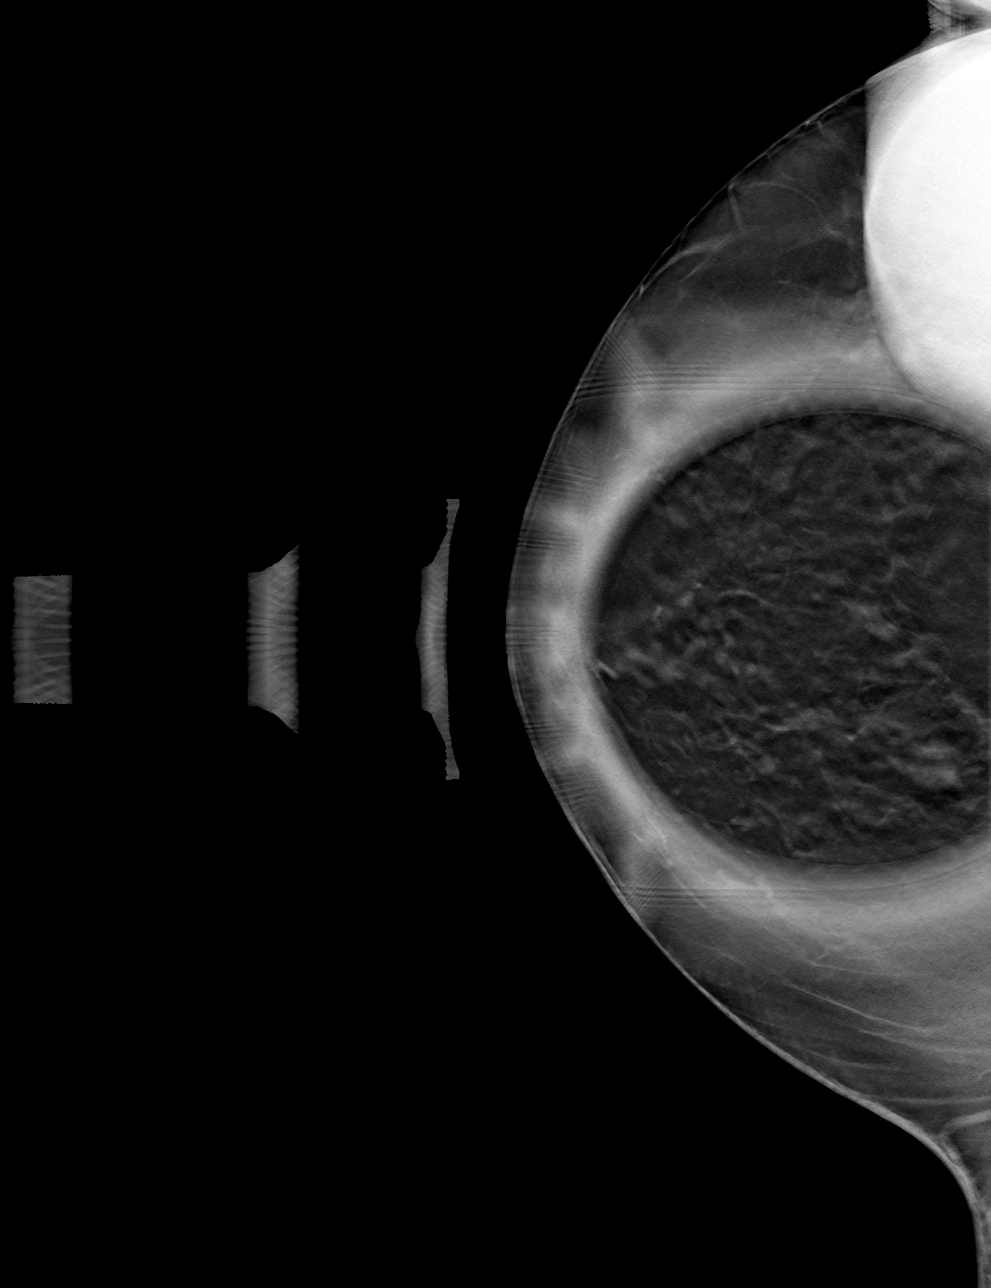

[R MLO tomo · tomo slice 29/56.0]
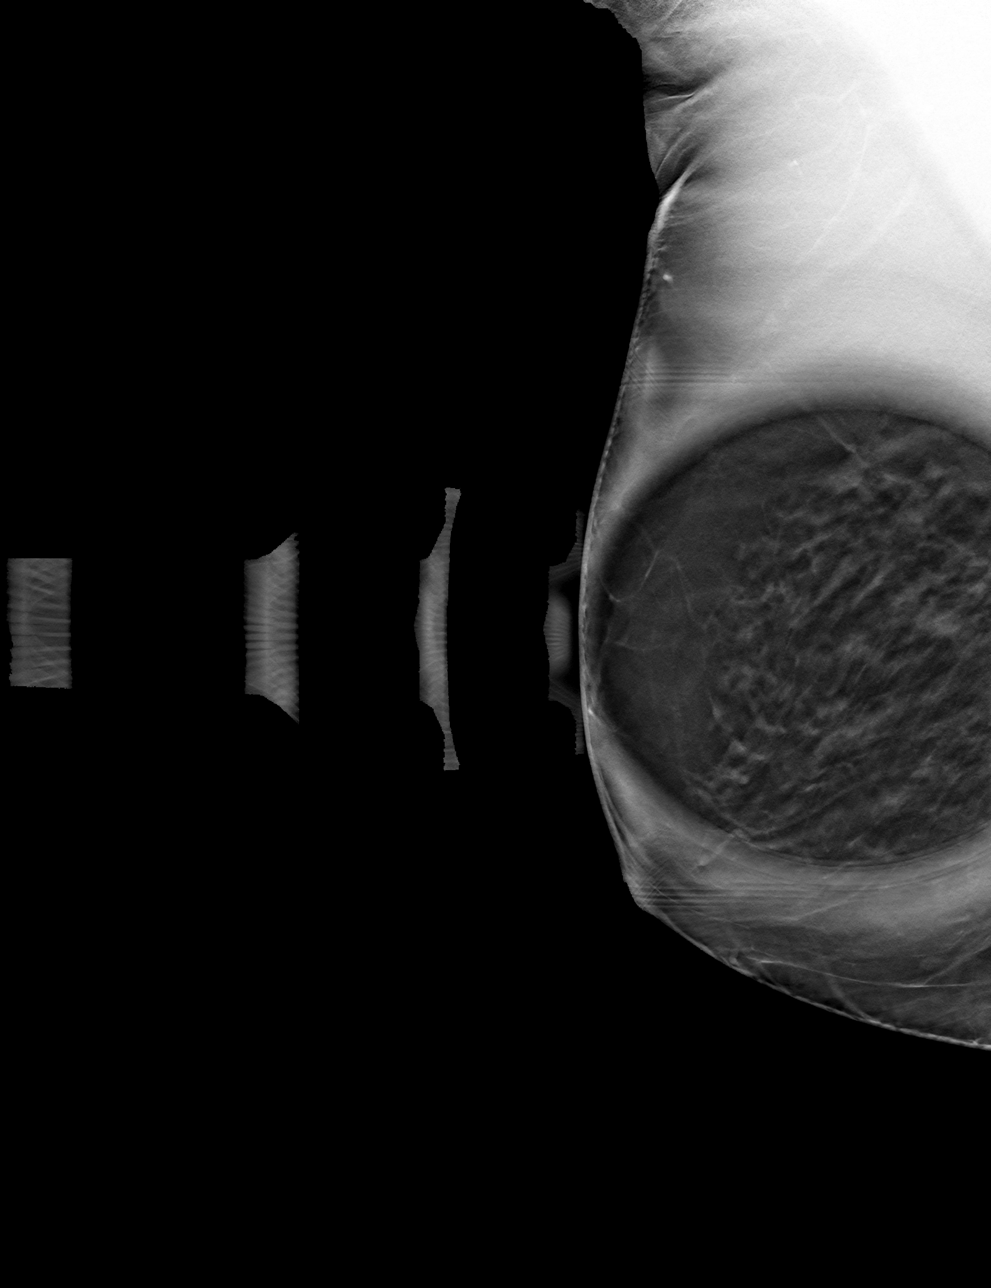

[4 of 12 positions shown; findings below may reference images not displayed]

ACR Breast Density Category b: There are scattered areas of
fibroglandular density.
FINDINGS: There is a persistent low-density oval mass within the medial right
breast posterior depth.

Targeted ultrasound is performed, showing a 9 x 7 x 3 mm cyst right
breast 2 o'clock position 3 cm from the nipple.
IMPRESSION: Right breast cyst.  No mammographic evidence for malignancy.

RECOMMENDATION:
Screening mammogram in one year.(Code:XX-P-EV2)

I have discussed the findings and recommendations with the patient.
If applicable, a reminder letter will be sent to the patient
regarding the next appointment.

BI-RADS CATEGORY  2: Benign.

## 2023-05-27 ENCOUNTER — Other Ambulatory Visit: Payer: Self-pay | Admitting: Internal Medicine

## 2023-05-27 DIAGNOSIS — Z1231 Encounter for screening mammogram for malignant neoplasm of breast: Secondary | ICD-10-CM

## 2023-06-29 ENCOUNTER — Ambulatory Visit: Payer: 59

## 2023-07-30 ENCOUNTER — Ambulatory Visit
Admission: RE | Admit: 2023-07-30 | Discharge: 2023-07-30 | Disposition: A | Discharge status: Home / Self Care | Payer: 59 | Source: Ambulatory Visit | Attending: Internal Medicine | Admitting: Internal Medicine

## 2023-07-30 DIAGNOSIS — Z1231 Encounter for screening mammogram for malignant neoplasm of breast: Secondary | ICD-10-CM

## 2023-09-05 ENCOUNTER — Other Ambulatory Visit: Payer: Self-pay | Admitting: Nurse Practitioner

## 2024-06-28 ENCOUNTER — Other Ambulatory Visit: Payer: Self-pay | Admitting: Internal Medicine

## 2024-06-28 DIAGNOSIS — Z1231 Encounter for screening mammogram for malignant neoplasm of breast: Secondary | ICD-10-CM

## 2024-08-01 ENCOUNTER — Ambulatory Visit
Admission: RE | Admit: 2024-08-01 | Discharge: 2024-08-01 | Disposition: A | Source: Ambulatory Visit | Attending: Internal Medicine | Admitting: Internal Medicine

## 2024-08-01 DIAGNOSIS — Z1231 Encounter for screening mammogram for malignant neoplasm of breast: Secondary | ICD-10-CM
